# Patient Record
Sex: Female | Born: 1975 | Race: White | Hispanic: No | Marital: Married | Smoking: Never smoker
Health system: Southern US, Community
[De-identification: ages and names within clinical notes are randomized; demographics above are authoritative.]

## PROBLEM LIST (undated history)

## (undated) DIAGNOSIS — Z872 Personal history of diseases of the skin and subcutaneous tissue: Secondary | ICD-10-CM

## (undated) DIAGNOSIS — E0590 Thyrotoxicosis, unspecified without thyrotoxic crisis or storm: Secondary | ICD-10-CM

## (undated) DIAGNOSIS — R102 Pelvic and perineal pain unspecified side: Secondary | ICD-10-CM

## (undated) DIAGNOSIS — M797 Fibromyalgia: Secondary | ICD-10-CM

## (undated) DIAGNOSIS — Q796 Ehlers-Danlos syndrome, unspecified: Secondary | ICD-10-CM

## (undated) DIAGNOSIS — Z87820 Personal history of traumatic brain injury: Secondary | ICD-10-CM

## (undated) DIAGNOSIS — F329 Major depressive disorder, single episode, unspecified: Secondary | ICD-10-CM

## (undated) DIAGNOSIS — J302 Other seasonal allergic rhinitis: Secondary | ICD-10-CM

## (undated) DIAGNOSIS — L709 Acne, unspecified: Secondary | ICD-10-CM

## (undated) DIAGNOSIS — F410 Panic disorder [episodic paroxysmal anxiety] without agoraphobia: Secondary | ICD-10-CM

## (undated) DIAGNOSIS — E282 Polycystic ovarian syndrome: Secondary | ICD-10-CM

## (undated) DIAGNOSIS — F419 Anxiety disorder, unspecified: Secondary | ICD-10-CM

## (undated) DIAGNOSIS — F32A Depression, unspecified: Secondary | ICD-10-CM

## (undated) DIAGNOSIS — G43909 Migraine, unspecified, not intractable, without status migrainosus: Secondary | ICD-10-CM

## (undated) DIAGNOSIS — N809 Endometriosis, unspecified: Secondary | ICD-10-CM

## (undated) DIAGNOSIS — N3010 Interstitial cystitis (chronic) without hematuria: Secondary | ICD-10-CM

## (undated) DIAGNOSIS — M26629 Arthralgia of temporomandibular joint, unspecified side: Secondary | ICD-10-CM

## (undated) DIAGNOSIS — M1990 Unspecified osteoarthritis, unspecified site: Secondary | ICD-10-CM

## (undated) HISTORY — DX: Ehlers-Danlos syndrome, unspecified: Q79.60

## (undated) HISTORY — DX: Major depressive disorder, single episode, unspecified: F32.9

## (undated) HISTORY — DX: Unspecified osteoarthritis, unspecified site: M19.90

## (undated) HISTORY — DX: Anxiety disorder, unspecified: F41.9

## (undated) HISTORY — DX: Migraine, unspecified, not intractable, without status migrainosus: G43.909

## (undated) HISTORY — DX: Endometriosis, unspecified: N80.9

## (undated) HISTORY — DX: Thyrotoxicosis, unspecified without thyrotoxic crisis or storm: E05.90

## (undated) HISTORY — DX: Fibromyalgia: M79.7

## (undated) HISTORY — DX: Depression, unspecified: F32.A

---
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
## 2002-05-29 HISTORY — PX: WISDOM TOOTH EXTRACTION: SHX21

## 2012-02-20 ENCOUNTER — Encounter: Payer: Self-pay | Admitting: Family Medicine

## 2012-02-20 ENCOUNTER — Telehealth: Payer: Self-pay | Admitting: Family Medicine

## 2012-02-20 ENCOUNTER — Ambulatory Visit (INDEPENDENT_AMBULATORY_CARE_PROVIDER_SITE_OTHER): Payer: BC Managed Care – PPO | Admitting: Family Medicine

## 2012-02-20 VITALS — BP 120/80 | Temp 98.4°F | Ht 63.75 in | Wt 177.0 lb

## 2012-02-20 DIAGNOSIS — IMO0002 Reserved for concepts with insufficient information to code with codable children: Secondary | ICD-10-CM

## 2012-02-20 DIAGNOSIS — Z299 Encounter for prophylactic measures, unspecified: Secondary | ICD-10-CM

## 2012-02-20 DIAGNOSIS — F329 Major depressive disorder, single episode, unspecified: Secondary | ICD-10-CM

## 2012-02-20 DIAGNOSIS — F3289 Other specified depressive episodes: Secondary | ICD-10-CM

## 2012-02-20 DIAGNOSIS — S39012A Strain of muscle, fascia and tendon of lower back, initial encounter: Secondary | ICD-10-CM

## 2012-02-20 DIAGNOSIS — Z23 Encounter for immunization: Secondary | ICD-10-CM

## 2012-02-20 DIAGNOSIS — F32A Depression, unspecified: Secondary | ICD-10-CM

## 2012-02-20 LAB — LIPID PANEL
Cholesterol: 141 mg/dL (ref 0–200)
HDL: 59.5 mg/dL (ref >39.00)

## 2012-02-20 LAB — BASIC METABOLIC PANEL
BUN: 13 mg/dL (ref 6–23)
GFR: 87.32 mL/min (ref >60.00)
Potassium: 5 mEq/L (ref 3.5–5.1)

## 2012-02-20 LAB — HEMOGLOBIN A1C: Hgb A1c MFr Bld: 5.1 % (ref 4.6–6.5)

## 2012-02-20 LAB — T4, FREE: Free T4: 0.86 ng/dL (ref 0.60–1.60)

## 2012-02-20 MED ORDER — ESCITALOPRAM OXALATE 10 MG PO TABS: 10.0000 mg | ORAL_TABLET | Freq: Every day | ORAL | Status: DC

## 2012-02-20 MED ORDER — DULOXETINE HCL 30 MG PO CPEP: 30.0000 mg | ORAL_CAPSULE | Freq: Two times a day (BID) | ORAL | Status: DC

## 2012-02-20 NOTE — Progress Notes (Signed)
Chief Complaint  Patient presents with  . Establish Care    acute back pain and chronic issues- need second opinion     HPI:  Casey Cox is here to establish care and has some back pain to discuss. Moved here about 1 year ago.   PMH: Menorrhagia/PMH:  -saw a gyn doctor for this and told too much estrogen and taking prometrium since. -multiple other issues for many years with this:weight difficult to control, difficulty sleeping, thin hair, sensitive to cold and heat, occ constipation, occ depression, occ anxiety, poor memory, puffiness around eyes, joint and muscle pains, back pain, mood swings, fatigue and many other symptoms -tx for lymes - but symptoms started prior to that -has seen a number of doctors and specialists including gyn, rheum, ortho and neurology with extensive workups and thought to possibly have fibromyalgia, migraines, elevated estrogen -these symptoms are ongoing and she does want a referral to gyn and wants basic labs here  Acute back pain: -started right after moving -bilaterally lower lumbar pain -not radiating -better in flexion, worse with sitting on hard surface or carrying children -denies: fever, weight, bowel or bladder incontinence, weakness or numbness in legs  Does nanny work full time.      ROS: See pertinent positives and negatives per HPI.  Past Medical History  Diagnosis Date  . Menorrhagia   . Patellofemoral syndrome   . Fibromyalgia   . Migraine   . Arthritis   . Depression   . Migraines   . UTI (urinary tract infection)     Family History  Problem Relation Age of Onset  . Fibromyalgia Mother   . Arthritis Mother   . Diabetes Father   . Arthritis Father   . Arthritis Other   . Cancer Other     breast, prostate  . Hyperlipidemia Other   . Heart disease Other   . Hypertension Other   . Mental illness Other   . Diabetes Other     History   Social History  . Marital Status: Single    Spouse Name: N/A    Number of  Children: N/A  . Years of Education: N/A   Social History Main Topics  . Smoking status: Never Smoker   . Smokeless tobacco: Never Used  . Alcohol Use: 0.6 oz/week    1 Glasses of wine per week     occ  . Drug Use: None  . Sexually Active: Yes   Other Topics Concern  . None   Social History Narrative  . None    Current outpatient prescriptions:escitalopram (LEXAPRO) 10 MG tablet, Take 1 tablet (10 mg total) by mouth daily., Disp: 30 tablet, Rfl: 3;  progesterone (PROMETRIUM) 100 MG capsule, Take 200 mg by mouth. Take two capsules by mouth on days 14-28 of cycle., Disp: , Rfl:   EXAM:  Filed Vitals:   02/20/12 1423  BP: 120/80  Temp: 98.4 F (36.9 C)    Body mass index is 30.62 kg/(m^2).  GENERAL: vitals reviewed and listed below, alert, oriented, appears well hydrated and in no acute distress  HEENT: atraumatic, conjucntiva clear, no obvious abnormalities on inspection of external nose and ears  NECK: no masses on inspection  MS: moves all extremities without noticeable abnormality Normal gait TTP with light touch paraspinal muscle throughout > in lumbar region - facet loading tests, normal ROM in flx/ext/sidebending lumbar and thoracic spine -no PSIS, bony or piriformis TTP -neg SLRT and CLRT bilat, Neg FABER and FADIR -nor strength,  reflexes and sensation to light touch in bilar LEs  PSYCH: pleasant and cooperative, somewhat depressed mood and anxious  ASSESSMENT AND PLAN:  Discussed the following assessment and plan:  1. Preventive measure  TSH, Basic Metabolic Panel, Vitamin D, 25-hydroxy, Lipid Panel, Hemoglobin A1c, T4, Free, Ambulatory referral to Gynecology, Flu vaccine greater than or equal to 3yo preservative free IM, Tdap vaccine greater than or equal to 7yo IM, Ambulatory referral to Physical Therapy  2. Back strain  PT, OTC medications, heat ice  3. Depression  SSRI, counselling   Spent >30 minutes with this patient in face to face  counseling Depressed mood without SI or HI and likely fibromyalgia given hx and exam. Tried adding cymbalta but too expensive and will try lexapro. Back Strain: heat, ice, OTC meds, PT  Advised counseling and pt to set up this appt. Getting regular exercise and trying to eat well. Basic labs today given multiple complaints and pt to have records sent to our office for previous studies. Referral to gyn per pt request - lesbian partner and wants to get pregnant and for management of her menorrhagia and annual well women exams. Flu and tetanus vaccines today. Follow up in one month.   Orders Placed This Encounter  Procedures  . Flu vaccine greater than or equal to 3yo preservative free IM  . Tdap vaccine greater than or equal to 7yo IM  . TSH  . Basic Metabolic Panel  . Vitamin D, 25-hydroxy  . Lipid Panel  . Hemoglobin A1c  . T4, Free  . Ambulatory referral to Gynecology    Referral Priority:  Routine    Referral Type:  Consultation    Referral Reason:  Specialty Services Required    Requested Specialty:  Gynecology    Number of Visits Requested:  1  . Ambulatory referral to Physical Therapy    Referral Priority:  Routine    Referral Type:  Physical Medicine    Referral Reason:  Specialty Services Required    Requested Specialty:  Physical Therapy    Number of Visits Requested:  1    Patient Instructions  -We have ordered labs for your at this visit. It usually takes 1-2 weeks for these to result and be processed. We will contact you with instructions if your results are abnormal. Normal results will be release to your [facility] in 1-2 weeks. If you have not heard from [place] or can not find your results in [facility] in 2 weeks please contact our office.  -We placed a referral for you as discussed. It usually takes about 1-2 weeks to process and schedule this referral. If you have not heard from [place] regarding this appointment in 2 weeks please contact our office.  -As we discussed, we  have prescribed a new medication for you at this appointment. We discussed the common and serious potential adverse effects of this medication and you can review these and more with the pharmacist when you pick up your medication.  Please follow the instructions for use carefully and notify us immediately if you have any problems taking this medication.  -follow up in 1-2   Thank you for enrolling in MyChart. Please follow the instructions below to securely access your online medical record. MyChart allows you to send messages to your doctor, view your test results, renew your prescriptions, schedule appointments, and more.  How Do I Sign Up? 1. In your Internet browser, go to [email or web address] 2. Click on the New  User?  link in the Sign In box.  3. Enter your MyChart Access Code exactly as it appears below. You will not need to use this code after you have completed the sign-up process. If you do not sign up before the expiration date, you must request a new code. MyChart Access Code: [email or web address] Expires: 03/21/2012  3:14 PM  4. Enter the last four digits of your Social Security Number (xxxx) and Date of Birth (mm/dd/yyyy) as indicated and click Next. You will be taken to the next sign-up page. 5. Create a MyChart ID. This will be your MyChart login ID and cannot be changed, so think of one that is secure and easy to remember. 6. Create a MyChart password. You can change your password at any time. 7. Enter your Password Reset Question and Answer and click Next. This can be used at a later time if you forget your password.  8. Select your communication preference, and if applicable enter your e-mail address. You will receive e-mail notification when new information is available in MyChart by choosing to receive e-mail notifications and filling in your e-mail. 9. Click Sign In. You can now view your medical record.   Additional Information If you have questions, you can  email [email or web address] or call [phone] to talk to our MyChart staff. Remember, MyChart is NOT to be used for urgent needs. For medical emergencies, dial 911.            Return to clinic immediately if symptoms worsen or persist or new concerns.  No Follow-up on file.  Kriste Basque R.

## 2012-02-20 NOTE — Telephone Encounter (Signed)
Lets try another similar medication. I have sent another Rx. Thanks!  Casey Cox

## 2012-02-20 NOTE — Telephone Encounter (Signed)
Pt called and said that the DULoxetine (CYMBALTA) 30 MG capsule was $275 with pts insurance. Med is too expensive. Pt did not get med. Needs cheaper alternative. [facility] on [address]

## 2012-02-20 NOTE — Patient Instructions (Addendum)
-  We have ordered labs for your at this visit. It usually takes 1-2 weeks for these to result and be processed. We will contact you with instructions if your results are abnormal. Normal results will be release to your [facility] in 1-2 weeks. If you have not heard from [place] or can not find your results in [facility] in 2 weeks please contact our office.  -We placed a referral for you as discussed. It usually takes about 1-2 weeks to process and schedule this referral. If you have not heard from [place] regarding this appointment in 2 weeks please contact our office.  -As we discussed, we have prescribed a new medication for you at this appointment. We discussed the common and serious potential adverse effects of this medication and you can review these and more with the pharmacist when you pick up your medication.  Please follow the instructions for use carefully and notify us immediately if you have any problems taking this medication.  -follow up in 1-2   Thank you for enrolling in MyChart. Please follow the instructions below to securely access your online medical record. MyChart allows you to send messages to your doctor, view your test results, renew your prescriptions, schedule appointments, and more.  How Do I Sign Up? 1. In your Internet browser, go to [email or web address] 2. Click on the New  User? link in the Sign In box.  3. Enter your MyChart Access Code exactly as it appears below. You will not need to use this code after you have completed the sign-up process. If you do not sign up before the expiration date, you must request a new code. MyChart Access Code: [email or web address] Expires: 03/21/2012  3:14 PM  4. Enter the last four digits of your Social Security Number (xxxx) and Date of Birth (mm/dd/yyyy) as indicated and click Next. You will be taken to the next sign-up page. 5. Create a MyChart ID. This will be your MyChart login ID and cannot be changed, so think of one that is  secure and easy to remember. 6. Create a MyChart password. You can change your password at any time. 7. Enter your Password Reset Question and Answer and click Next. This can be used at a later time if you forget your password.  8. Select your communication preference, and if applicable enter your e-mail address. You will receive e-mail notification when new information is available in MyChart by choosing to receive e-mail notifications and filling in your e-mail. 9. Click Sign In. You can now view your medical record.   Additional Information If you have questions, you can email [email or web address] or call [phone] to talk to our MyChart staff. Remember, MyChart is NOT to be used for urgent needs. For medical emergencies, dial 911.

## 2012-02-20 NOTE — Telephone Encounter (Signed)
Pls advise.  

## 2012-02-21 LAB — TSH: TSH: 0.47 u[IU]/mL (ref 0.35–5.50)

## 2012-02-21 LAB — VITAMIN D 25 HYDROXY (VIT D DEFICIENCY, FRACTURES): Vit D, 25-Hydroxy: 27 ng/mL — ABNORMAL LOW (ref 30–89)

## 2012-02-21 NOTE — Telephone Encounter (Signed)
Called pt to make aware that new rx had been sent to pharmacy.

## 2012-03-21 ENCOUNTER — Ambulatory Visit (INDEPENDENT_AMBULATORY_CARE_PROVIDER_SITE_OTHER): Payer: BC Managed Care – PPO | Admitting: Family Medicine

## 2012-03-21 ENCOUNTER — Encounter: Payer: Self-pay | Admitting: Family Medicine

## 2012-03-21 VITALS — BP 118/80 | HR 87 | Temp 98.3°F | Wt 175.0 lb

## 2012-03-21 DIAGNOSIS — F32A Depression, unspecified: Secondary | ICD-10-CM

## 2012-03-21 DIAGNOSIS — R5383 Other fatigue: Secondary | ICD-10-CM

## 2012-03-21 DIAGNOSIS — M797 Fibromyalgia: Secondary | ICD-10-CM

## 2012-03-21 DIAGNOSIS — F329 Major depressive disorder, single episode, unspecified: Secondary | ICD-10-CM

## 2012-03-21 DIAGNOSIS — F3289 Other specified depressive episodes: Secondary | ICD-10-CM

## 2012-03-21 DIAGNOSIS — IMO0001 Reserved for inherently not codable concepts without codable children: Secondary | ICD-10-CM

## 2012-03-21 DIAGNOSIS — M26609 Unspecified temporomandibular joint disorder, unspecified side: Secondary | ICD-10-CM

## 2012-03-21 DIAGNOSIS — A35 Other tetanus: Secondary | ICD-10-CM

## 2012-03-21 DIAGNOSIS — R5381 Other malaise: Secondary | ICD-10-CM

## 2012-03-21 LAB — CBC WITH DIFFERENTIAL/PLATELET
Basophils Absolute: 0 10*3/uL (ref 0.0–0.1)
Eosinophils Absolute: 0.2 10*3/uL (ref 0.0–0.7)
Lymphocytes Relative: 28 % (ref 12.0–46.0)
MCHC: 33.2 g/dL (ref 30.0–36.0)
MCV: 92.7 fl (ref 78.0–100.0)
Monocytes Absolute: 0.5 10*3/uL (ref 0.1–1.0)
Neutrophils Relative %: 60.9 % (ref 43.0–77.0)
Platelets: 239 10*3/uL (ref 150.0–400.0)
RDW: 12.6 % (ref 11.5–14.6)

## 2012-03-21 NOTE — Patient Instructions (Addendum)
Insomnia: -cool dark room room reserved only for sleeping  -dont toss or turn for greater then 15-20 minutes -if tossing and turning - get up, journal thoughts, read abook (in another room) or do a light task and then attempt to go to bed again, repeat as many times as needed -wake up at the same time every day - even when you don't feel like it -try 1-5 mg of melatonin nightly 1/2 hour before bedtime or valerian root  -continue Lexapro at same dose  -counseling when can afford it  -take 2000mg  vitamin D3 daily  -We placed a referral for you for your jaw as discussed. It usually takes about 1-2 weeks to process and schedule this referral. If you have not heard from [place] regarding this appointment in 2 weeks please contact our office.  -follow up in 2 months

## 2012-03-21 NOTE — Progress Notes (Signed)
Chief Complaint  Patient presents with  . Follow-up    HPI: Follow up depression/likely fibromyalgia: -started lexapro last visit about 1 month ago -got viral upper resp infection after last visit -back feels better -has not seen counselor due to financial reasons -BMP, TSH, lipids, hemoglobin A1c all normal, Vit D low -CBC not checked  -denies: SI, fevers, weight loss, chills  Insomnia: -chronic, worsening -difficulty falling asleep and daytime drowsiness -no apnea, syncope, fevers, chills, snoring  TMD: -jaw locks - sometimes for 20 minutes -crunching and cracking sounds when opening her mouth -has a problem with clenching her jaw in the mornings - this seems to have increased a little - has seen multiple dentists and dental surgeons about this, but told this is just bad  TMJ  ROS: See pertinent positives and negatives per HPI.  Past Medical History  Diagnosis Date  . Menorrhagia   . Patellofemoral syndrome   . Fibromyalgia   . Migraine   . Arthritis   . Depression   . Migraines   . UTI (urinary tract infection)     Family History  Problem Relation Age of Onset  . Fibromyalgia Mother   . Arthritis Mother   . Diabetes Father   . Arthritis Father   . Arthritis Other   . Cancer Other     breast, prostate  . Hyperlipidemia Other   . Heart disease Other   . Hypertension Other   . Mental illness Other   . Diabetes Other     History   Social History  . Marital Status: Single    Spouse Name: N/A    Number of Children: N/A  . Years of Education: N/A   Social History Main Topics  . Smoking status: Never Smoker   . Smokeless tobacco: Never Used  . Alcohol Use: 0.6 oz/week    1 Glasses of wine per week     occ  . Drug Use: None  . Sexually Active: Yes   Other Topics Concern  . None   Social History Narrative  . None    Current outpatient prescriptions:escitalopram (LEXAPRO) 10 MG tablet, Take 1 tablet (10 mg total) by mouth daily., Disp: 30 tablet,  Rfl: 3;  Multiple Vitamin (MULTIVITAMIN) tablet, Take 1 tablet by mouth daily., Disp: , Rfl: ;  Omega-3 Fatty Acids (FISH OIL) 1200 MG CAPS, Take 2,400 mg by mouth daily., Disp: , Rfl: ;  progesterone (PROMETRIUM) 100 MG capsule, Take 200 mg by mouth. Take two capsules by mouth on days 14-28 of cycle., Disp: , Rfl:  vitamin B-12 (CYANOCOBALAMIN) 500 MCG tablet, Take 500 mcg by mouth daily., Disp: , Rfl:   EXAM:  Filed Vitals:   03/21/12 0856  BP: 118/80  Pulse: 87  Temp: 98.3 F (36.8 C)    There is no height on file to calculate BMI.  GENERAL: vitals reviewed and listed above, alert, oriented, appears well hydrated and in no acute distress  HEENT: atraumatic, conjunttiva clear, no obvious abnormalities on inspection of external nose and ears  NECK: no obvious masses on inspection  MS: moves all extremities without noticeable abnormality -crepitus TMJ joints and difficulty opening mouth  PSYCH: pleasant and cooperative, no obvious depression or anxiety - mood seems somewhat improved  ASSESSMENT AND PLAN:  Discussed the following assessment and plan:  1. Fatigue  CBC with Differential  2. Temporal mandibular joint disorder  Ambulatory referral to Oral Maxillofacial Surgery  3. Lock jaw  Ambulatory referral to Oral Maxillofacial Surgery  4. Depression    5. Fibromyalgia     -continue lexapro, see recs below -check CBC -counseling and regular exercise -referral for locking jaw and TMD -follow up in 2 months -Patient advised to return or notify a doctor immediately if symptoms worsen or persist or new concerns arise.  Patient Instructions  Insomnia: -cool dark room room reserved only for sleeping  -dont toss or turn for greater then 15-20 minutes -if tossing and turning - get up, journal thoughts, read abook (in another room) or do a light task and then attempt to go to bed again, repeat as many times as needed -wake up at the same time every day - even when you don't feel  like it -try 1-5 mg of melatonin nightly 1/2 hour before bedtime or valerian root  -continue Lexapro at same dose  -counseling when can afford it  -take 2000mg  vitamin D3 daily  -We placed a referral for you for your jaw as discussed. It usually takes about 1-2 weeks to process and schedule this referral. If you have not heard from [place] regarding this appointment in 2 weeks please contact our office.  -follow up in 2 months       [name]

## 2012-03-22 ENCOUNTER — Ambulatory Visit: Payer: BC Managed Care – PPO | Admitting: Family Medicine

## 2012-03-28 ENCOUNTER — Other Ambulatory Visit: Payer: Self-pay | Admitting: Family Medicine

## 2012-03-28 DIAGNOSIS — A35 Other tetanus: Secondary | ICD-10-CM

## 2012-03-28 DIAGNOSIS — M26609 Unspecified temporomandibular joint disorder, unspecified side: Secondary | ICD-10-CM

## 2012-04-02 ENCOUNTER — Telehealth: Payer: Self-pay | Admitting: Family Medicine

## 2012-04-02 NOTE — Telephone Encounter (Signed)
Left a message for pt to return call. Spoke with Joni Reining about referral and she states that when she called to make the appt she specifically noted that the appt was for TMJ.  Dr. Barbette Merino [phone] )treats TMJ and a referral can be placed if pt would like.

## 2012-04-02 NOTE — Telephone Encounter (Signed)
Pt called and said that she went to ENT referral appt and was charged her spec copay, but was told that they do not treat TMJ. Pt was dx with TMJ, needs a different referral to a spec that treats the condition. Pls call.

## 2012-04-03 ENCOUNTER — Other Ambulatory Visit: Payer: Self-pay | Admitting: Family Medicine

## 2012-04-03 DIAGNOSIS — A35 Other tetanus: Secondary | ICD-10-CM

## 2012-04-03 DIAGNOSIS — S0300XA Dislocation of jaw, unspecified side, initial encounter: Secondary | ICD-10-CM

## 2012-04-03 NOTE — Telephone Encounter (Signed)
New referral ordered

## 2012-04-04 ENCOUNTER — Encounter: Payer: Self-pay | Admitting: Family Medicine

## 2012-04-04 ENCOUNTER — Telehealth: Payer: Self-pay | Admitting: Family Medicine

## 2012-04-04 ENCOUNTER — Ambulatory Visit (INDEPENDENT_AMBULATORY_CARE_PROVIDER_SITE_OTHER): Payer: BC Managed Care – PPO | Admitting: Family Medicine

## 2012-04-04 VITALS — BP 100/70 | HR 137 | Temp 98.4°F | Wt 172.0 lb

## 2012-04-04 DIAGNOSIS — K5289 Other specified noninfective gastroenteritis and colitis: Secondary | ICD-10-CM

## 2012-04-04 DIAGNOSIS — E860 Dehydration: Secondary | ICD-10-CM

## 2012-04-04 DIAGNOSIS — G43909 Migraine, unspecified, not intractable, without status migrainosus: Secondary | ICD-10-CM | POA: Insufficient documentation

## 2012-04-04 DIAGNOSIS — K529 Noninfective gastroenteritis and colitis, unspecified: Secondary | ICD-10-CM

## 2012-04-04 DIAGNOSIS — M26609 Unspecified temporomandibular joint disorder, unspecified side: Secondary | ICD-10-CM | POA: Insufficient documentation

## 2012-04-04 DIAGNOSIS — M797 Fibromyalgia: Secondary | ICD-10-CM | POA: Insufficient documentation

## 2012-04-04 MED ORDER — ONDANSETRON HCL 4 MG PO TABS: 4.0000 mg | ORAL_TABLET | Freq: Three times a day (TID) | ORAL | Status: DC | PRN

## 2012-04-04 NOTE — Telephone Encounter (Signed)
Caller: [name]/Spouse; Patient Name: Casey Cox; PCP: Kriste Basque [facility]); Best Callback Phone Number: [phone].  Caller reports abdominal pain that began at 6pm on 04/03/12.  Pt also has nausea, vomiting, and diarrhea.  Caller could not estimate the number of times pt has vomited. Caller states pt hasnt kept anything down for 12hours. Pt is afebrile and  Pt has told caller that her abdominal pain is severe.  Emergent symptom of 'Unbearable abdominal/pelvic pain' identified in the Nausea or Vomiting Protocol. ED Disposition; however,  Office is open and RN able to schedule appt with provider within 40 minutes.  Appt scheduled for 9:00 with Dr Selena Batten (04/04/12).

## 2012-04-04 NOTE — Progress Notes (Signed)
Chief Complaint  Patient presents with  . severe abdominal pain    diarrhea, vomiting; not able to keep food or liquids down.     HPI:  Nausea, Vomiting, Diarrhea: -started last night -vomited multiple times, multiple episodes of watery diarrhea, severe intermittent cramping abdominal pain difuse -no fever, hematochezia or hematemesis, CP, SOB, rash, weight change -mother had exact same symptoms last week  -worse/better with: nothing  ROS: See pertinent positives and negatives per HPI.  Past Medical History  Diagnosis Date  . Menorrhagia   . Patellofemoral syndrome   . Fibromyalgia   . Migraine   . Arthritis   . Depression   . Migraines   . UTI (urinary tract infection)     Family History  Problem Relation Age of Onset  . Fibromyalgia Mother   . Arthritis Mother   . Diabetes Father   . Arthritis Father   . Arthritis Other   . Cancer Other     breast, prostate  . Hyperlipidemia Other   . Heart disease Other   . Hypertension Other   . Mental illness Other   . Diabetes Other     History   Social History  . Marital Status: Single    Spouse Name: N/A    Number of Children: N/A  . Years of Education: N/A   Social History Main Topics  . Smoking status: Never Smoker   . Smokeless tobacco: Never Used  . Alcohol Use: 0.6 oz/week    1 Glasses of wine per week     Comment: occ  . Drug Use: None  . Sexually Active: Yes   Other Topics Concern  . None   Social History Narrative  . None    Current outpatient prescriptions:escitalopram (LEXAPRO) 10 MG tablet, Take 1 tablet (10 mg total) by mouth daily., Disp: 30 tablet, Rfl: 3;  Multiple Vitamin (MULTIVITAMIN) tablet, Take 1 tablet by mouth daily., Disp: , Rfl: ;  Omega-3 Fatty Acids (FISH OIL) 1200 MG CAPS, Take 2,400 mg by mouth daily., Disp: , Rfl: ;  progesterone (PROMETRIUM) 100 MG capsule, Take 200 mg by mouth. Take two capsules by mouth on days 14-28 of cycle., Disp: , Rfl:  vitamin B-12 (CYANOCOBALAMIN)  500 MCG tablet, Take 500 mcg by mouth daily., Disp: , Rfl:   EXAM:  Filed Vitals:   04/04/12 0907  BP: 100/70  Pulse: 137  Temp: 98.4 F (36.9 C)    There is no height on file to calculate BMI.  GENERAL: vitals reviewed and listed above, alert, oriented, appears well hydrated and in no acute distress  HEENT: atraumatic, conjunttiva clear, no obvious abnormalities on inspection of external nose and ears  NECK: no obvious masses on inspection  LUNGS: clear to auscultation bilaterally, no wheezes, rales or rhonchi, good air movement  CV: HRRR, no peripheral edema  ABD: BS+, soft, no rebound or guarding, diffuse TTP  MS: moves all extremities without noticeable abnormality  PSYCH: pleasant and cooperative, no obvious depression or anxiety  ASSESSMENT AND PLAN:  Discussed the following assessment and plan: Gastroenteritis Dehydration  -discussed likely gastroenteritis and other less likely etiologies  -pt prefers not to go to ED and feel reasonable given contact with others with same symptoms and symptoms c/w gastroenteritis -1 L IVF with IM phenergan in office and patient felt better and wanted to go home -supportive tx at home with return and ED precuations   -Patient advised to return or notify a doctor immediately if symptoms worsen or persist  or new concerns arise.  There are no Patient Instructions on file for this visit.   Kriste BasqueKIM, Destyne Goodreau R.

## 2012-04-04 NOTE — Patient Instructions (Addendum)
-  drink plenty of fluids even if you feel sick - sip slowly all day  -take zofran as directed if needed  -can use loperamide for the diarrhea  -follow up if worsening or not improving

## 2012-04-09 ENCOUNTER — Encounter: Payer: Self-pay | Admitting: Family Medicine

## 2012-05-23 ENCOUNTER — Other Ambulatory Visit: Payer: Self-pay | Admitting: Oral Surgery

## 2012-05-23 DIAGNOSIS — M2669 Other specified disorders of temporomandibular joint: Secondary | ICD-10-CM

## 2012-05-24 ENCOUNTER — Other Ambulatory Visit: Payer: BC Managed Care – PPO

## 2012-05-26 ENCOUNTER — Other Ambulatory Visit: Payer: BC Managed Care – PPO

## 2012-05-29 DIAGNOSIS — M1990 Unspecified osteoarthritis, unspecified site: Secondary | ICD-10-CM

## 2012-05-29 HISTORY — DX: Unspecified osteoarthritis, unspecified site: M19.90

## 2012-06-03 ENCOUNTER — Other Ambulatory Visit: Payer: BC Managed Care – PPO

## 2012-06-06 ENCOUNTER — Inpatient Hospital Stay: Admission: RE | Admit: 2012-06-06 | Payer: BC Managed Care – PPO | Source: Ambulatory Visit

## 2012-06-11 ENCOUNTER — Ambulatory Visit
Admission: RE | Admit: 2012-06-11 | Discharge: 2012-06-11 | Disposition: A | Discharge status: Home / Self Care | Payer: BC Managed Care – PPO | Source: Ambulatory Visit | Attending: Oral Surgery | Admitting: Oral Surgery

## 2012-06-11 DIAGNOSIS — M2669 Other specified disorders of temporomandibular joint: Secondary | ICD-10-CM

## 2012-06-15 ENCOUNTER — Other Ambulatory Visit: Payer: Self-pay | Admitting: Family Medicine

## 2012-06-16 NOTE — Telephone Encounter (Signed)
Ok to refill for 1 month - advise follow up appt for further refills. Thanks.

## 2012-06-24 ENCOUNTER — Encounter: Payer: Self-pay | Admitting: Family Medicine

## 2012-06-24 ENCOUNTER — Ambulatory Visit (INDEPENDENT_AMBULATORY_CARE_PROVIDER_SITE_OTHER): Payer: BC Managed Care – PPO | Admitting: Family Medicine

## 2012-06-24 VITALS — BP 110/80 | HR 87 | Temp 98.0°F | Wt 177.0 lb

## 2012-06-24 DIAGNOSIS — F32A Depression, unspecified: Secondary | ICD-10-CM

## 2012-06-24 DIAGNOSIS — M797 Fibromyalgia: Secondary | ICD-10-CM

## 2012-06-24 DIAGNOSIS — F329 Major depressive disorder, single episode, unspecified: Secondary | ICD-10-CM

## 2012-06-24 DIAGNOSIS — M26609 Unspecified temporomandibular joint disorder, unspecified side: Secondary | ICD-10-CM

## 2012-06-24 DIAGNOSIS — F3289 Other specified depressive episodes: Secondary | ICD-10-CM

## 2012-06-24 DIAGNOSIS — IMO0001 Reserved for inherently not codable concepts without codable children: Secondary | ICD-10-CM

## 2012-06-24 MED ORDER — ESCITALOPRAM OXALATE 5 MG PO TABS: ORAL_TABLET | ORAL | Status: DC

## 2012-06-24 NOTE — Progress Notes (Signed)
Chief Complaint  Patient presents with  . Follow-up    stressed and anxious     HPI:  Follow up MMP:  Depression/?fibromyalgia/Insomnia: -per her report extensive workup in the past by rheum and neuro -started on lexapro last visit and recs for counseling -labs including BMP, TSH, lipids, CBC and Hemoglobin A1c normal, Vit D was low -recs for sleep hygeine, melatonin, counseling, exercise, 2000 IU VD3 daily 2.5 months ago -does have some anxiety about her jaw pain and locking -pt reports: depression somewhat improved, anxiety worse with her jaw, hurts all over - once per week - walking and biking daily   TMJ: -saw Dr. 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  Past Medical History  Diagnosis Date  . Menorrhagia   . Patellofemoral syndrome   . Fibromyalgia   . Migraine   . Arthritis   . Depression   . Migraines   . UTI (urinary tract infection)     Family History  Problem Relation Age of Onset  . Fibromyalgia Mother   . Arthritis Mother   . Diabetes Father   . Arthritis Father   . Arthritis Other   . Cancer Other     breast, prostate  . Hyperlipidemia Other   . Heart disease Other   . Hypertension Other   . Mental illness Other   . Diabetes Other     History   Social History  . Marital Status: Single    Spouse Name: N/A    Number of Children: N/A  . Years of Education: N/A   Social History Main Topics  . Smoking status: Never Smoker   . Smokeless tobacco: Never Used  . Alcohol Use: 0.6 oz/week    1 Glasses of wine per week     Comment: occ  . Drug Use: None  . Sexually Active: Yes   Other Topics Concern  . None   Social History Narrative   Work or School: full time [employer] Situation: lives with female partner - trying for a childSpiritual Beliefs:Lifestyle:    Current outpatient prescriptions:cholecalciferol (VITAMIN D) 1000 UNITS tablet, Take 1,000 Units by  mouth daily., Disp: , Rfl: ;  escitalopram (LEXAPRO) 10 MG tablet, TAKE ONE TABLET BY MOUTH ONE TIME DAILY, Disp: 30 tablet, Rfl: 1;  Multiple Vitamin (MULTIVITAMIN) tablet, Take 1 tablet by mouth daily., Disp: , Rfl: ;  Omega-3 Fatty Acids (FISH OIL) 1200 MG CAPS, Take 2,400 mg by mouth daily., Disp: , Rfl:  ondansetron (ZOFRAN) 4 MG tablet, Take 1 tablet (4 mg total) by mouth every 8 (eight) hours as needed for nausea., Disp: 20 tablet, Rfl: 0;  progesterone (PROMETRIUM) 100 MG capsule, Take 200 mg by mouth. Take two capsules by mouth on days 14-28 of cycle., Disp: , Rfl: ;  vitamin B-12 (CYANOCOBALAMIN) 500 MCG tablet, Take 500 mcg by mouth daily., Disp: , Rfl:   EXAM:  Filed Vitals:   06/24/12 1406  BP: 110/80  Pulse: 87  Temp: 98 F (36.7 C)    There is no height on file to calculate BMI.  GENERAL: vitals reviewed and listed above, alert, oriented, appears well hydrated and in no acute distress  HEENT: atraumatic, conjunttiva clear, no obvious abnormalities on inspection of external nose and ears  NECK: no obvious masses on inspection  LUNGS: clear to auscultation bilaterally, no wheezes, rales or rhonchi, good air movement  CV: HRRR, no peripheral edema  MS: moves all extremities without noticeable abnormality  PSYCH: pleasant and cooperative, no obvious depression or anxiety  ASSESSMENT AND PLAN:  Discussed the following assessment and plan:  1. Fibromyalgia  Ambulatory referral to Rheumatology  2. Depression    3. TMJ (temporomandibular joint syndrome)     -pt is frustrated with her symptoms of pain all over once per week and per prior workups likely fibromyalgia. She would like to see rheum as planning to get pregnant and wants to discuss tx options - referral placed.  -weaning off SSRI and melatonin as going to start trying to get pregnant - seeing ob/gyn -not many medication options at this time since going to get pregant -advised continued regular exercise, healthy  anti-inflammatory diet, counselin -Patient advised to return or notify a doctor immediately if symptoms worsen or persist or new concerns arise.  There are no Patient Instructions on file for this visit.   Kriste Basque R.

## 2012-06-24 NOTE — Patient Instructions (Addendum)
-  wean off lexapro as instructed  -stop melatonin  -see counselor  -We placed a referral for you as discussed to the Rheumatologist. It usually takes about 1-2 weeks to process and schedule this referral. If you have not heard from [place] regarding this appointment in 2 weeks please contact our office.   -follow up in 3-4 months

## 2012-07-04 HISTORY — PX: OTHER SURGICAL HISTORY: SHX169

## 2012-07-15 ENCOUNTER — Other Ambulatory Visit: Payer: Self-pay | Admitting: Family Medicine

## 2012-07-15 NOTE — Telephone Encounter (Signed)
We had tapered off this medication at her last visit as she had plans to get pregnant.

## 2012-08-09 ENCOUNTER — Encounter: Payer: Self-pay | Admitting: Family Medicine

## 2012-08-09 NOTE — Progress Notes (Signed)
Received OV note from Dr. Kellie Simmering in Rhematology. Pt seen 07/29/2012. Dx: fibromyalgia, Insomnia  Tx: Xanax at night and follow up in 5 weeks.

## 2012-09-04 ENCOUNTER — Encounter: Payer: Self-pay | Admitting: Obstetrics and Gynecology

## 2012-09-04 ENCOUNTER — Ambulatory Visit (INDEPENDENT_AMBULATORY_CARE_PROVIDER_SITE_OTHER): Payer: BC Managed Care – PPO | Admitting: Obstetrics and Gynecology

## 2012-09-04 ENCOUNTER — Inpatient Hospital Stay
Admission: RE | Admit: 2012-09-04 | Discharge: 2012-09-04 | Disposition: A | Discharge status: Home / Self Care | Payer: Self-pay | Source: Ambulatory Visit | Attending: Obstetrics and Gynecology | Admitting: Obstetrics and Gynecology

## 2012-09-04 VITALS — BP 124/70 | Ht 63.0 in | Wt 183.0 lb

## 2012-09-04 DIAGNOSIS — Z0000 Encounter for general adult medical examination without abnormal findings: Secondary | ICD-10-CM

## 2012-09-04 DIAGNOSIS — N920 Excessive and frequent menstruation with regular cycle: Secondary | ICD-10-CM

## 2012-09-04 DIAGNOSIS — Z01419 Encounter for gynecological examination (general) (routine) without abnormal findings: Secondary | ICD-10-CM

## 2012-09-04 DIAGNOSIS — R635 Abnormal weight gain: Secondary | ICD-10-CM

## 2012-09-04 LAB — POCT URINALYSIS DIPSTICK
Glucose, UA: NEGATIVE
Urobilinogen, UA: NEGATIVE

## 2012-09-04 NOTE — Addendum Note (Signed)
Addended by: Alison Murray on: 09/04/2012 10:32 AM   Modules accepted: Orders

## 2012-09-04 NOTE — Patient Instructions (Addendum)

## 2012-09-04 NOTE — Progress Notes (Signed)
37 y.o.  Married  White  female  (got married in [place] to her female partner) G0P0 here for annual exam.  Wants to get pregnant.  Has been on progesterone for two years, but ran out this month and feels bloated, has gained weight, breasts are sore.  Previous gyn dx'd progesterone deficiency and pt wants saliva test to test her level.  She was taking 100 mg P4 days 14-28.  Menses regular q 28 days.  Went on progesterone initally not to try to get pregnant, but because bleeding was so heavy, she couldn't lose weight, and emotions were labile.    Now, menses are quite heavy with clots even on her progesterone. Has been on Wellbutrin, Lexapro but they did not help either the migraines or the anxiety "there's a ticker tape constantly in my brain"  Pt states her anxiety is routed in her inability to lose weight.  Patient's last menstrual period was 08/09/2012.          Sexually active: yes female partner.  Plans to use a friend's sperm she will inject at home to try to get pregnant.     Exercising: walks her dog, works as a Social worker, goes to gym 3-4 times per week Last mammogram:   Last pap smear: 2011 History of abnormal pap: no Smoking:no never Alcohol:socially Last colonoscopy: Last Bone Density:   Last tetanus shot: Sept 2012 Tdap Last cholesterol check: not in a long time  Hgb:                Urine:    Health Maintenance  Topic Date Due  . Pap Smear  03/23/1996  . Influenza Vaccine  01/27/2013  . Tetanus/tdap  02/19/2022    Family History  Problem Relation Age of Onset  . Fibromyalgia Mother   . Arthritis Mother   . Cancer Mother     SKIN CA  . Diabetes Father   . Arthritis Father   . Mental illness Maternal Grandmother   . Cancer Maternal Grandfather     PROSTATE CA  . Diabetes Maternal Grandfather   . Diabetes Paternal Grandmother   . Hyperlipidemia Paternal Grandmother   . Hypertension Paternal Grandmother   . Hypertension Paternal Grandfather   . Arthritis Paternal  Grandfather   . Heart disease Paternal Grandfather     Patient Active Problem List  Diagnosis  . Depression  . Fibromyalgia  . TMJ (temporomandibular joint syndrome)  . Migraines    Past Medical History  Diagnosis Date  . Menorrhagia   . Patellofemoral syndrome   . Fibromyalgia   . Migraine   . Arthritis   . Depression   . Migraines   . UTI (urinary tract infection)   . Anemia     h/o anemia  . Anxiety   . Hormone disorder 2012  . Osteoarthritis 05/2012    JAW AND FACE  has temporomandibular disorder and recently had steroid injection into jaw.     Past Surgical History  Procedure Laterality Date  . Mandible fracture surgery  06/30/12    ARTHOCENTESIS OF THE JAW(tmj)  . Lasix  12/2006    LASIX OU    Allergies: Amoxicillin and Penicillins  Current Outpatient Prescriptions  Medication Sig Dispense Refill  . ALPRAZolam (XANAX) 0.5 MG tablet Take 0.5 mg by mouth once.      . Biotin (PA BIOTIN) 1000 MCG tablet Take 1,000 mcg by mouth daily.      . cholecalciferol (VITAMIN D) 1000 UNITS tablet Take  1,000 Units by mouth daily.      . Lactobacillus (ACIDOPHILUS PO) Take 25 mg by mouth daily.      . Melatonin 5 MG CAPS Take 5 mg by mouth as needed.      . Omega-3 Fatty Acids (FISH OIL) 1200 MG CAPS Take 2,400 mg by mouth daily.      Marland Kitchen PRENATAL VITAMINS PO Take by mouth.      . progesterone (PROMETRIUM) 100 MG capsule Take 200 mg by mouth. Take two capsules by mouth on days 14-28 of cycle.       No current facility-administered medications for this visit.    ROS: Pertinent items are noted in HPI.  Exam:    BP 124/70  Ht 5\' 3"  (1.6 m)  Wt 183 lb (83.008 kg)  BMI 32.43 kg/m2  LMP 08/09/2012   Wt Readings from Last 3 Encounters:  09/04/12 183 lb (83.008 kg)  06/24/12 177 lb (80.287 kg)  04/04/12 172 lb (78.019 kg)     Ht Readings from Last 3 Encounters:  09/04/12 5\' 3"  (1.6 m)  02/20/12 5' 3.75" (1.619 m)    General appearance: alert, cooperative and appears  stated age Head: Normocephalic, without obvious abnormality, atraumatic Neck: no adenopathy, supple, symmetrical, trachea midline and thyroid not enlarged, symmetric, no tenderness/mass/nodules Lungs: clear to auscultation bilaterally Breasts: Inspection negative, No nipple retraction or dimpling, No nipple discharge or bleeding, No axillary or supraclavicular adenopathy, Normal to palpation without dominant masses Diffusely tender. Heart: regular rate and rhythm Abdomen: soft, non-tender; bowel sounds normal; no masses,  no organomegaly Extremities: extremities normal, atraumatic, no cyanosis or edema Skin: Skin color, texture, turgor normal. No rashes or lesions Lymph nodes: Cervical, supraclavicular, and axillary nodes normal. No abnormal inguinal nodes palpated Neurologic: Grossly normal   Pelvic: External genitalia:  no lesions              Urethra:  normal appearing urethra with no masses, tenderness or lesions              Bartholins and Skenes: normal                 Vagina: normal appearing vagina with normal color and discharge, no lesions              Cervix: normal appearance              Pap taken: yes        Bimanual Exam:  Uterus:  uterus is normal size, shape, consistency and nontender, AF, small, mobile                                      Adnexa: normal adnexa in size, nontender and no masses                                      Rectovaginal: Confirms                                      Anus:  normal sphincter tone, no lesions  A: normal gyn exam     Menorrhagia     Weight gain, very distressing to patient     Premenstrual breast tenderness, emotional labiltiy     Fibromyalgia  Female-female relationship, desires pregnancy, plans to use a friend's sprem     History of one abnormal thyroid test with two subsequent tests normal     P: pap smear counseled on breast self exam, diet and exercise additional lab tests per EpicCare orders Check TSH, FLP,  Rubella  screen Plan PUS/SHSG d/t menorrhagia.  Discussed with patient that I am unsure the value of her progesterone therapy, and I think we need the SHSG to assess her menorrhagia.  She seems to be ovulatory since she has regular menses even off progesterone supplementation, but they are very heavy.  I rec she wait to try for pregnancy until our work up is complete.  I rec against saliva testing and "hormone" testing in cycling women in general.  Many questions answered.  This patient's biggest concern really seems to be her inability to lose weight despite a fairly intensive exercise program and what she reports as healthy eating. She has seen a dietician, and has tried many diets, including low carb.      An After Visit Summary was printed and given to the patient.

## 2012-09-05 LAB — LIPID PANEL: HDL: 69 mg/dL (ref >39)

## 2012-09-05 LAB — TSH: TSH: 0.646 u[IU]/mL (ref 0.350–4.500)

## 2012-09-05 LAB — RUBELLA SCREEN: Rubella: 6.87 Index — ABNORMAL HIGH (ref <0.90)

## 2012-09-06 ENCOUNTER — Telehealth: Payer: Self-pay | Admitting: Obstetrics and Gynecology

## 2012-09-06 LAB — IPS PAP TEST WITH HPV

## 2012-09-06 LAB — HEMOGLOBIN, FINGERSTICK: Hemoglobin, fingerstick: 13.2 g/dL (ref 12.0–16.0)

## 2012-09-06 NOTE — Telephone Encounter (Signed)
Pt. Notified of normal lab results cm

## 2012-09-06 NOTE — Telephone Encounter (Signed)
RETURNING CALL TO CAROL

## 2012-09-18 ENCOUNTER — Other Ambulatory Visit: Payer: Self-pay | Admitting: Obstetrics and Gynecology

## 2012-09-18 ENCOUNTER — Encounter: Payer: Self-pay | Admitting: Obstetrics and Gynecology

## 2012-09-18 ENCOUNTER — Ambulatory Visit: Payer: BC Managed Care – PPO

## 2012-09-18 ENCOUNTER — Other Ambulatory Visit: Payer: Self-pay | Admitting: *Deleted

## 2012-09-18 ENCOUNTER — Ambulatory Visit (INDEPENDENT_AMBULATORY_CARE_PROVIDER_SITE_OTHER): Payer: BC Managed Care – PPO | Admitting: Obstetrics and Gynecology

## 2012-09-18 DIAGNOSIS — R350 Frequency of micturition: Secondary | ICD-10-CM

## 2012-09-18 DIAGNOSIS — N920 Excessive and frequent menstruation with regular cycle: Secondary | ICD-10-CM

## 2012-09-18 DIAGNOSIS — N946 Dysmenorrhea, unspecified: Secondary | ICD-10-CM

## 2012-09-18 DIAGNOSIS — N926 Irregular menstruation, unspecified: Secondary | ICD-10-CM

## 2012-09-18 NOTE — Progress Notes (Signed)
  37 y.o.partnered female here for a pelvic ultrasound with sonohystogram and endometrial biopsy.  Indication:  Menorrhagia, intermenstrual bleeding midcycle.  Desires future fertility.  States significant dysmenorrhea.  Patient LMP 09/05/2012        SHSG:  After obtaining appropriate consent from patient, the cervix was visualized using a speculum.  Cervix demonstrated 0.4 cm area of dark blue/purple staining at 9 o'clock. Cervix was prepped with betadine.  A tenaculum  was applied to the cervix.  Dilation of the cervix was not necessary. The catheter was passed into the uterus and sterile saline introduced, with the following findings:  No intracavitary masses.  EMB:  Again after obtaining appropriate consent from patient, the speculum was reintroduced into the vagina.  The Pipelle was passed under sterile technique into the cervical os and endometrial cavity twice, and endometrial tissue was obtained and sent to pathology.    Assessment  Menorrhagia. Abnormal uterine bleeding. Dysmenorrhea. Normal intrauterine cavity. Cervix suspicious for potential endometriosis. Desire for future fertility. Urinary frequency.  Plan  Follow up EMB. After EMB results return and if normal, OK to proceed with childbearing. Patient is already on prenatal vitamins. If no conception after one year, proceed with fertility evaluation. Cervical biopsy not necessary at this time. Urine culture.

## 2012-09-18 NOTE — Patient Instructions (Addendum)
Please call if you develop fever, strong pelvic pain, or heavy bleeding.

## 2012-09-20 LAB — IPS CERVICAL/ECC/EMB/VULVAR/VAGINAL BIOPSY

## 2012-09-20 LAB — URINE CULTURE
Colony Count: NO GROWTH
Organism ID, Bacteria: NO GROWTH

## 2012-09-25 ENCOUNTER — Encounter: Payer: Self-pay | Admitting: Family Medicine

## 2012-09-25 NOTE — Progress Notes (Signed)
OV note from Rheum (Dr. Kellie Simmering) from 09/18/12 scanned in. Dx. FMS - Tx with xanax  - told pt per notes higher dose or other meds for FMS not needed.

## 2013-02-26 DIAGNOSIS — N3010 Interstitial cystitis (chronic) without hematuria: Secondary | ICD-10-CM | POA: Insufficient documentation

## 2013-04-03 ENCOUNTER — Other Ambulatory Visit: Payer: Self-pay

## 2013-04-14 ENCOUNTER — Telehealth: Payer: Self-pay | Admitting: *Deleted

## 2013-04-14 NOTE — Telephone Encounter (Signed)
Called pt and lvm advising her according to Dr Charlean Sanfilippo note. Asked the pt to return my call.

## 2013-04-14 NOTE — Telephone Encounter (Signed)
Pt returned call and I advised her that I would be calling Dr Morrison Old to make an appt for her. She understood and was ok with this.

## 2013-04-14 NOTE — Telephone Encounter (Signed)
Message copied by Alita Chyle on Mon Apr 14, 2013  9:31 AM ------      Message from: Carlus Pavlov      Created: Sun Apr 13, 2013 10:16 AM       Carollee Herter,      Per my review of the chart, this pt has an appt with me on Tue am for desired fertility (has a h/o low progesterone but ovulatory cycles). I do not manage fertility pts, so, if this is the case, she will need to see a reproductive endo:            Dr. Morrison Old      [facility]      [address] [place], [place], [address].      [phone]      Either [facility] or [facility].            If this appt is cancelled, can you add Mr Jonell Cluck in that slot?      Thank you so much!      C       ------

## 2013-04-15 ENCOUNTER — Ambulatory Visit: Payer: BC Managed Care – PPO | Admitting: Internal Medicine

## 2013-04-21 ENCOUNTER — Telehealth: Payer: Self-pay | Admitting: *Deleted

## 2013-04-21 NOTE — Telephone Encounter (Signed)
Called [facility] and scheduled an appt for pt. Appt is scheduled for Monday, Dec. 3rd at 10:00 am. Called pt and lvm advising her of the date and time of appt. Also advised pt the center would be sending her a new pt packet. Asked pt to bring any records or previous labs from her gynecologist.

## 2013-04-21 NOTE — Telephone Encounter (Signed)
Thank you, Shannon!

## 2013-04-28 ENCOUNTER — Ambulatory Visit (INDEPENDENT_AMBULATORY_CARE_PROVIDER_SITE_OTHER): Payer: BC Managed Care – PPO | Admitting: Family Medicine

## 2013-04-28 ENCOUNTER — Encounter: Payer: Self-pay | Admitting: Family Medicine

## 2013-04-28 VITALS — BP 130/82 | Temp 98.1°F | Wt 185.0 lb

## 2013-04-28 DIAGNOSIS — K529 Noninfective gastroenteritis and colitis, unspecified: Secondary | ICD-10-CM

## 2013-04-28 DIAGNOSIS — K5289 Other specified noninfective gastroenteritis and colitis: Secondary | ICD-10-CM

## 2013-04-28 DIAGNOSIS — Z23 Encounter for immunization: Secondary | ICD-10-CM

## 2013-04-28 DIAGNOSIS — M545 Low back pain, unspecified: Secondary | ICD-10-CM

## 2013-04-28 LAB — POCT URINALYSIS DIPSTICK
Blood, UA: NEGATIVE
Nitrite, UA: NEGATIVE
Protein, UA: NEGATIVE
Urobilinogen, UA: 0.2
pH, UA: 5.5

## 2013-04-28 MED ORDER — ONDANSETRON HCL 4 MG PO TABS: 4.0000 mg | ORAL_TABLET | Freq: Three times a day (TID) | ORAL | Status: DC | PRN

## 2013-04-28 NOTE — Progress Notes (Signed)
Chief Complaint  Patient presents with  . lower abdominal pain/cramps     and lower back     HPI:  Casey Cox is here for an acute visit for:  abd pain and nausea: -she has a hx of depresion, fibromyalgia, anxiety and OA -started: several days ago -symptoms: intermittent abd cramping, lower back pain, low grade temp of 99, nausea, vomiting, loose stools -denies: fevers, chills, diarrhea, hematochezia, blood in urine, urinary urgency or frequency, flank pain -her whole family had stomach bug with similar symptoms  -wants to check pregnancy test and urine -last period Nov 9ths, gets monthly, seeing doc for fertility and has had several miscarriages  ROS: See pertinent positives and negatives per HPI.  Past Medical History  Diagnosis Date  . Menorrhagia   . Patellofemoral syndrome   . Fibromyalgia   . Migraine   . Arthritis   . Depression   . Migraines   . UTI (urinary tract infection)   . Anemia     h/o anemia  . Anxiety   . Hormone disorder 2012  . Osteoarthritis 05/2012    JAW AND FACE    Past Surgical History  Procedure Laterality Date  . Mandible fracture surgery  06/30/12    ARTHOCENTESIS OF THE JAW(tmj)  . Lasix  12/2006    LASIX OU    Family History  Problem Relation Age of Onset  . Fibromyalgia Mother   . Arthritis Mother   . Cancer Mother     SKIN CA  . Diabetes Father   . Arthritis Father   . Mental illness Maternal Grandmother   . Cancer Maternal Grandfather     PROSTATE CA  . Diabetes Maternal Grandfather   . Diabetes Paternal Grandmother   . Hyperlipidemia Paternal Grandmother   . Hypertension Paternal Grandmother   . Hypertension Paternal Grandfather   . Arthritis Paternal Grandfather   . Heart disease Paternal Grandfather     History   Social History  . Marital Status: Married    Spouse Name: N/A    Number of Children: N/A  . Years of Education: N/A   Social History Main Topics  . Smoking status: Never Smoker   . Smokeless  tobacco: Never Used  . Alcohol Use: 0.6 oz/week    1 Glasses of wine per week     Comment: occ glass of wine  . Drug Use: None  . Sexual Activity: Yes    Partners: Female     Comment: partner   Other Topics Concern  . None   Social History Narrative   Work or School: full time [employer] Situation: lives with female partner - trying for a child      Spiritual Beliefs:      Lifestyle:                Current outpatient prescriptions:ALPRAZolam (XANAX) 0.5 MG tablet, Take 0.5 mg by mouth once., Disp: , Rfl: ;  Biotin (PA BIOTIN) 1000 MCG tablet, Take 1,000 mcg by mouth daily., Disp: , Rfl: ;  cholecalciferol (VITAMIN D) 1000 UNITS tablet, Take 1,000 Units by mouth daily., Disp: , Rfl: ;  Lactobacillus (ACIDOPHILUS PO), Take 25 mg by mouth daily., Disp: , Rfl:  Omega-3 Fatty Acids (FISH OIL) 1200 MG CAPS, Take 2,400 mg by mouth daily., Disp: , Rfl: ;  PRENATAL VITAMINS PO, Take by mouth., Disp: , Rfl: ;  ondansetron (ZOFRAN) 4 MG tablet, Take 1 tablet (4 mg total) by mouth  every 8 (eight) hours as needed for nausea or vomiting., Disp: 20 tablet, Rfl: 0  EXAM:  Filed Vitals:   04/28/13 0914  BP: 130/82  Temp: 98.1 F (36.7 C)    Body mass index is 32.78 kg/(m^2).  GENERAL: vitals reviewed and listed above, alert, oriented, appears well hydrated and in no acute distress  HEENT: atraumatic, conjunttiva clear, no obvious abnormalities on inspection of external nose and ears  NECK: no obvious masses on inspection  LUNGS: clear to auscultation bilaterally, no wheezes, rales or rhonchi, good air movement  CV: HRRR, no peripheral edema  ABD: BS+, soft, NTTP, no CVA TTP  MS: moves all extremities without noticeable abnormality - TTP lumbar paraspinal muscles bilat   PSYCH: pleasant and cooperative, no obvious depression or anxiety  ASSESSMENT AND PLAN:  Discussed the following assessment and plan:  Lower back pain  Need for prophylactic vaccination and  inoculation against influenza - Plan: Flu Vaccine QUAD 36+ mos PF IM (Fluarix)  Gastroenteritis - Plan: POCT urinalysis dipstick, POCT urine pregnancy, ondansetron (ZOFRAN) 4 MG tablet  -symptoms c/w likely viral gastroenteritis - especially considering multiple family members had the same illness recently - less like any urinary or pelvic disorder but will check urine and upreg per her request and advised she see her gyn doc if continued issues with pain though approaching menses as well -return precuations -zofran (risks discussed), oral hydration stressed and transmission precuations -low back pain MS per exam and adised heat and ibuprofen or tylenol -Patient advised to return or notify a doctor immediately if symptoms worsen or persist or new concerns arise.  There are no Patient Instructions on file for this visit.   Kriste Basque R.

## 2013-04-28 NOTE — Progress Notes (Signed)
Pre visit review using our clinic review tool, if applicable. No additional management support is needed unless otherwise documented below in the visit note. 

## 2013-05-02 ENCOUNTER — Other Ambulatory Visit (HOSPITAL_COMMUNITY): Payer: Self-pay | Admitting: Obstetrics & Gynecology

## 2013-05-02 DIAGNOSIS — R102 Pelvic and perineal pain: Secondary | ICD-10-CM

## 2013-05-02 DIAGNOSIS — R1110 Vomiting, unspecified: Secondary | ICD-10-CM

## 2013-05-06 ENCOUNTER — Ambulatory Visit (HOSPITAL_COMMUNITY): Payer: BC Managed Care – PPO

## 2013-05-07 ENCOUNTER — Ambulatory Visit (HOSPITAL_COMMUNITY): Payer: BC Managed Care – PPO

## 2013-05-09 ENCOUNTER — Ambulatory Visit (HOSPITAL_COMMUNITY): Payer: BC Managed Care – PPO

## 2013-05-19 ENCOUNTER — Other Ambulatory Visit: Payer: Self-pay | Admitting: Gastroenterology

## 2013-05-19 DIAGNOSIS — R1013 Epigastric pain: Secondary | ICD-10-CM

## 2013-05-19 DIAGNOSIS — R112 Nausea with vomiting, unspecified: Secondary | ICD-10-CM

## 2013-05-30 ENCOUNTER — Encounter (HOSPITAL_COMMUNITY): Payer: Self-pay

## 2013-05-30 ENCOUNTER — Ambulatory Visit (HOSPITAL_COMMUNITY)
Admission: RE | Admit: 2013-05-30 | Discharge: 2013-05-30 | Disposition: A | Discharge status: Home / Self Care | Payer: 59 | Source: Ambulatory Visit | Attending: Obstetrics & Gynecology | Admitting: Obstetrics & Gynecology

## 2013-05-30 DIAGNOSIS — N949 Unspecified condition associated with female genital organs and menstrual cycle: Secondary | ICD-10-CM | POA: Insufficient documentation

## 2013-05-30 DIAGNOSIS — Q433 Congenital malformations of intestinal fixation: Secondary | ICD-10-CM | POA: Insufficient documentation

## 2013-05-30 DIAGNOSIS — R102 Pelvic and perineal pain: Secondary | ICD-10-CM

## 2013-05-30 DIAGNOSIS — R112 Nausea with vomiting, unspecified: Secondary | ICD-10-CM | POA: Insufficient documentation

## 2013-05-30 DIAGNOSIS — R1110 Vomiting, unspecified: Secondary | ICD-10-CM

## 2013-05-30 MED ORDER — IOHEXOL 300 MG/ML  SOLN
100.0000 mL | Freq: Once | INTRAMUSCULAR | Status: AC | PRN
  Administered 2013-05-30: 100 mL via INTRAVENOUS

## 2013-06-03 ENCOUNTER — Encounter: Payer: Self-pay | Admitting: Family Medicine

## 2013-06-03 NOTE — Progress Notes (Signed)
Received office notes from Dr. Charlestine Night [facility] from 05/20/2013.  Pt seen for back pain and polyarthralgia and insomnia.  Pt Xanax and Flexeril refilled- mainly used at night for sleep.  Pt to return in 2015 for rxs follow up. Sent to scan.

## 2013-06-09 ENCOUNTER — Ambulatory Visit (HOSPITAL_COMMUNITY)
Admission: RE | Admit: 2013-06-09 | Discharge: 2013-06-09 | Disposition: A | Discharge status: Home / Self Care | Payer: 59 | Source: Ambulatory Visit | Attending: Gastroenterology | Admitting: Gastroenterology

## 2013-06-09 DIAGNOSIS — R1013 Epigastric pain: Secondary | ICD-10-CM | POA: Insufficient documentation

## 2013-06-09 DIAGNOSIS — R112 Nausea with vomiting, unspecified: Secondary | ICD-10-CM

## 2013-06-09 MED ORDER — TECHNETIUM TC 99M MEBROFENIN IV KIT: 5.5000 | PACK | Freq: Once | INTRAVENOUS | Status: AC | PRN

## 2013-06-10 ENCOUNTER — Encounter (HOSPITAL_COMMUNITY): Payer: Self-pay | Admitting: Pharmacist

## 2013-06-23 ENCOUNTER — Encounter (HOSPITAL_COMMUNITY)
Admission: RE | Admit: 2013-06-23 | Discharge: 2013-06-23 | Disposition: A | Discharge status: Home / Self Care | Payer: 59 | Source: Ambulatory Visit | Attending: Obstetrics & Gynecology | Admitting: Obstetrics & Gynecology

## 2013-06-23 ENCOUNTER — Encounter (HOSPITAL_COMMUNITY): Payer: Self-pay

## 2013-06-23 ENCOUNTER — Encounter (INDEPENDENT_AMBULATORY_CARE_PROVIDER_SITE_OTHER): Payer: Self-pay

## 2013-06-23 LAB — CBC
HEMATOCRIT: 34.2 % — AB (ref 36.0–46.0)
Hemoglobin: 12 g/dL (ref 12.0–15.0)
MCH: 30.4 pg (ref 26.0–34.0)
MCHC: 35.1 g/dL (ref 30.0–36.0)
MCV: 86.6 fL (ref 78.0–100.0)
PLATELETS: 306 10*3/uL (ref 150–400)
RBC: 3.95 MIL/uL (ref 3.87–5.11)
RDW: 12.3 % (ref 11.5–15.5)
WBC: 6.3 10*3/uL (ref 4.0–10.5)

## 2013-06-23 NOTE — Patient Instructions (Addendum)
   Your procedure is scheduled on: Tuesday, Jan 27  Enter through the Main Entrance of [facility] at: 6 AM Pick up the phone at the desk and dial [phone] and inform us of your arrival.  Please call this number if you have any problems the morning of surgery: [phone]  Remember: Do not eat or drink after midnight: Monday Take these medicines the morning of surgery with a SIP OF WATER:  Levothyroxine, xanax if needed  Do not wear jewelry, make-up, or FINGER nail polish No metal in your hair or on your body. Do not wear lotions, powders, perfumes.  You may wear deodorant.  Do not bring valuables to the hospital. Contacts, dentures or bridgework may not be worn into surgery.  Patients discharged on the day of surgery will not be allowed to drive home.  Home with wife Casey Cox cell [phone].

## 2013-06-23 NOTE — H&P (Signed)
Casey Cox is an 38 y.o. female with pelvic pain.  She has always had severe dysmenorrhea which she has controlled well with hormones.  She recently discontinued medication since she is trying to conceive.  Her pelvic pain has returned with increased intensity.  She also c/o nausea and vomiting for which she was seen by Dr. Benson Norway of GI without significant findings.  She has had a CT a/p which was normal except possible constipation and questionable malrotation of the small bowel.  She has also been trying to conceive for greater than 6 months without success.    Pertinent Gynecological History: Menses: flow is moderate Bleeding: n/a Contraception: none DES exposure: unknown Blood transfusions: none Sexually transmitted diseases: no past history Previous GYN Procedures: none  Last mammogram: n/a Date: n/a Last pap: normal Date: 08/2012 OB History: G2, P0   Menstrual History: Menarche age: n/a No LMP recorded.    Past Medical History  Diagnosis Date  . Menorrhagia   . Patellofemoral syndrome   . Fibromyalgia   . Depression   . UTI (urinary tract infection)     HX  . Anemia     h/o anemia  . Anxiety   . 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 Hypothyroidism   . Migraine     HX - last migraine 06/19/13 - does not take meds - sleep/dark room  . Migraines   . Arthritis     knees,hips  . 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 Missed abortions     x 2 - no surgery required  . Infertility     HX    Past Surgical History  Procedure Laterality Date  . Mandible fracture surgery  06/30/12    ARTHOCENTESIS OF THE JAW(tmj)  . Lasix  12/2006    LASIX OU  . Eye surgery      Lasik bilateral  . Wisdom tooth extraction      Family History  Problem Relation Age of Onset  . Fibromyalgia Mother   . Arthritis Mother   . Cancer Mother     SKIN CA  . Diabetes Father   . Arthritis Father   . Mental illness Maternal Grandmother   . Cancer Maternal Grandfather     PROSTATE CA   . Diabetes Maternal Grandfather   . Diabetes Paternal Grandmother   . Hyperlipidemia Paternal Grandmother   . Hypertension Paternal Grandmother   . Hypertension Paternal Grandfather   . Arthritis Paternal Grandfather   . Heart disease Paternal Grandfather     Social History:  reports that she has never smoked. She has never used smokeless tobacco. She reports that she drinks about 0.6 ounces of alcohol per week. She reports that she does not use illicit drugs.  Allergies:  Allergies  Allergen Reactions  . Amoxicillin   . Penicillins Rash    No prescriptions prior to admission    ROS  There were no vitals taken for this visit. Physical Exam  Constitutional: She is oriented to person, place, and time. She appears well-developed and well-nourished.  GI: Soft. There is no rebound and no guarding.  Neurological: She is alert and oriented to person, place, and time.  Skin: Skin is warm and dry.  Psychiatric: She has a normal mood and affect. Her behavior is normal.    Results for orders placed during the hospital encounter of 06/23/13 (from the past 24 hour(s))  CBC     Status: Abnormal   Collection  Time    06/23/13 12:04 PM      Result Value Range   WBC 6.3  4.0 - 10.5 K/uL   RBC 3.95  3.87 - 5.11 MIL/uL   Hemoglobin 12.0  12.0 - 15.0 g/dL   HCT 34.2 (*) 36.0 - 46.0 %   MCV 86.6  78.0 - 100.0 fL   MCH 30.4  26.0 - 34.0 pg   MCHC 35.1  30.0 - 36.0 g/dL   RDW 12.3  11.5 - 15.5 %   Platelets 306  150 - 400 K/uL    No results found.  Assessment/Plan: 38 yo with pelvic pain and infertility -Dx L/S with chromopertubation and possible ablation of endometriosis.   Casey Cox, [place] 06/23/2013, 8:03 PM

## 2013-06-24 ENCOUNTER — Encounter (HOSPITAL_COMMUNITY): Payer: 59 | Admitting: Anesthesiology

## 2013-06-24 ENCOUNTER — Encounter (HOSPITAL_COMMUNITY)
Admission: RE | Disposition: A | Discharge status: Home / Self Care | Payer: Self-pay | Source: Ambulatory Visit | Attending: Obstetrics & Gynecology

## 2013-06-24 ENCOUNTER — Encounter (HOSPITAL_COMMUNITY): Payer: Self-pay | Admitting: Anesthesiology

## 2013-06-24 ENCOUNTER — Ambulatory Visit (HOSPITAL_COMMUNITY): Payer: 59 | Admitting: Anesthesiology

## 2013-06-24 ENCOUNTER — Observation Stay (HOSPITAL_COMMUNITY)
Admission: RE | Admit: 2013-06-24 | Discharge: 2013-06-25 | Disposition: A | Discharge status: Home / Self Care | Payer: 59 | Source: Ambulatory Visit | Attending: Obstetrics & Gynecology | Admitting: Obstetrics & Gynecology

## 2013-06-24 DIAGNOSIS — N803 Endometriosis of pelvic peritoneum, unspecified: Secondary | ICD-10-CM | POA: Insufficient documentation

## 2013-06-24 DIAGNOSIS — Q433 Congenital malformations of intestinal fixation: Secondary | ICD-10-CM | POA: Insufficient documentation

## 2013-06-24 DIAGNOSIS — N979 Female infertility, unspecified: Secondary | ICD-10-CM | POA: Insufficient documentation

## 2013-06-24 DIAGNOSIS — N946 Dysmenorrhea, unspecified: Secondary | ICD-10-CM | POA: Insufficient documentation

## 2013-06-24 DIAGNOSIS — N949 Unspecified condition associated with female genital organs and menstrual cycle: Principal | ICD-10-CM | POA: Insufficient documentation

## 2013-06-24 DIAGNOSIS — R112 Nausea with vomiting, unspecified: Secondary | ICD-10-CM | POA: Insufficient documentation

## 2013-06-24 DIAGNOSIS — IMO0001 Reserved for inherently not codable concepts without codable children: Secondary | ICD-10-CM | POA: Insufficient documentation

## 2013-06-24 DIAGNOSIS — K3580 Unspecified acute appendicitis: Secondary | ICD-10-CM

## 2013-06-24 HISTORY — PX: LAPAROSCOPY: SHX197

## 2013-06-24 LAB — CBC
HEMATOCRIT: 34.2 % — AB (ref 36.0–46.0)
Hemoglobin: 12 g/dL (ref 12.0–15.0)
MCH: 30.5 pg (ref 26.0–34.0)
MCHC: 35.1 g/dL (ref 30.0–36.0)
MCV: 86.8 fL (ref 78.0–100.0)
Platelets: 261 10*3/uL (ref 150–400)
RBC: 3.94 MIL/uL (ref 3.87–5.11)
RDW: 12.3 % (ref 11.5–15.5)
WBC: 9.4 10*3/uL (ref 4.0–10.5)

## 2013-06-24 LAB — PREGNANCY, URINE: Preg Test, Ur: NEGATIVE

## 2013-06-24 SURGERY — LAPAROSCOPY OPERATIVE
Anesthesia: General | Site: Abdomen

## 2013-06-24 MED ORDER — ROCURONIUM BROMIDE 100 MG/10ML IV SOLN
INTRAVENOUS | Status: AC
  Filled 2013-06-24: qty 1

## 2013-06-24 MED ORDER — DEXAMETHASONE SODIUM PHOSPHATE 10 MG/ML IJ SOLN
INTRAMUSCULAR | Status: DC | PRN
  Administered 2013-06-24: 10 mg via INTRAVENOUS

## 2013-06-24 MED ORDER — ACETAMINOPHEN 10 MG/ML IV SOLN
1000.0000 mg | Freq: Once | INTRAVENOUS | Status: AC
  Administered 2013-06-24: 1000 mg via INTRAVENOUS
  Filled 2013-06-24: qty 100

## 2013-06-24 MED ORDER — KETOROLAC TROMETHAMINE 30 MG/ML IJ SOLN
30.0000 mg | Freq: Four times a day (QID) | INTRAMUSCULAR | Status: DC
Start: 2013-06-24 — End: 2013-06-25

## 2013-06-24 MED ORDER — MIDAZOLAM HCL 2 MG/2ML IJ SOLN
INTRAMUSCULAR | Status: AC
  Filled 2013-06-24: qty 2

## 2013-06-24 MED ORDER — KETOROLAC TROMETHAMINE 30 MG/ML IJ SOLN
30.0000 mg | Freq: Four times a day (QID) | INTRAMUSCULAR | Status: DC
  Administered 2013-06-24 – 2013-06-25 (×3): 30 mg via INTRAVENOUS
  Filled 2013-06-24 (×3): qty 1

## 2013-06-24 MED ORDER — FENTANYL CITRATE 0.05 MG/ML IJ SOLN
INTRAMUSCULAR | Status: DC | PRN
  Administered 2013-06-24 (×3): 50 ug via INTRAVENOUS
  Administered 2013-06-24: 100 ug via INTRAVENOUS
  Administered 2013-06-24 (×2): 50 ug via INTRAVENOUS

## 2013-06-24 MED ORDER — LEVOTHYROXINE SODIUM 25 MCG PO TABS
25.0000 ug | ORAL_TABLET | Freq: Every day | ORAL | Status: DC
  Administered 2013-06-25: 25 ug via ORAL
  Filled 2013-06-24: qty 1

## 2013-06-24 MED ORDER — LACTATED RINGERS IR SOLN
Status: DC | PRN
  Administered 2013-06-24: 1000 mL

## 2013-06-24 MED ORDER — FENTANYL CITRATE 0.05 MG/ML IJ SOLN
INTRAMUSCULAR | Status: AC
  Administered 2013-06-24: 50 ug via INTRAVENOUS
  Filled 2013-06-24: qty 2

## 2013-06-24 MED ORDER — OXYCODONE-ACETAMINOPHEN 5-325 MG PO TABS
1.0000 | ORAL_TABLET | ORAL | Status: DC | PRN
  Administered 2013-06-24: 1 via ORAL
  Administered 2013-06-24 – 2013-06-25 (×3): 2 via ORAL
  Filled 2013-06-24: qty 1
  Filled 2013-06-24 (×3): qty 2

## 2013-06-24 MED ORDER — SODIUM CHLORIDE 0.9 % IJ SOLN
INTRAMUSCULAR | Status: AC
  Filled 2013-06-24: qty 50

## 2013-06-24 MED ORDER — SODIUM CHLORIDE 0.9 % IJ SOLN
INTRAMUSCULAR | Status: DC | PRN
  Administered 2013-06-24: 20 mL

## 2013-06-24 MED ORDER — ONDANSETRON HCL 4 MG/2ML IJ SOLN: 4.0000 mg | Freq: Four times a day (QID) | INTRAMUSCULAR | Status: DC | PRN

## 2013-06-24 MED ORDER — LIDOCAINE HCL (CARDIAC) 20 MG/ML IV SOLN
INTRAVENOUS | Status: AC
  Filled 2013-06-24: qty 5

## 2013-06-24 MED ORDER — FENTANYL CITRATE 0.05 MG/ML IJ SOLN
INTRAMUSCULAR | Status: AC
  Filled 2013-06-24: qty 5

## 2013-06-24 MED ORDER — PROPOFOL 10 MG/ML IV BOLUS
INTRAVENOUS | Status: DC | PRN
  Administered 2013-06-24: 200 mg via INTRAVENOUS
  Administered 2013-06-24: 60 mg via INTRAVENOUS

## 2013-06-24 MED ORDER — KETOROLAC TROMETHAMINE 60 MG/2ML IM SOLN
INTRAMUSCULAR | Status: DC | PRN
  Administered 2013-06-24: 30 mg via INTRAMUSCULAR

## 2013-06-24 MED ORDER — NEOSTIGMINE METHYLSULFATE 1 MG/ML IJ SOLN
INTRAMUSCULAR | Status: AC
  Filled 2013-06-24: qty 1

## 2013-06-24 MED ORDER — ONDANSETRON HCL 4 MG PO TABS: 4.0000 mg | ORAL_TABLET | Freq: Four times a day (QID) | ORAL | Status: DC | PRN

## 2013-06-24 MED ORDER — FENTANYL CITRATE 0.05 MG/ML IJ SOLN
INTRAMUSCULAR | Status: AC
  Filled 2013-06-24: qty 2

## 2013-06-24 MED ORDER — ONDANSETRON HCL 4 MG/2ML IJ SOLN
INTRAMUSCULAR | Status: DC | PRN
  Administered 2013-06-24: 4 mg via INTRAVENOUS
  Administered 2013-06-24: 2 mg via INTRAVENOUS

## 2013-06-24 MED ORDER — BUPIVACAINE HCL (PF) 0.25 % IJ SOLN
INTRAMUSCULAR | Status: DC | PRN
  Administered 2013-06-24: 17 mL

## 2013-06-24 MED ORDER — PROPOFOL 10 MG/ML IV EMUL
INTRAVENOUS | Status: AC
  Filled 2013-06-24: qty 20

## 2013-06-24 MED ORDER — PROPOFOL INFUSION 10 MG/ML OPTIME
INTRAVENOUS | Status: DC | PRN
  Administered 2013-06-24: 160 ug/kg/min via INTRAVENOUS

## 2013-06-24 MED ORDER — DEXTROSE IN LACTATED RINGERS 5 % IV SOLN: INTRAVENOUS | Status: DC

## 2013-06-24 MED ORDER — ALUM & MAG HYDROXIDE-SIMETH 200-200-20 MG/5ML PO SUSP: 30.0000 mL | ORAL | Status: DC | PRN

## 2013-06-24 MED ORDER — MENTHOL 3 MG MT LOZG
1.0000 | LOZENGE | OROMUCOSAL | Status: DC | PRN
  Administered 2013-06-24: 3 mg via ORAL
  Filled 2013-06-24: qty 9

## 2013-06-24 MED ORDER — METHYLENE BLUE 1 % INJ SOLN
INTRAMUSCULAR | Status: DC | PRN
  Administered 2013-06-24: 5 mL

## 2013-06-24 MED ORDER — KETOROLAC TROMETHAMINE 30 MG/ML IJ SOLN
INTRAMUSCULAR | Status: DC | PRN
  Administered 2013-06-24: 30 mg via INTRAVENOUS

## 2013-06-24 MED ORDER — SUCCINYLCHOLINE CHLORIDE 20 MG/ML IJ SOLN
INTRAMUSCULAR | Status: AC
  Filled 2013-06-24: qty 10

## 2013-06-24 MED ORDER — PROPOFOL 10 MG/ML IV EMUL
INTRAVENOUS | Status: AC
  Filled 2013-06-24: qty 160

## 2013-06-24 MED ORDER — DEXAMETHASONE SODIUM PHOSPHATE 10 MG/ML IJ SOLN
INTRAMUSCULAR | Status: AC
  Filled 2013-06-24: qty 1

## 2013-06-24 MED ORDER — DOCUSATE SODIUM 100 MG PO CAPS
100.0000 mg | ORAL_CAPSULE | Freq: Two times a day (BID) | ORAL | Status: DC
  Administered 2013-06-24 – 2013-06-25 (×3): 100 mg via ORAL
  Filled 2013-06-24 (×3): qty 1

## 2013-06-24 MED ORDER — DEXTROSE IN LACTATED RINGERS 5 % IV SOLN
INTRAVENOUS | Status: DC
  Administered 2013-06-24 (×2): via INTRAVENOUS

## 2013-06-24 MED ORDER — HYDROMORPHONE HCL PF 1 MG/ML IJ SOLN
0.2000 mg | INTRAMUSCULAR | Status: DC | PRN
  Administered 2013-06-24: 0.6 mg via INTRAVENOUS
  Filled 2013-06-24: qty 1

## 2013-06-24 MED ORDER — PROPOFOL 10 MG/ML IV EMUL
INTRAVENOUS | Status: AC
  Filled 2013-06-24: qty 60

## 2013-06-24 MED ORDER — MEPERIDINE HCL 25 MG/ML IJ SOLN: 6.2500 mg | INTRAMUSCULAR | Status: DC | PRN

## 2013-06-24 MED ORDER — FENTANYL CITRATE 0.05 MG/ML IJ SOLN
25.0000 ug | INTRAMUSCULAR | Status: DC | PRN
  Administered 2013-06-24 (×4): 50 ug via INTRAVENOUS

## 2013-06-24 MED ORDER — GLYCOPYRROLATE 0.2 MG/ML IJ SOLN
INTRAMUSCULAR | Status: DC | PRN
  Administered 2013-06-24: 0.1 mg via INTRAVENOUS

## 2013-06-24 MED ORDER — ROCURONIUM BROMIDE 100 MG/10ML IV SOLN
INTRAVENOUS | Status: DC | PRN
  Administered 2013-06-24: 5 mg via INTRAVENOUS

## 2013-06-24 MED ORDER — ONDANSETRON HCL 4 MG/2ML IJ SOLN: 4.0000 mg | Freq: Once | INTRAMUSCULAR | Status: DC | PRN

## 2013-06-24 MED ORDER — ONDANSETRON HCL 4 MG/2ML IJ SOLN
INTRAMUSCULAR | Status: AC
  Filled 2013-06-24: qty 2

## 2013-06-24 MED ORDER — KETOROLAC TROMETHAMINE 30 MG/ML IJ SOLN: 15.0000 mg | Freq: Once | INTRAMUSCULAR | Status: DC | PRN

## 2013-06-24 MED ORDER — LIDOCAINE HCL (CARDIAC) 20 MG/ML IV SOLN
INTRAVENOUS | Status: DC | PRN
  Administered 2013-06-24: 80 mg via INTRAVENOUS

## 2013-06-24 MED ORDER — METHYLENE BLUE 1 % INJ SOLN
INTRAMUSCULAR | Status: AC
  Filled 2013-06-24: qty 10

## 2013-06-24 MED ORDER — LACTATED RINGERS IV SOLN
INTRAVENOUS | Status: DC
Start: 2013-06-24 — End: 2013-06-24
  Administered 2013-06-24 (×2): via INTRAVENOUS

## 2013-06-24 MED ORDER — MIDAZOLAM HCL 2 MG/2ML IJ SOLN
INTRAMUSCULAR | Status: DC | PRN
  Administered 2013-06-24 (×2): 1 mg via INTRAVENOUS
  Administered 2013-06-24: 2 mg via INTRAVENOUS

## 2013-06-24 MED ORDER — SIMETHICONE 80 MG PO CHEW
80.0000 mg | CHEWABLE_TABLET | Freq: Four times a day (QID) | ORAL | Status: DC | PRN
Start: 2013-06-24 — End: 2013-06-25
  Administered 2013-06-24 – 2013-06-25 (×2): 80 mg via ORAL
  Filled 2013-06-24 (×2): qty 1

## 2013-06-24 MED ORDER — PROMETHAZINE HCL 25 MG/ML IJ SOLN
6.2500 mg | INTRAMUSCULAR | Status: DC | PRN
  Administered 2013-06-24: 12.5 mg via INTRAVENOUS

## 2013-06-24 MED ORDER — PROMETHAZINE HCL 25 MG/ML IJ SOLN
INTRAMUSCULAR | Status: AC
  Administered 2013-06-24: 12.5 mg via INTRAVENOUS
  Filled 2013-06-24: qty 1

## 2013-06-24 MED ORDER — BUPIVACAINE HCL (PF) 0.25 % IJ SOLN
INTRAMUSCULAR | Status: AC
  Filled 2013-06-24: qty 30

## 2013-06-24 MED ORDER — SUCCINYLCHOLINE CHLORIDE 20 MG/ML IJ SOLN
INTRAMUSCULAR | Status: DC | PRN
  Administered 2013-06-24: 80 mg via INTRAVENOUS

## 2013-06-24 MED ORDER — DEXTROSE 5 % IV SOLN
2.0000 g | Freq: Once | INTRAVENOUS | Status: AC
  Administered 2013-06-24: 2 g via INTRAVENOUS
  Filled 2013-06-24: qty 2

## 2013-06-24 SURGICAL SUPPLY — 33 items
BARRIER ADHS 3X4 INTERCEED (GAUZE/BANDAGES/DRESSINGS) IMPLANT
CABLE HIGH FREQUENCY MONO STRZ (ELECTRODE) ×2 IMPLANT
CATH ROBINSON RED A/P 16FR (CATHETERS) ×2 IMPLANT
CHLORAPREP W/TINT 26ML (MISCELLANEOUS) ×2 IMPLANT
CLOTH BEACON ORANGE TIMEOUT ST (SAFETY) ×2 IMPLANT
CUTTER FLEX LINEAR 45M (STAPLE) ×2 IMPLANT
DERMABOND ADVANCED (GAUZE/BANDAGES/DRESSINGS) ×1
DERMABOND ADVANCED .7 DNX12 (GAUZE/BANDAGES/DRESSINGS) ×1 IMPLANT
ENSEAL DEVICE STD TIP 35CM (ENDOMECHANICALS) IMPLANT
FILTER SMOKE EVAC LAPAROSHD (FILTER) ×2 IMPLANT
GLOVE BIO SURGEON STRL SZ 6 (GLOVE) ×2 IMPLANT
GLOVE BIOGEL PI IND STRL 6 (GLOVE) ×2 IMPLANT
GLOVE BIOGEL PI INDICATOR 6 (GLOVE) ×2
GOWN STRL REUS W/TWL LRG LVL3 (GOWN DISPOSABLE) ×6 IMPLANT
NEEDLE INSUFFLATION 120MM (ENDOMECHANICALS) ×2 IMPLANT
NS IRRIG 1000ML POUR BTL (IV SOLUTION) ×2 IMPLANT
PACK LAPAROSCOPY BASIN (CUSTOM PROCEDURE TRAY) ×2 IMPLANT
POUCH SPECIMEN RETRIEVAL 10MM (ENDOMECHANICALS) ×2 IMPLANT
PROTECTOR NERVE ULNAR (MISCELLANEOUS) ×2 IMPLANT
RELOAD 45 VASCULAR/THIN (ENDOMECHANICALS) ×2 IMPLANT
SCALPEL HARMONIC ACE (MISCELLANEOUS) ×2 IMPLANT
SCISSORS LAP 5X35 DISP (ENDOMECHANICALS) ×2 IMPLANT
SEALER TISSUE G2 CVD JAW 45CM (ENDOMECHANICALS) IMPLANT
SET IRRIG TUBING LAPAROSCOPIC (IRRIGATION / IRRIGATOR) ×2 IMPLANT
STRIP CLOSURE SKIN 1/2X4 (GAUZE/BANDAGES/DRESSINGS) IMPLANT
SUT MNCRL AB 3-0 PS2 27 (SUTURE) ×2 IMPLANT
SUT VICRYL 0 UR6 27IN ABS (SUTURE) ×2 IMPLANT
TOWEL OR 17X24 6PK STRL BLUE (TOWEL DISPOSABLE) ×4 IMPLANT
TROCAR XCEL 12X100 BLDLESS (ENDOMECHANICALS) ×2 IMPLANT
TROCAR XCEL NON-BLD 11X100MML (ENDOMECHANICALS) ×4 IMPLANT
TROCAR XCEL NON-BLD 5MMX100MML (ENDOMECHANICALS) ×4 IMPLANT
WARMER LAPAROSCOPE (MISCELLANEOUS) ×2 IMPLANT
WATER STERILE IRR 1000ML POUR (IV SOLUTION) ×2 IMPLANT

## 2013-06-24 NOTE — Progress Notes (Signed)
No complaints.  Tolerating clears.  No n/v.  Pain well-controlled.  Foley in.    VSS.  AF.  Gen:A&O x 3 Abd: soft, nd, inc c/d/i Ext: no c/c/e  37yo POD#0 s/p Dx l/s with destruction of endometriosis, chromopertubation, enterolysis and appy -Continue pain and nausea control -D/C foley and IVF in am -Likely d/c in AM -Gen surgery following

## 2013-06-24 NOTE — OR Nursing (Signed)
Chromopertubation performed at 0835. Left fallopian tube patent(open). Right fallopian tube non patent.

## 2013-06-24 NOTE — OR Nursing (Signed)
General Surgery called again at [facility]. Talked to [place]. Said she would put out call to all on call general surgeons. 0900 Still have not had a call back. Called office back and Cyril Mourning stated that Dr. Lucia Gaskins would be here in 10-15 minutes.

## 2013-06-24 NOTE — OR Nursing (Signed)
Dr. Lucia Gaskins arrived to OR #8 at 0920 for bowel consult

## 2013-06-24 NOTE — Progress Notes (Signed)
No change to H&P. 

## 2013-06-24 NOTE — Anesthesia Preprocedure Evaluation (Signed)
Anesthesia Evaluation  Patient identified by MRN, date of birth, ID band Patient awake    Reviewed: Allergy & Precautions, H&P , NPO status , Patient's Chart, lab work & pertinent test results  Airway Mallampati: II TM Distance: >3 FB Neck ROM: full    Dental no notable dental hx. (+) Teeth Intact   Pulmonary neg pulmonary ROS,    Pulmonary exam normal       Cardiovascular negative cardio ROS      Neuro/Psych    GI/Hepatic negative GI ROS, Neg liver ROS,   Endo/Other  negative endocrine ROS  Renal/GU negative Renal ROS     Musculoskeletal   Abdominal Normal abdominal exam  (+)   Peds  Hematology   Anesthesia Other Findings   Reproductive/Obstetrics negative OB ROS                           Anesthesia Physical Anesthesia Plan  ASA: II  Anesthesia Plan: General   Post-op Pain Management:    Induction: Intravenous  Airway Management Planned: Oral ETT  Additional Equipment:   Intra-op Plan:   Post-operative Plan: Extubation in OR  Informed Consent: I have reviewed the patients History and Physical, chart, labs and discussed the procedure including the risks, benefits and alternatives for the proposed anesthesia with the patient or authorized representative who has indicated his/her understanding and acceptance.   Dental Advisory Given  Plan Discussed with: CRNA and Surgeon  Anesthesia Plan Comments:         Anesthesia Quick Evaluation

## 2013-06-24 NOTE — Anesthesia Postprocedure Evaluation (Signed)
  Anesthesia Post Note  Patient: Casey Cox  Procedure(s) Performed: Procedure(s) (LRB): LAPAROSCOPY OPERATIVE WITH CHROMOPERTUBATION, LYSIS OF ADHESIONS AND APPENDECTOMY  (N/A)  Anesthesia type: GA  Patient location: PACU  Post pain: Pain level controlled  Post assessment: Post-op Vital signs reviewed  Last Vitals:  Filed Vitals:   06/24/13 1115  BP: 103/55  Pulse:   Temp:   Resp:     Post vital signs: Reviewed  Level of consciousness: sedated  Complications: No apparent anesthesia complications

## 2013-06-24 NOTE — OR Nursing (Signed)
General Surgery called at 0820 for bowel consult. Told to call back if physician has not called within 48min

## 2013-06-24 NOTE — Anesthesia Procedure Notes (Signed)
Procedure Name: Intubation Date/Time: 06/24/2013 7:44 AM Performed by: Flossie Dibble Pre-anesthesia Checklist: Suction available, Emergency Drugs available, Timeout performed, Patient being monitored and Patient identified Patient Re-evaluated:Patient Re-evaluated prior to inductionOxygen Delivery Method: Circle system utilized Preoxygenation: Pre-oxygenation with 100% oxygen Intubation Type: IV induction Ventilation: Mask ventilation without difficulty Laryngoscope Size: Mac and 3 Grade View: Grade I Tube type: Oral Tube size: 7.0 mm Number of attempts: 1 Airway Equipment and Method: Stylet Placement Confirmation: ETT inserted through vocal cords under direct vision,  breath sounds checked- equal and bilateral and positive ETCO2 Secured at: 20 cm Tube secured with: Tape Dental Injury: Teeth and Oropharynx as per pre-operative assessment

## 2013-06-24 NOTE — Op Note (Signed)
Casey Cox 06/24/2013  PREOPERATIVE DIAGNOSIS:  Pelvic pain  POSTOPERATIVE DIAGNOSIS:  Same as above  PROCEDURE:  Diagnostic laparoscopy, fulguration of endometriosis, chromopertubation  ANESTHESIA:  General endotracheal  COMPLICATIONS:  None immediate.  ESTIMATED BLOOD LOSS:  25cc  INDICATIONS: 38 y.o. with cyclic pelvic pain which responds well to hormonal therapy. She has been trying to conceive and has had worsening of her pain off of hormones.        FINDINGS:  Normal sized and contoured uterus.  Normal ovaries bilaterally.  Mildly clubbed fallopian tubes bilaterally.  Gun powder endometriosis lesions on right uterosacral.  White and clear lesions of left uterosacral and bladder.  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  Normal appearing appendix, liver edge and gall bladder.  TECHNIQUE:  The patient was taken to the operating room where general anesthesia was obtained without difficulty.  She was then placed in the dorsal lithotomy position and prepared and draped in sterile fashion.  After an adequate timeout was performed, a bivalved speculum was then placed in the patient's vagina, and the anterior lip of cervix grasped with the single-tooth tenaculum.  The [place] manipulator was advanced into the uterus.  The speculum was removed from the vagina.  Attention was then turned to the patient's abdomen where a 10-mm skin incision was made on the umbilical fold.  The Veress needle was carefully introduced into the peritoneal cavity through the abdominal wall.  Intraperitoneal placement was confirmed by drop in intraabdominal pressure with insufflation of carbon dioxide gas.  Adequate pneumoperitoneum was obtained, and the 10/11 XL trocar was then advanced without difficulty into the abdomen where intraabdominal placement was confirmed by the operative laparoscope.  A 76mm suprapubic skin incision was made and trocar advanced under direct  visualization.  The above dictated findings were noted.  The gun powder lesion on the right uterosacral was biopsied and sent to pathology.  Using monopolar shears, the remaining endometriosis lesions were fulgurated.    Using the [facility] uterine manipulator, methylene blue was injected into the uterus.  Spill was noted only from the left fallopian tube.  No spill on the right.    General surgery was consulted intraoperatively to assess the adhesions involving the bowel and malrotation as well as the appendix.  Please see Dr. Pollie Friar operative note for details.  A third 48mm skin incision and trocar were placed in the left lower quadrant.    The infraumbilical fascial incision was closed using 2-0 Vicryl in a figure of eight stitch.  The three skin incisions were reapproximated using 3-0 monocryl.    The uterine manipulator was removed from the vagina without complications. The patient tolerated the procedure well.  Sponge, lap, and needle counts were correct times two.  The patient was then taken to the recovery room awake, extubated and in stable  in stable condition.

## 2013-06-24 NOTE — Transfer of Care (Signed)
Immediate Anesthesia Transfer of Care Note  Patient: Casey Cox  Procedure(s) Performed: Procedure(s): LAPAROSCOPY OPERATIVE WITH CHROMOPERTUBATION, LYSIS OF ADHESIONS AND APPENDECTOMY  (N/A)  Patient Location: PACU  Anesthesia Type:General  Level of Consciousness: awake, alert  and oriented  Airway & Oxygen Therapy: Patient Spontanous Breathing and Patient connected to nasal cannula oxygen  Post-op Assessment: Report given to PACU RN and Post -op Vital signs reviewed and stable  Post vital signs: Reviewed and stable  Complications: No apparent anesthesia complications

## 2013-06-24 NOTE — Progress Notes (Signed)
General Surgery Note  LOS: 0 days  POD -  Day of Surgery  Assessment/Plan: 1.  LAPAROSCOPY OPERATIVE WITH CHROMOPERTUBATION, LYSIS OF ADHESIONS AND APPENDECTOMY - M. Morris/D. 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  CT scan from 05/30/2013 suggested malrotation of the small bowel.  She is doing well tonight.  I went over in detail with the patient the operative findings of malrotation.  There is nothing else to do at this time.  For her post op course, her diet can be advanced as tolerated.  There is no limit on activity   She can see me in the office on an as needed basis.  I provided her pictures from the surgery and there are pictures in the chart (which will hopefully get scanned into Epic)  2.  Endometriosis   Active Problems:   S/P laparoscopic appendectomy   Subjective:  Doing well.  Tolerated liquids. Objective:   Filed Vitals:   06/24/13 1745  BP: 112/71  Pulse: 94  Temp: 98.2 F (36.8 C)  Resp: 18     Intake/Output from previous day:     Intake/Output this shift:  Total I/O In: 859.6 [I.V.:859.6] Out: 800 [Urine:800]   Physical Exam:   General: WN WF who is alert and oriented.    HEENT: Normal. Pupils equal. .   Lungs: Clear.   Abdomen: Soft.  The incisions look good.   Lab Results:    Recent Labs  06/23/13 1204 06/24/13 1351  WBC 6.3 9.4  HGB 12.0 12.0  HCT 34.2* 34.2*  PLT 306 261    BMET  No results found for this basename: NA, K, CL, CO2, GLUCOSE, BUN, CREATININE, CALCIUM,  in the last 72 hours  PT/INR  No results found for this basename: LABPROT, INR,  in the last 72 hours  ABG  No results found for this basename: PHART, PCO2, PO2, HCO3,  in the last 72 hours   Studies/Results:  No results found.   Anti-infectives:   Anti-infectives   Start     Dose/Rate Route Frequency Ordered Stop   06/24/13 1000  cefOXitin (MEFOXIN) 2 g in dextrose 5 % 50 mL IVPB     2 g 100 mL/hr over 30 Minutes Intravenous  Once 06/24/13  0945 06/24/13 1000      Alphonsa Overall, MD, FACS Pager: [facility] Office: [phone] 06/24/2013

## 2013-06-24 NOTE — Progress Notes (Signed)
Pt was assessed by DAwn Harvell,,Rn... Password was not working this am..

## 2013-06-25 ENCOUNTER — Encounter (HOSPITAL_COMMUNITY): Payer: Self-pay | Admitting: Obstetrics & Gynecology

## 2013-06-25 MED ORDER — IBUPROFEN 600 MG PO TABS: 600.0000 mg | ORAL_TABLET | Freq: Four times a day (QID) | ORAL | Status: DC | PRN

## 2013-06-25 MED ORDER — OXYCODONE-ACETAMINOPHEN 5-325 MG PO TABS: 1.0000 | ORAL_TABLET | ORAL | Status: DC | PRN

## 2013-06-25 NOTE — Progress Notes (Signed)
Pt teaching complete   No questions

## 2013-06-25 NOTE — Discharge Summary (Signed)
Physician Discharge Summary  Patient ID: KEBRINA FRIEND MRN: [id number] DOB/AGE: July 31, 1975 38 y.o.  Admit date: 06/24/2013 Discharge date: 06/25/2013  Admission Diagnoses:  37yo with pelvic pain  Discharge Diagnoses: SAA with endometriosis and malrotation of small bowel Active Problems:   S/P laparoscopic appendectomy   Discharged Condition: good  Hospital Course: Admitted for planned diagnostic laparoscopy and chromopertubation which was performed as planned with note of endometriosis and performance of fulguration of endometriosis.  Intraoperative consult to general surgery was placed for evaluation of appendix and bowel adhesions.  She additionally underwent appendectomy and enterolysis.  She did extremely well postoperatively and was meeting all goals on postop day #1.  She was sent home in good condition.    Consults: general surgery  Significant Diagnostic Studies: none  Treatments: analgesia: Dilaudid and surgery: Diagnostic laparoscopy with fulguration of endometriosis, chromopertubation, appendectomy and enterolysis  Discharge Exam: Blood pressure 122/70, pulse 84, temperature 98.1 F (36.7 C), temperature source Oral, resp. rate 16, height 5\' 3"  (1.6 m), weight 192 lb (87.091 kg), SpO2 100.00%. General appearance: alert, cooperative and appears stated age GI: normal findings: soft, non-tender Extremities: extremities normal, atraumatic, no cyanosis or edema Incision/Wound:c/d/i x 3  Disposition: Final discharge disposition not confirmed     Medication List    STOP taking these medications       HYDROcodone-acetaminophen 5-325 MG per tablet  Commonly known as:  NORCO/VICODIN      TAKE these medications       ACIDOPHILUS PO  Take 25 mg by mouth daily.     ALPRAZolam 0.5 MG tablet  Commonly known as:  XANAX  Take 0.5 mg by mouth once.     cholecalciferol 1000 UNITS tablet  Commonly known as:  VITAMIN D  Take 1,000 Units by mouth daily.     cyclobenzaprine 10 MG tablet  Commonly known as:  FLEXERIL  Take 20 mg by mouth at bedtime as needed for muscle spasms (and sleep prn).     Fish Oil 1200 MG Caps  Take 2,400 mg by mouth daily.     ibuprofen 600 MG tablet  Commonly known as:  ADVIL  Take 1 tablet (600 mg total) by mouth every 6 (six) hours as needed.     levothyroxine 25 MCG tablet  Commonly known as:  SYNTHROID, LEVOTHROID  Take 25 mcg by mouth daily before breakfast.     ondansetron 4 MG tablet  Commonly known as:  ZOFRAN  Take 1 tablet (4 mg total) by mouth every 8 (eight) hours as needed for nausea or vomiting.     oxyCODONE-acetaminophen 5-325 MG per tablet  Commonly known as:  PERCOCET/ROXICET  Take 1-2 tablets by mouth every 4 (four) hours as needed for severe pain (moderate to severe pain (when tolerating fluids)).     PA BIOTIN 1000 MCG tablet  Generic drug:  Biotin  Take 1,000 mcg by mouth daily.     PRENATAL VITAMINS PO  Take by mouth.         SignedLinda Hedges 06/25/2013, 9:15 AM

## 2013-06-25 NOTE — Discharge Instructions (Signed)
Call MD for T>100.4, severe abdominal pain, intractable nausea and/or vomiting, or respiratory distress.  Call office to schedule postop appointment in 2 weeks.  No driving while taking narcotics. 

## 2013-06-25 NOTE — Progress Notes (Signed)
No complaints this AM.  Tolerating po.  Ambulating well.  Voiding without difficulty.  No flatus.  Pain well controlled with po meds.  No n/v for the first time in weeks.  VSS. AF.  Gen: A&O x 3 Abd: soft, ND, inc c/d/i x 3 Ext: no c/c/e  37yo POD#1 s/p l/s destruction of endometriosis, chromopertubation, appy -D/C home today as meeting all postop goals -F/u in office in 2 weeks

## 2013-06-25 NOTE — Progress Notes (Signed)
Ur chart review completed.  

## 2013-06-26 NOTE — Op Note (Signed)
Casey Cox, Casey Cox             ACCOUNT NO.:  [id number]  MEDICAL RECORD NO.:  [id number]  LOCATION:                                 FACILITY:  PHYSICIAN:  Casey Cox. Casey Cox, M.D.  DATE OF BIRTH:  [birth year]  DATE OF PROCEDURE:  06/24/2013                               OPERATIVE REPORT   PREOPERATIVE DIAGNOSIS:  Endometriosis.  POSTOPERATIVE DIAGNOSIS:  Endometriosis, malrotation of small bowel.  SURGEON:  Casey Cox. Casey Gaskins, MD  FIRST ASSISTANT:  Dr. Linda Cox.  ANESTHESIA:  General endotracheal.  ESTIMATED BLOOD LOSS:  Minimal.  INDICATION FOR SURGERY:  Abdominal pain, pelvic pain.  HISTORY OF PRESENT ILLNESS:  Ms. Casey Cox is a 38 year old white female, who is being evaluated by Dr. Linda Cox for endometriosis.  She was undergoing a laparoscopic evaluation and endometrial ablation by Dr. Lynnette Cox at [facility], when she found evidence of a malrotation of her small bowel and asked for an intraoperative consult to participate in her care.  OPERATIVE NOTE: The patient was already under general anesthesia in the lithotomy position at [facility].  She had 2 trocars in place, an 11 mm trocar in the infraumbilical location, a 5 mm trocar in suprapubic location.  I placed an additional 5 mm trocar in the left lower quadrant for exposure and evaluation.  She had already done the fulguration of the endometriosis in her pelvis.  I was able to follow the sigmoid colon out of the pelvis.  The sigmoid colon was adhered to a very mobile right colon.  The left colon went up towards the splenic flexure, then came down and then the left transverse colon was also adhered to the very mobile right colon.  The right colon sat midline as did the appendix and to the right of the right colon, sat majority of the small bowel under a veil consistent with Ladd's bands.  I was able to identify the right and left lobes of liver, which were unremarkable.  The gallbladder was Robin's  egg blue.  A photo was taken of the gallbladder.  The appendix came off the cecum, the right colon lay in the midline with a good portion of the small bowel to the right of the right colon.  I thought the patient would be best served with a couple of things.  Rirst, because of her unusual anatomy, thought should be best served with proceeding with an appendectomy, which did increase the risk of a periopertive infection.  Wwe gave her a single dose of 2 g of cefoxitin.  I then used a Harmonic scalpel to take down the mesentery of the appendix.  I used a vascular load of the Endo GIA 45 stapler across the base of the appendix and amputated the appendix, placed in EndoCatch bag, and delivered to the umbilicus without difficulty.  I then turned my attention to the adhesions she had, 2 particular sets of adhesions 1 between the right colon/cecum and the sigmoid colon, which I took down with a Harmonic Scalpel without difficulty.  The second set of adhesions were between the right colon and the left transverse colon, which I also took down with a Harmonic scalpel without  difficulty.  Photos were taken of both of these adhesion areas before I divided and placed in the chart.  Also gave copies of the photos to the patient.  At this point, the right colon was much more mobile and not tethered to the sigmoid colon.  The left colon is also, I think, freed up to the sigmoid colon and left colon.  I do not know whether this was actually cause of any symptoms.  She had no evidence of any dilated bowel or suspicion for obstruction.  She also had no obvious rent in the adhesions/mesentery that would lead to an internal hernia that I could see.  I did run the small bowel back. The small bowel itself was otherwise unremarkable without any evidence of internal hernias or adhesions.  At this point, I thought I had completed the laparoscopic evaluation with enterolysis in the laparoscopic appendectomy.  Dr. Lynnette Cox was going to  close the incisions, take care of closure of the fascia and skin and will dictate her portion of the note.  The evening of the surgery, I did sit down with the patient went over thoroughly the general surgery findings with the patient.   Casey Cox. Casey Cox, M.D., FACS   DHN/MEDQ  D:  06/24/2013  T:  06/25/2013  Job:  [id number]  cc:   Casey Cox. Casey Norway, MD Dr. Linda Cox

## 2013-06-28 ENCOUNTER — Encounter (HOSPITAL_COMMUNITY): Payer: Self-pay | Admitting: *Deleted

## 2013-06-28 ENCOUNTER — Inpatient Hospital Stay (HOSPITAL_COMMUNITY)
Admission: AD | Admit: 2013-06-28 | Discharge: 2013-06-28 | Disposition: A | Discharge status: Home / Self Care | Payer: 59 | Source: Ambulatory Visit | Attending: Obstetrics and Gynecology | Admitting: Obstetrics and Gynecology

## 2013-06-28 ENCOUNTER — Inpatient Hospital Stay (HOSPITAL_COMMUNITY): Payer: 59

## 2013-06-28 DIAGNOSIS — D1809 Hemangioma of other sites: Secondary | ICD-10-CM | POA: Insufficient documentation

## 2013-06-28 DIAGNOSIS — J90 Pleural effusion, not elsewhere classified: Secondary | ICD-10-CM | POA: Insufficient documentation

## 2013-06-28 DIAGNOSIS — K5900 Constipation, unspecified: Secondary | ICD-10-CM

## 2013-06-28 DIAGNOSIS — R1110 Vomiting, unspecified: Secondary | ICD-10-CM | POA: Insufficient documentation

## 2013-06-28 DIAGNOSIS — K7689 Other specified diseases of liver: Secondary | ICD-10-CM | POA: Insufficient documentation

## 2013-06-28 DIAGNOSIS — Z9889 Other specified postprocedural states: Secondary | ICD-10-CM | POA: Insufficient documentation

## 2013-06-28 DIAGNOSIS — N8300 Follicular cyst of ovary, unspecified side: Secondary | ICD-10-CM | POA: Insufficient documentation

## 2013-06-28 LAB — CBC WITH DIFFERENTIAL/PLATELET
Basophils Absolute: 0 10*3/uL (ref 0.0–0.1)
Basophils Relative: 0 % (ref 0–1)
EOS ABS: 0.1 10*3/uL (ref 0.0–0.7)
EOS PCT: 1 % (ref 0–5)
HCT: 37.6 % (ref 36.0–46.0)
Hemoglobin: 13.6 g/dL (ref 12.0–15.0)
LYMPHS ABS: 1 10*3/uL (ref 0.7–4.0)
Lymphocytes Relative: 9 % — ABNORMAL LOW (ref 12–46)
MCH: 30.6 pg (ref 26.0–34.0)
MCHC: 36.2 g/dL — ABNORMAL HIGH (ref 30.0–36.0)
MCV: 84.7 fL (ref 78.0–100.0)
MONO ABS: 0.7 10*3/uL (ref 0.1–1.0)
Monocytes Relative: 6 % (ref 3–12)
Neutro Abs: 10 10*3/uL — ABNORMAL HIGH (ref 1.7–7.7)
Neutrophils Relative %: 84 % — ABNORMAL HIGH (ref 43–77)
PLATELETS: 251 10*3/uL (ref 150–400)
RBC: 4.44 MIL/uL (ref 3.87–5.11)
RDW: 12.1 % (ref 11.5–15.5)
WBC: 11.8 10*3/uL — ABNORMAL HIGH (ref 4.0–10.5)

## 2013-06-28 LAB — URINALYSIS, ROUTINE W REFLEX MICROSCOPIC
GLUCOSE, UA: NEGATIVE mg/dL
HGB URINE DIPSTICK: NEGATIVE
Ketones, ur: NEGATIVE mg/dL
Leukocytes, UA: NEGATIVE
Nitrite: NEGATIVE
PH: 8.5 — AB (ref 5.0–8.0)
PROTEIN: NEGATIVE mg/dL
Specific Gravity, Urine: 1.02 (ref 1.005–1.030)
Urobilinogen, UA: 1 mg/dL (ref 0.0–1.0)

## 2013-06-28 LAB — COMPREHENSIVE METABOLIC PANEL
ALT: 129 U/L — ABNORMAL HIGH (ref 0–35)
AST: 87 U/L — ABNORMAL HIGH (ref 0–37)
Albumin: 3.8 g/dL (ref 3.5–5.2)
Alkaline Phosphatase: 106 U/L (ref 39–117)
BUN: 9 mg/dL (ref 6–23)
CALCIUM: 9.7 mg/dL (ref 8.4–10.5)
CO2: 26 mEq/L (ref 19–32)
CREATININE: 0.66 mg/dL (ref 0.50–1.10)
Chloride: 102 mEq/L (ref 96–112)
GFR calc non Af Amer: >90 mL/min (ref >90)
GLUCOSE: 121 mg/dL — AB (ref 70–99)
Potassium: 4 mEq/L (ref 3.7–5.3)
SODIUM: 142 meq/L (ref 137–147)
TOTAL PROTEIN: 7.1 g/dL (ref 6.0–8.3)
Total Bilirubin: 0.5 mg/dL (ref 0.3–1.2)

## 2013-06-28 MED ORDER — SODIUM CHLORIDE 0.9 % IV SOLN
INTRAVENOUS | Status: DC
  Administered 2013-06-28: 11:00:00 via INTRAVENOUS

## 2013-06-28 MED ORDER — IOHEXOL 300 MG/ML  SOLN
100.0000 mL | Freq: Once | INTRAMUSCULAR | Status: AC | PRN
  Administered 2013-06-28: 100 mL via INTRAVENOUS

## 2013-06-28 MED ORDER — FLEET ENEMA 7-19 GM/118ML RE ENEM
1.0000 | ENEMA | Freq: Once | RECTAL | Status: AC
  Administered 2013-06-28: 1 via RECTAL

## 2013-06-28 MED ORDER — HYDROMORPHONE HCL PF 1 MG/ML IJ SOLN
0.5000 mg | Freq: Once | INTRAMUSCULAR | Status: AC
  Administered 2013-06-28: 0.5 mg via INTRAVENOUS
  Filled 2013-06-28: qty 1

## 2013-06-28 MED ORDER — FAMOTIDINE IN NACL 20-0.9 MG/50ML-% IV SOLN
20.0000 mg | Freq: Once | INTRAVENOUS | Status: AC
  Administered 2013-06-28: 20 mg via INTRAVENOUS
  Filled 2013-06-28: qty 50

## 2013-06-28 NOTE — MAU Note (Signed)
Pt states had surgery Tuesday, had surgery for endometriosis, noted multiple adhesions and malposition of bowel, general surgeon called in, also had appendix removed. Unable to have bowel movement since Monday. Now unable to pass flatus today. Feeling very nauseated. Urine extremely concentrated. Unable to keep foods down.

## 2013-06-28 NOTE — Discharge Instructions (Signed)

## 2013-06-28 NOTE — MAU Provider Note (Signed)
History     CSN: [id number]  Arrival date and time: 06/28/13 2620   First Provider Initiated Contact with Patient 06/28/13 1015      Chief Complaint  Patient presents with  . Post-op Problem   HPI This is a 38 y.o. female who is postoperative from 06/24/13. She states she has not been able to pass gas or have a BM since. Vomits whatever she eats or drinks. Has bloating and pain. No fever.   RN Note; Pt states had surgery Tuesday, had surgery for endometriosis, noted multiple adhesions and malposition of bowel, general surgeon called in, also had appendix removed. Unable to have bowel movement since Monday. Now unable to pass flatus today. Feeling very nauseated. Urine extremely concentrated. Unable to keep foods down.        OB History   Grav Para Term Preterm Abortions TAB SAB Ect Mult Living   2    2  2    0      Past Medical History  Diagnosis Date  . Menorrhagia   . Patellofemoral syndrome   . Fibromyalgia   . Depression   . UTI (urinary tract infection)     HX  . Anemia     h/o anemia  . Anxiety   . 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 Hypothyroidism   . Migraine     HX - last migraine 06/19/13 - does not take meds - sleep/dark room  . Migraines   . Arthritis     knees,hips  . 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 Missed abortions     x 2 - no surgery required  . Infertility     HX    Past Surgical History  Procedure Laterality Date  . Mandible fracture surgery  06/30/12    ARTHOCENTESIS OF THE JAW(tmj)  . Lasix  12/2006    LASIX OU  . Eye surgery      Lasik bilateral  . Wisdom tooth extraction    . Laparoscopy N/A 06/24/2013    Procedure: LAPAROSCOPY OPERATIVE WITH CHROMOPERTUBATION, LYSIS OF ADHESIONS AND APPENDECTOMY ;  Surgeon: Mitchel Honour, DO;  Location: [facility];  Service: Gynecology;  Laterality: N/A;    Family History  Problem Relation Age of Onset  . Fibromyalgia Mother   . Arthritis Mother   . Cancer Mother     SKIN CA  . Diabetes  Father   . Arthritis Father   . Mental illness Maternal Grandmother   . Cancer Maternal Grandfather     PROSTATE CA  . Diabetes Maternal Grandfather   . Diabetes Paternal Grandmother   . Hyperlipidemia Paternal Grandmother   . Hypertension Paternal Grandmother   . Hypertension Paternal Grandfather   . Arthritis Paternal Grandfather   . Heart disease Paternal Grandfather     History  Substance Use Topics  . Smoking status: Never Smoker   . Smokeless tobacco: Never Used  . Alcohol Use: 0.6 oz/week    1 Glasses of wine per week     Comment: occ glass of wine    Allergies:  Allergies  Allergen Reactions  . Amoxicillin Rash  . Penicillins Rash    Prescriptions prior to admission  Medication Sig Dispense Refill  . ALPRAZolam (XANAX) 0.5 MG tablet Take 0.5 mg by mouth at bedtime as needed for sleep.       Marland Kitchen aspirin-acetaminophen-caffeine (EXCEDRIN MIGRAINE) 250-250-65 MG per tablet Take 2 tablets by mouth once. Pt took and then vomited.      Marland Kitchen  Biotin (PA BIOTIN) 1000 MCG tablet Take 1,000 mcg by mouth daily.      . cholecalciferol (VITAMIN D) 1000 UNITS tablet Take 1,000 Units by mouth daily.      . cyclobenzaprine (FLEXERIL) 10 MG tablet Take 20 mg by mouth at bedtime as needed for muscle spasms (and sleep prn).      Marland Kitchen ibuprofen (ADVIL,MOTRIN) 200 MG tablet Take 600 mg by mouth every 8 (eight) hours as needed for cramping.      . Lactobacillus (ACIDOPHILUS PO) Take 25 mg by mouth daily.      Marland Kitchen levothyroxine (SYNTHROID, LEVOTHROID) 25 MCG tablet Take 25 mcg by mouth daily before breakfast.      . Linaclotide (LINZESS) 145 MCG CAPS capsule Take 145 mcg by mouth once.      . Omega-3 Fatty Acids (FISH OIL) 1200 MG CAPS Take 2,400 mg by mouth daily.      . ondansetron (ZOFRAN) 4 MG tablet Take 1 tablet (4 mg total) by mouth every 8 (eight) hours as needed for nausea or vomiting.  20 tablet  0  . oxyCODONE-acetaminophen (PERCOCET/ROXICET) 5-325 MG per tablet Take 1-2 tablets by mouth  every 4 (four) hours as needed for severe pain (moderate to severe pain (when tolerating fluids)).  30 tablet  0  . Prenatal Vit-Fe Fumarate-FA (PRENATAL MULTIVITAMIN) TABS tablet Take 1 tablet by mouth daily at 12 noon.      . Sennosides (EX-LAX PO) Take 2 tablets by mouth once.        Review of Systems  Constitutional: Positive for malaise/fatigue. Negative for fever and chills.  Gastrointestinal: Positive for heartburn, nausea, vomiting, abdominal pain and constipation. Negative for diarrhea.  Genitourinary: Negative for dysuria.  Musculoskeletal: Negative for myalgias.  Neurological: Negative for dizziness and headaches.   Physical Exam   Blood pressure 126/94, pulse 115, temperature 97.6 F (36.4 C), temperature source Oral, resp. rate 20, last menstrual period 06/21/2013, SpO2 99.00%.  Physical Exam  Constitutional: She is oriented to person, place, and time. She appears well-developed and well-nourished. No distress.  HENT:  Head: Normocephalic.  Cardiovascular: Normal rate.   Respiratory: Effort normal.  GI: Soft. She exhibits distension. She exhibits no mass. There is tenderness. There is guarding. There is no rebound.  Genitourinary:  States has brown discharge, exam deferred  Musculoskeletal: Normal range of motion.  Neurological: She is alert and oriented to person, place, and time.  Skin: Skin is warm and dry.  Psychiatric: She has a normal mood and affect.    MAU Course  Procedures  MDM Discussed with Dr Helane Rima.  Will check labs and abd CT without PO contrast. >>> Results for orders placed during the hospital encounter of 06/28/13 (from the past 24 hour(s))  URINALYSIS, ROUTINE W REFLEX MICROSCOPIC     Status: Abnormal   Collection Time    06/28/13  9:01 AM      Result Value Range   Color, Urine YELLOW  YELLOW   APPearance CLEAR  CLEAR   Specific Gravity, Urine 1.020  1.005 - 1.030   pH 8.5 (*) 5.0 - 8.0   Glucose, UA NEGATIVE  NEGATIVE mg/dL   Hgb urine  dipstick NEGATIVE  NEGATIVE   Bilirubin Urine SMALL (*) NEGATIVE   Ketones, ur NEGATIVE  NEGATIVE mg/dL   Protein, ur NEGATIVE  NEGATIVE mg/dL   Urobilinogen, UA 1.0  0.0 - 1.0 mg/dL   Nitrite NEGATIVE  NEGATIVE   Leukocytes, UA NEGATIVE  NEGATIVE  CBC WITH DIFFERENTIAL  Status: Abnormal   Collection Time    06/28/13 10:40 AM      Result Value Range   WBC 11.8 (*) 4.0 - 10.5 K/uL   RBC 4.44  3.87 - 5.11 MIL/uL   Hemoglobin 13.6  12.0 - 15.0 g/dL   HCT 37.6  36.0 - 46.0 %   MCV 84.7  78.0 - 100.0 fL   MCH 30.6  26.0 - 34.0 pg   MCHC 36.2 (*) 30.0 - 36.0 g/dL   RDW 12.1  11.5 - 15.5 %   Platelets 251  150 - 400 K/uL   Neutrophils Relative % 84 (*) 43 - 77 %   Neutro Abs 10.0 (*) 1.7 - 7.7 K/uL   Lymphocytes Relative 9 (*) 12 - 46 %   Lymphs Abs 1.0  0.7 - 4.0 K/uL   Monocytes Relative 6  3 - 12 %   Monocytes Absolute 0.7  0.1 - 1.0 K/uL   Eosinophils Relative 1  0 - 5 %   Eosinophils Absolute 0.1  0.0 - 0.7 K/uL   Basophils Relative 0  0 - 1 %   Basophils Absolute 0.0  0.0 - 0.1 K/uL  COMPREHENSIVE METABOLIC PANEL     Status: Abnormal   Collection Time    06/28/13 10:40 AM      Result Value Range   Sodium 142  137 - 147 mEq/L   Potassium 4.0  3.7 - 5.3 mEq/L   Chloride 102  96 - 112 mEq/L   CO2 26  19 - 32 mEq/L   Glucose, Bld 121 (*) 70 - 99 mg/dL   BUN 9  6 - 23 mg/dL   Creatinine, Ser 0.66  0.50 - 1.10 mg/dL   Calcium 9.7  8.4 - 10.5 mg/dL   Total Protein 7.1  6.0 - 8.3 g/dL   Albumin 3.8  3.5 - 5.2 g/dL   AST 87 (*) 0 - 37 U/L   ALT 129 (*) 0 - 35 U/L   Alkaline Phosphatase 106  39 - 117 U/L   Total Bilirubin 0.5  0.3 - 1.2 mg/dL   GFR calc non Af Amer >90  >90 mL/min   GFR calc Af Amer >90  >90 mL/min   Ct Abdomen Pelvis W Contrast  06/28/2013   CLINICAL DATA:  Four days post laparoscopic surgery for endometriosis and appendectomy. Patient complains of severe nausea and vomiting and has not had a bowel movement in 5 days.  EXAM: CT ABDOMEN AND PELVIS WITH  CONTRAST  TECHNIQUE: Multidetector CT imaging of the abdomen and pelvis was performed using the standard protocol following bolus administration of intravenous contrast.  CONTRAST:  131mL OMNIPAQUE IOHEXOL 300 MG/ML IV. Oral contrast was not administered.  COMPARISON:  05/30/2013.    FINDINGS: Small amount of residual free intraperitoneal air and gas in the subcutaneous fat at the sites of the laparoscopic ports, not unexpected. Surgical suture material at the appendectomy site. No abnormal fluid collection to suggest abscess. Minimal free fluid which localizes to the left adnexum. No significant ascites.  Stable 7 mm low-attenuation lesion in the left lobe of the liver, Hounsfield measurements approximating 30. Liver otherwise normal in appearance. Normal-appearing spleen, pancreas, adrenal glands, kidneys, and gallbladder. No biliary ductal dilation. No visible atherosclerosis. No significant lymphadenopathy.  Stomach normal in appearance. Normal-appearing small bowel loops lateral to the ascending colon. Entire small bowel normal in appearance. Large stool burden throughout the colon from cecum to rectum, particularly the rectum and sigmoid colon.  Normal-appearing uterus. Normal ovaries containing multiple small follicular cysts. Urinary bladder decompressed and unremarkable.  Bone window images unremarkable. Visualized lung bases clear apart from the expected dependent atelectasis posteriorly. Very small right pleural effusion. Normal heart size.    IMPRESSION: 1. No unexpected findings in the abdomen or pelvis after laparoscopic surgery for endometriosis and appendectomy. Very small amount of residual free intraperitoneal air and minimal fluid in the left adnexum. No evidence of abscess. 2. 7 mm lesion in the left lobe of the liver, statistically a small cyst or hemangioma. 3. Very small right pleural effusion.     Electronically Signed   By: Evangeline Dakin M.D.   On: 06/28/2013 13:00    Assessment  and Plan  A:  Postoperative s/p laparoscopy with bowel surgery      Constipation, with vomiting        P:  Discussed findings with Dr Helane Rima and Dr Donne Hazel      Will give Fleets enema to try to start process of moving bowel contents.>>> excellent results, pt feels much better      Discharge home afterward.       Follow up in office  [facility] 06/28/2013, 10:24 AM

## 2013-09-22 ENCOUNTER — Encounter: Payer: Self-pay | Admitting: Family Medicine

## 2013-09-22 ENCOUNTER — Ambulatory Visit (INDEPENDENT_AMBULATORY_CARE_PROVIDER_SITE_OTHER): Payer: 59 | Admitting: Family Medicine

## 2013-09-22 VITALS — BP 120/70 | HR 96 | Temp 98.3°F | Ht 63.0 in | Wt 185.0 lb

## 2013-09-22 DIAGNOSIS — F411 Generalized anxiety disorder: Secondary | ICD-10-CM

## 2013-09-22 DIAGNOSIS — F3289 Other specified depressive episodes: Secondary | ICD-10-CM

## 2013-09-22 DIAGNOSIS — M797 Fibromyalgia: Secondary | ICD-10-CM

## 2013-09-22 DIAGNOSIS — R109 Unspecified abdominal pain: Secondary | ICD-10-CM

## 2013-09-22 DIAGNOSIS — F32A Depression, unspecified: Secondary | ICD-10-CM

## 2013-09-22 DIAGNOSIS — F329 Major depressive disorder, single episode, unspecified: Secondary | ICD-10-CM

## 2013-09-22 DIAGNOSIS — R112 Nausea with vomiting, unspecified: Secondary | ICD-10-CM

## 2013-09-22 DIAGNOSIS — IMO0001 Reserved for inherently not codable concepts without codable children: Secondary | ICD-10-CM

## 2013-09-22 LAB — IGA: IgA: 129 mg/dL (ref 68–378)

## 2013-09-22 NOTE — Patient Instructions (Signed)
-  We have ordered labs or studies at this visit. It can take up to 1-2 weeks for results and processing. We will contact you with instructions IF your results are abnormal. Normal results will be released to your [facility]. If you have not heard from [place] or can not find your results in [facility] in 2 weeks please contact our office.  -We placed a referral for you as discussed to the gastroenterologist and to the psychiatrist. It usually takes about 1-2 weeks to process and schedule this referral. If you have not heard from [place] regarding this appointment in 2 weeks please contact our office.   Follow up in: as needed

## 2013-09-22 NOTE — Progress Notes (Signed)
No chief complaint on file.   HPI:  Multiple chronic issues -long hx of ab pain, nausea, vomiting, loose stools - saw Dr. Benson Norway in GI and had workup (reports not tested for celiacs) -had lap exploratory surgery with gyn for this with endometriosis and adhesions and appendix removed 05/2013 -she is here today primarily for celiac screening -mother recently diagnosed with celiacs -reports needs another referral to the gastroenterologist for her insurance -also anxiety and depression and on xanax for panic attacks related to longstanding health issues   ROS: See pertinent positives and negatives per HPI.  Past Medical History  Diagnosis Date  . Menorrhagia   . Patellofemoral syndrome   . Fibromyalgia   . Depression   . UTI (urinary tract infection)     HX  . Anemia     h/o anemia  . Anxiety   . 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 Hypothyroidism   . Migraine     HX - last migraine 06/19/13 - does not take meds - sleep/dark room  . Migraines   . Arthritis     knees,hips  . 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 Missed abortions     x 2 - no surgery required  . Infertility     HX    Past Surgical History  Procedure Laterality Date  . Mandible fracture surgery  06/30/12    ARTHOCENTESIS OF THE JAW(tmj)  . Lasix  12/2006    LASIX OU  . Eye surgery      Lasik bilateral  . Wisdom tooth extraction    . Laparoscopy N/A 06/24/2013    Procedure: LAPAROSCOPY OPERATIVE WITH CHROMOPERTUBATION, LYSIS OF ADHESIONS AND APPENDECTOMY ;  Surgeon: Linda Hedges, DO;  Location: [facility];  Service: Gynecology;  Laterality: N/A;    Family History  Problem Relation Age of Onset  . Fibromyalgia Mother   . Arthritis Mother   . Cancer Mother     SKIN CA  . Diabetes Father   . Arthritis Father   . Mental illness Maternal Grandmother   . Cancer Maternal Grandfather     PROSTATE CA  . Diabetes Maternal Grandfather   . Diabetes Paternal Grandmother   . Hyperlipidemia Paternal Grandmother    . Hypertension Paternal Grandmother   . Hypertension Paternal Grandfather   . Arthritis Paternal Grandfather   . Heart disease Paternal Grandfather     History   Social History  . Marital Status: Married    Spouse Name: N/A    Number of Children: N/A  . Years of Education: N/A   Social History Main Topics  . Smoking status: Never Smoker   . Smokeless tobacco: Never Used  . Alcohol Use: 0.6 oz/week    1 Glasses of wine per week     Comment: occ glass of wine  . Drug Use: No  . Sexual Activity: Yes    Partners: Female    Patent examiner Protection: None     Comment: partner   Other Topics Concern  . None   Social History Narrative   Work or School: full time [employer] Situation: lives with female partner - trying for a child      Spiritual Beliefs:      Lifestyle:                Current outpatient prescriptions:ALPRAZolam (XANAX) 0.5 MG tablet, Take 0.5 mg by mouth at bedtime as needed for sleep. ,  Disp: , Rfl: ;  aspirin-acetaminophen-caffeine (EXCEDRIN MIGRAINE) 250-250-65 MG per tablet, Take 2 tablets by mouth once. Pt took and then vomited., Disp: , Rfl: ;  cyclobenzaprine (FLEXERIL) 10 MG tablet, Take 20 mg by mouth at bedtime as needed for muscle spasms (and sleep prn)., Disp: , Rfl:  ibuprofen (ADVIL,MOTRIN) 200 MG tablet, Take 600 mg by mouth every 8 (eight) hours as needed for cramping., Disp: , Rfl: ;  Lactobacillus (ACIDOPHILUS PO), Take 25 mg by mouth daily., Disp: , Rfl: ;  levothyroxine (SYNTHROID, LEVOTHROID) 25 MCG tablet, Take 25 mcg by mouth daily before breakfast., Disp: , Rfl: ;  Linaclotide (LINZESS) 145 MCG CAPS capsule, Take 145 mcg by mouth once., Disp: , Rfl:  Melatonin 5 MG CAPS, Take by mouth., Disp: , Rfl: ;  ondansetron (ZOFRAN) 4 MG tablet, Take 1 tablet (4 mg total) by mouth every 8 (eight) hours as needed for nausea or vomiting., Disp: 20 tablet, Rfl: 0;  oxyCODONE-acetaminophen (PERCOCET/ROXICET) 5-325 MG per tablet, Take 1-2 tablets  by mouth every 4 (four) hours as needed for severe pain (moderate to severe pain (when tolerating fluids))., Disp: 30 tablet, Rfl: 0 Prenatal Vit-Fe Fumarate-FA (PRENATAL MULTIVITAMIN) TABS tablet, Take 1 tablet by mouth daily at 12 noon., Disp: , Rfl: ;  progesterone (PROMETRIUM) 200 MG capsule, Take 200 mg by mouth daily., Disp: , Rfl: ;  Sennosides (EX-LAX PO), Take 2 tablets by mouth once., Disp: , Rfl: ;  zolpidem (AMBIEN) 5 MG tablet, Take 5 mg by mouth at bedtime as needed for sleep., Disp: , Rfl:   EXAM:  Filed Vitals:   09/22/13 0808  BP: 120/70  Pulse: 96  Temp: 98.3 F (36.8 C)    Body mass index is 32.78 kg/(m^2).  GENERAL: vitals reviewed and listed above, alert, oriented, appears well hydrated and in no acute distress  HEENT: atraumatic, conjunttiva clear, no obvious abnormalities on inspection of external nose and ears  NECK: no obvious masses on inspection  MS: moves all extremities without noticeable abnormality  PSYCH: pleasant and cooperative, no obvious depression or anxiety  ASSESSMENT AND PLAN:  Discussed the following assessment and plan:  Depression - Plan: Ambulatory referral to Psychiatry  Fibromyalgia  Abdominal pain - Plan: Tissue Transglutaminase, IGA, IgA, Ambulatory referral to Gastroenterology  Nausea with vomiting - Plan: Tissue Transglutaminase, IGA, IgA, Ambulatory referral to Gastroenterology  Generalized anxiety disorder - Plan: Ambulatory referral to Psychiatry  -ttga igA, igA - discussed that these are screening test and advise follow up with GI and will have assistant fax these to Dr. Benson Norway -referrals placed to GI for follow up of her chronic GI complaints and to Psych for her fairly significant anxiety -follow up with me as needed -Patient advised to return or notify a doctor immediately if symptoms worsen or persist or new concerns arise.  Patient Instructions  -We have ordered labs or studies at this visit. It can take up to 1-2  weeks for results and processing. We will contact you with instructions IF your results are abnormal. Normal results will be released to your [facility]. If you have not heard from [place] or can not find your results in [facility] in 2 weeks please contact our office.  -We placed a referral for you as discussed to the gastroenterologist and to the psychiatrist. It usually takes about 1-2 weeks to process and schedule this referral. If you have not heard from [place] regarding this appointment in 2 weeks please contact our office.   Follow up in:  as needed      Casey Cox

## 2013-09-22 NOTE — Progress Notes (Signed)
Pre visit review using our clinic review tool, if applicable. No additional management support is needed unless otherwise documented below in the visit note. 

## 2013-09-23 ENCOUNTER — Other Ambulatory Visit: Payer: Self-pay | Admitting: Family Medicine

## 2013-09-23 ENCOUNTER — Telehealth: Payer: Self-pay | Admitting: Family Medicine

## 2013-09-23 DIAGNOSIS — R6884 Jaw pain: Secondary | ICD-10-CM

## 2013-09-23 DIAGNOSIS — M26609 Unspecified temporomandibular joint disorder, unspecified side: Secondary | ICD-10-CM

## 2013-09-23 LAB — TISSUE TRANSGLUTAMINASE, IGA: TISSUE TRANSGLUTAMINASE AB, IGA: 2.8 U/mL (ref <20)

## 2013-09-23 NOTE — Telephone Encounter (Signed)
Referral sent 

## 2013-09-23 NOTE — Telephone Encounter (Signed)
Pt needs a referral to dr Diona Browner for jaw surgery. Pt saw dr Nicki Reaper jensen yesterday. Pt has [facility]

## 2013-10-24 ENCOUNTER — Encounter (HOSPITAL_COMMUNITY): Admission: RE | Payer: Self-pay | Source: Ambulatory Visit

## 2013-10-24 ENCOUNTER — Ambulatory Visit (HOSPITAL_COMMUNITY): Admission: RE | Admit: 2013-10-24 | Payer: 59 | Source: Ambulatory Visit | Admitting: Oral Surgery

## 2013-10-24 SURGERY — ARTHROTOMY
Anesthesia: General | Laterality: Bilateral

## 2013-10-28 ENCOUNTER — Telehealth: Payer: Self-pay | Admitting: Family Medicine

## 2013-10-28 DIAGNOSIS — M25569 Pain in unspecified knee: Secondary | ICD-10-CM

## 2013-10-28 DIAGNOSIS — N809 Endometriosis, unspecified: Secondary | ICD-10-CM

## 2013-10-28 DIAGNOSIS — E349 Endocrine disorder, unspecified: Secondary | ICD-10-CM

## 2013-10-28 DIAGNOSIS — L811 Chloasma: Secondary | ICD-10-CM

## 2013-10-28 DIAGNOSIS — M25559 Pain in unspecified hip: Secondary | ICD-10-CM

## 2013-10-28 NOTE — Telephone Encounter (Signed)
Would advise she get referral or recs regarding her endometriosis from her gynecologist. Madaline Brilliant to refer to derm and ortho. We don't refer to chiropractic can just set that up on her own.

## 2013-10-28 NOTE — Telephone Encounter (Signed)
done completed authorization -[id number] [employer] referral authorization

## 2013-10-28 NOTE — Telephone Encounter (Signed)
I called the pt and left a detailed message with the information below.  The orders were placed for dermatologist and orthopedic referrals.

## 2013-10-28 NOTE — Telephone Encounter (Signed)
1. Pt would like referral to dermatologist for melasma.  Pt has intense freckling due to hormone and causing pt's skin to break out. Pt states md aware.  2. Pt would like referral to chiropractor for back pain and fibromyalgia   3. Pt would like referral to endocrinologist for hormone imbalance due to endometriosis. 4. Pt would ike referral to an orthopedic md for knee and hip pain.  W/ her fibromyalgia, pt has a lot of joint pain.  Pt used to get injection in knees when she lived in [place], [place].  W/ pt's insurance, she needs referral for all appt.  Pt will be having jaw surgery and wants to take care of all issues while she is out of work.

## 2013-10-28 NOTE — Telephone Encounter (Signed)
Pt states insurance company states they are not going to pay for her referral appt. But there are notes in the system that states it was done.  Insurance untied health. Pt is going to have jaw surgery (rthrotomy) so this needs to be approved in order to do the surgery. Pls call pt asap.

## 2013-10-29 NOTE — Telephone Encounter (Signed)
Patient called back and left a message stating her insurance requires the referral to come from her PCP-not her gynecologist.  She requested the referral be sent back to Dr Letta Median as last one was for a pituitary related problem and she really needs this for endometriosis and hormone imbalance. Message forwarded to Dr Maudie Mercury.

## 2013-10-29 NOTE — Telephone Encounter (Signed)
I just reviewed her chart and it looks like Dr. Cruzita Lederer advised she see a reproductive endocrinologist and advised - Dr. Marzetta Board  [facility]  [address] [place], [place], [address].  [phone]  Either [facility] or [facility].  Is she ok with [place] placing this referral? If so can place.

## 2013-10-30 ENCOUNTER — Encounter: Payer: Self-pay | Admitting: Endocrinology

## 2013-10-30 NOTE — Addendum Note (Signed)
Addended by: Agnes Lawrence on: 10/30/2013 10:54 AM   Modules accepted: Orders

## 2013-10-30 NOTE — Telephone Encounter (Signed)
Patient states she does not want to see Dr Rolin Barry as she is not trying to have a child-requests referral for endometriosis and hormone imbalance.

## 2013-10-30 NOTE — Telephone Encounter (Signed)
I called the pt and left a detailed message at her cell number the order was placed for the referral and someone from our office will call with an appt.

## 2013-10-30 NOTE — Telephone Encounter (Signed)
Please let patient know we will send the referral per her request and then the endocrinologist hopefully will let us know if this is something they see/treat. Please send referral.

## 2013-11-12 ENCOUNTER — Telehealth: Payer: Self-pay | Admitting: Family Medicine

## 2013-11-12 NOTE — Telephone Encounter (Signed)
Pt would like referral to see dr  Meade Maw at dr [facility] chiropractor phone #   [phone]  for fibromyalgia. Pt has [employer] ins. Pt has appt tomorrow

## 2013-11-12 NOTE — Telephone Encounter (Signed)
Dr Kim-per Casey Cox the pt does need a referral from you to see the chiropractor due to her insurance.

## 2013-11-13 NOTE — Telephone Encounter (Signed)
I called the pt and left a detailed message with the information below and for her to call me back to let me know what she would like to do.

## 2013-11-13 NOTE — Telephone Encounter (Signed)
I do not refer to chiropractic for fibromyalgia for spinal manipulation. I can provide written prescription for massage, soft tissue modalities for pain related to fibromyalgia.

## 2013-11-14 ENCOUNTER — Encounter: Payer: Self-pay | Admitting: Internal Medicine

## 2013-11-14 ENCOUNTER — Ambulatory Visit (INDEPENDENT_AMBULATORY_CARE_PROVIDER_SITE_OTHER): Payer: 59 | Admitting: Internal Medicine

## 2013-11-14 VITALS — BP 108/64 | HR 105 | Temp 97.7°F | Resp 12 | Ht 63.0 in | Wt 187.0 lb

## 2013-11-14 DIAGNOSIS — L709 Acne, unspecified: Secondary | ICD-10-CM | POA: Insufficient documentation

## 2013-11-14 DIAGNOSIS — E349 Endocrine disorder, unspecified: Secondary | ICD-10-CM

## 2013-11-14 DIAGNOSIS — R635 Abnormal weight gain: Secondary | ICD-10-CM

## 2013-11-14 DIAGNOSIS — L708 Other acne: Secondary | ICD-10-CM

## 2013-11-14 DIAGNOSIS — R7989 Other specified abnormal findings of blood chemistry: Secondary | ICD-10-CM | POA: Insufficient documentation

## 2013-11-14 LAB — LUTEINIZING HORMONE: LH: 9.85 m[IU]/mL

## 2013-11-14 LAB — T3, FREE: T3, Free: 3 pg/mL (ref 2.3–4.2)

## 2013-11-14 LAB — T4, FREE: FREE T4: 0.95 ng/dL (ref 0.60–1.60)

## 2013-11-14 LAB — TSH: TSH: 0.17 u[IU]/mL — AB (ref 0.35–4.50)

## 2013-11-14 LAB — FOLLICLE STIMULATING HORMONE: FSH: 5.1 m[IU]/mL

## 2013-11-14 LAB — HEMOGLOBIN A1C: Hgb A1c MFr Bld: 5.4 % (ref 4.6–6.5)

## 2013-11-14 NOTE — Progress Notes (Addendum)
Patient ID: Casey Cox, female   DOB: 07-Apr-1976, 38 y.o.   MRN: [id number]  HPI: Casey Cox is a 38 y.o. female, referred by Pt's PCP, Dr Maudie Mercury, in consultation for hormonal imbalance.  Pt would also want me to evaluate her hypothyroidism.ObGyn: Dr Lynnette Caffey.  I reviewed pt's chart and she has a h/o low Progesterone, but with ovulatory cycles. She is on Prometrium 200 mg daily which she took in the past and recently restarted. She has endometriosis and requested the endocrine referral for this. Dr Maudie Mercury advised her to see ObGyn for this problem, but pt insisted to see endocrinology for hormonal imbalance associated with endometriosis. She had laparoscopic sx for endometriosis and appendectomy recently.  She had painful ovulation >> started on OCP >> gained 10 lbs in 10 days, then restarted Progesterone back >> no more pain with ovulation, light periods, no dysmenorrhea.   Fertility/Menstrual cycles: - regular menses, lighter now on Progesterone - + h/o ovarian cysts on previous U/S's >> painful >> lasting <1 cycle - children: 0 - miscarriages: 2 - contraception: partner - LMP 11/06/2013  Acne: - started at 38 y/o - cystic - only on face - related to the menses - has appt with dermatologist - tried Doxycycline, ProActive, Spironolactone  Hirsutism: - no  Weight: - gain: from 145 to 187 - exercises a lot; gets at least 10,000 steps a day - she tried Phentermin and Qsymia >> did not work - saw dietitian - Meals:  - Breakfast: whole wheat toast and fruit - Lunch: salad or leftovers  - Dinner: protein source + veggies - Snacks: 1-3: fruit or veggies or crackers + humus   Other meds: - SSRIs: no - Flexeril - Xanax - Excedrin migraine - Percocet - Ambien  Other medical pbs: - fibromyalgia - TMJ - depression - migraines  - Pt has mild hypothyroidism and she was on Levothyroxine 25 mcg daily >> helped her daily fatigue greatly. Latest thyroid tests: 08/2013: TSH  was low reportedly >> taken off the Levothyroxine then. Lab Results  Component Value Date   TSH 0.646 09/04/2012   FREET4 0.86 02/20/2012   - Last HbA1c: Lab Results  Component Value Date   HGBA1C 5.1 02/20/2012   She has FH of endometriosis.  ROS: Constitutional: + weight gain, + fatigue, no subjective hyperthermia/hypothermia, + poor sleep Eyes: + blurry vision, no xerophthalmia ENT: no sore throat, no nodules palpated in throat, no dysphagia/odynophagia, no hoarseness, + tinnitus, + hypoacusis Cardiovascular: no CP/SOB/palpitations/leg swelling Respiratory: no cough/SOB Gastrointestinal: + N/no V/D/C Musculoskeletal: + all: muscle/joint aches Skin: + acne, no excess hair on face Neurological: no tremors/numbness/tingling/dizziness, + HA Psychiatric: no depression/+ anxiety  Past Medical History  Diagnosis Date  . Menorrhagia   . Patellofemoral syndrome   . Fibromyalgia   . Depression   . UTI (urinary tract infection)     HX  . Anemia     h/o anemia  . Anxiety   . 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 Hypothyroidism   . Migraine     HX - last migraine 06/19/13 - does not take meds - sleep/dark room  . Migraines   . Arthritis     knees,hips  . 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 Missed abortions     x 2 - no surgery required  . Infertility     HX   Past Surgical History  Procedure Laterality Date  . Mandible fracture surgery  06/30/12  ARTHOCENTESIS OF THE JAW(tmj)  . Lasix  12/2006    LASIX OU  . Eye surgery      Lasik bilateral  . Wisdom tooth extraction    . Laparoscopy N/A 06/24/2013    Procedure: LAPAROSCOPY OPERATIVE WITH CHROMOPERTUBATION, LYSIS OF ADHESIONS AND APPENDECTOMY ;  Surgeon: Linda Hedges, DO;  Location: [facility];  Service: Gynecology;  Laterality: N/A;   History   Social History  . Marital Status: Married    Spouse Name: N/A    Number of Children: 0   Occupational History  . 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 Smoking status: Never Smoker   . Smokeless tobacco: Never Used  . Alcohol Use: 0.6 oz/week    1 Glasses of wine per week     Comment: occ glass of wine  . Drug Use: No  . Sexual Activity: Yes    Partners: Female    Museum/gallery curator: None     Comment: partner    Social History Narrative   Work or School: full time [employer] Situation: lives with female partner   Current Outpatient Prescriptions on File Prior to Visit  Medication Sig Dispense Refill  . ALPRAZolam (XANAX) 0.5 MG tablet Take 0.5 mg by mouth at bedtime as needed for sleep.       Marland Kitchen aspirin-acetaminophen-caffeine (EXCEDRIN MIGRAINE) 250-250-65 MG per tablet Take 2 tablets by mouth once. Pt took and then vomited.      . cyclobenzaprine (FLEXERIL) 10 MG tablet Take 20 mg by mouth at bedtime as needed for muscle spasms (and sleep prn).      Marland Kitchen ibuprofen (ADVIL,MOTRIN) 200 MG tablet Take 600 mg by mouth every 8 (eight) hours as needed for cramping.      . Lactobacillus (ACIDOPHILUS PO) Take 25 mg by mouth daily.      Marland Kitchen levothyroxine (SYNTHROID, LEVOTHROID) 25 MCG tablet Take 25 mcg by mouth daily before breakfast.      . Linaclotide (LINZESS) 145 MCG CAPS capsule Take 145 mcg by mouth once.      . Melatonin 5 MG CAPS Take by mouth.      . ondansetron (ZOFRAN) 4 MG tablet Take 1 tablet (4 mg total) by mouth every 8 (eight) hours as needed for nausea or vomiting.  20 tablet  0  . oxyCODONE-acetaminophen (PERCOCET/ROXICET) 5-325 MG per tablet Take 1-2 tablets by mouth every 4 (four) hours as needed for severe pain (moderate to severe pain (when tolerating fluids)).  30 tablet  0  . Prenatal Vit-Fe Fumarate-FA (PRENATAL MULTIVITAMIN) TABS tablet Take 1 tablet by mouth daily at 12 noon.      . progesterone (PROMETRIUM) 200 MG capsule Take 200 mg by mouth daily.      . Sennosides (EX-LAX PO) Take 2 tablets by mouth once.      Marland Kitchen zolpidem (AMBIEN) 5 MG tablet Take 5 mg by mouth at bedtime  as needed for sleep.       No current facility-administered medications on file prior to visit.   Allergies  Allergen Reactions  . Amoxicillin Rash  . Penicillins Rash   Family History  Problem Relation Age of Onset  . Fibromyalgia Mother   . Arthritis Mother   . Cancer Mother     SKIN CA  . Diabetes Father   . Arthritis Father   . Mental illness Maternal Grandmother   . Cancer Maternal  Grandfather     PROSTATE CA  . Diabetes Maternal Grandfather   . Diabetes Paternal Grandmother   . Hyperlipidemia Paternal Grandmother   . Hypertension Paternal Grandmother   . Hypertension Paternal Grandfather   . Arthritis Paternal Grandfather   . Heart disease Paternal Grandfather    PE: BP 108/64  Pulse 105  Temp(Src) 97.7 F (36.5 C) (Oral)  Resp 12  Ht 5\' 3"  (1.6 m)  Wt 187 lb (84.823 kg)  BMI 33.13 kg/m2  SpO2 99% Wt Readings from Last 3 Encounters:  11/14/13 187 lb (84.823 kg)  09/22/13 185 lb (83.915 kg)  06/24/13 192 lb (87.091 kg)   Constitutional: overweight, in NAD, no full supraclavicular fat pads Eyes: PERRLA, EOMI, no exophthalmos ENT: moist mucous membranes, no thyromegaly, no cervical lymphadenopathy Cardiovascular: RRR, No MRG Respiratory: CTA B Gastrointestinal: abdomen soft, NT, ND, BS+ Musculoskeletal: no deformities, strength intact in all 4 Skin: moist, warm; + acne on face, no skin tags, mild acanthosis nigricans, no purple, wide, stretch marks Neurological: no tremor with outstretched hands, DTR normal in all 4  ASSESSMENT: 1. Hormonal imbalance - Low progesterone  2. Weight gain  3. Acne  PLAN: 1., 2. And 3. We discussed that in the setting of her weight gain, acne and abnormal progesterone levels, we need to check her for PCOS. This is going to be challenging since she is on Progesterone. If she has this, Metformin can help with insulin resistance and for weight loss. - I had a long discussion with the patient about the fact that the PCOS is a  misnomer, a patient does not necessarily have to have polycystic ovaries to be diagnosed with the disorder. This is of sum of several conditions, including:  weight gain  insulin resistance (and therefore a higher risk of developing diabetes later in life)  acne  hirsutism  irregular menstrual cycles (or unovulatory)  decreased fertility. - We also discussed about the fact that the treatment is usually targeted to addressing the problem that concerns the patient the most: acne/hirsutism, weight gain, or fertility, but there is no single treatment for PCOS.  - The first-line therapy are oral contraceptives - she is doing well only on Progesterone, did not feel well on OCPs. Since she is concerned with her weight, we can use metformin. Also, for acne, we can add back spironolactone, but she tried this in the past and did not work.  - will check the following tests: Orders Placed This Encounter  Procedures  . TSH  . T4, free  . T3, free  . HgB A1c  . Prolactin  . 17-Hydroxyprogesterone  . LH  . [place]  . Testosterone, free, total  . Estradiol  . Androstenedione  Will decide for or against Metformin ~ on the results.  Component     Latest Ref Rng 11/14/2013  Testosterone     10 - 70 ng/dL 29  Sex Hormone Binding     18 - 114 nmol/L 34  Testosterone Free     0.6 - 6.8 pg/mL 5.1  Testosterone-% Free     0.4 - 2.4 % 1.8  Estradiol, Free      0.81  Estradiol      37  Results received      11/21/13  TSH     0.35 - 4.50 uIU/mL 0.17 (L)  Hemoglobin A1C     4.6 - 6.5 % 5.4  Free T4     0.60 - 1.60 ng/dL 0.95  T3, Free  2.3 - 4.2 pg/mL 3.0  Prolactin      8.4  17-OH-Progesterone, LC/Casey/Casey      19  LH      9.85  FSH      5.1  Androstenedione      108   Unclear if PCOS as testosterone not high, but this can be from Progesterone tx... I will suggest to come off Prometrium for 2 mo and recheck testosterone level. TSH still low now off LT4 >> stay off for now and need  to repeat thyroid labs in 2 mo   Addendum:  Component     Latest Ref Rng 01/26/2014  Testosterone     10 - 70 ng/dL 41  Sex Hormone Binding     18 - 114 nmol/L 60  Testosterone Free     0.6 - 6.8 pg/mL 5.0  Testosterone-% Free     0.4 - 2.4 % 1.2  TSH     0.35 - 4.50 uIU/mL 0.11 (L)  Free T4     0.60 - 1.60 ng/dL 0.84  T3, Free     2.3 - 4.2 pg/mL 3.2   Msg sent: Dear Casey Cox,  The testosterone levels are the same as before (normal). In this case, I do not think we can diagnose PCOS reliably.  Regarding the TSH, this is still low. The rest of the thyroid tests are normal (this situation is called subclinical hyperthyroidism). The TSH can be low during treatment with narcotics and I saw that you just started this tx ~2 weeks ago. I am not sure if you are taking this consistently, but this may decrease the TSH if you are.  For subclinical thyroidism , the indication is to follow the tests to make sure they do not become more abnormal. Please come back for a lab appointment in 2 more months. Please call our main office number [phone]) to schedule the lab appointment.  Sincerely,  Philemon Kingdom MD

## 2013-11-14 NOTE — Patient Instructions (Signed)
Please stop at the lab.  We may start Metformin after the results are back. If we do, please start Metformin 500 mg with dinner x 4 days. If you tolerate this well, add another Metformin tablet (500 mg) with breakfast x 4 days. If you tolerate this well, add another metformin tablet with dinner (total 1000 mg) x 4 days. If you tolerate this well, add another metformin tablets with breakfast (total 1000 mg). Continue with 1000 mg of metformin twice a day with breakfast and dinner.  Please come back for a follow-up appointment in 6 months

## 2013-11-15 LAB — PROLACTIN: PROLACTIN: 8.4 ng/mL

## 2013-11-17 LAB — TESTOSTERONE, FREE, TOTAL, SHBG
SEX HORMONE BINDING: 34 nmol/L (ref 18–114)
TESTOSTERONE FREE: 5.1 pg/mL (ref 0.6–6.8)
TESTOSTERONE-% FREE: 1.8 % (ref 0.4–2.4)
TESTOSTERONE: 29 ng/dL (ref 10–70)

## 2013-11-18 LAB — 17-HYDROXYPROGESTERONE: 17-OH-Progesterone, LC/MS/MS: 19 ng/dL

## 2013-11-20 LAB — ANDROSTENEDIONE: Androstenedione: 108 ng/dL

## 2013-11-21 LAB — ESTRADIOL, FREE
ESTRADIOL FREE: 0.81 pg/mL
Estradiol: 37 pg/mL

## 2013-11-26 ENCOUNTER — Encounter: Payer: Self-pay | Admitting: Family Medicine

## 2013-11-26 ENCOUNTER — Ambulatory Visit (INDEPENDENT_AMBULATORY_CARE_PROVIDER_SITE_OTHER): Payer: 59 | Admitting: Family Medicine

## 2013-11-26 VITALS — BP 110/78 | HR 107 | Temp 97.9°F | Ht 63.0 in | Wt 189.5 lb

## 2013-11-26 DIAGNOSIS — IMO0001 Reserved for inherently not codable concepts without codable children: Secondary | ICD-10-CM

## 2013-11-26 DIAGNOSIS — L255 Unspecified contact dermatitis due to plants, except food: Secondary | ICD-10-CM

## 2013-11-26 DIAGNOSIS — G8929 Other chronic pain: Secondary | ICD-10-CM

## 2013-11-26 DIAGNOSIS — F411 Generalized anxiety disorder: Secondary | ICD-10-CM

## 2013-11-26 DIAGNOSIS — M797 Fibromyalgia: Secondary | ICD-10-CM

## 2013-11-26 MED ORDER — TRIAMCINOLONE ACETONIDE 0.1 % EX CREA: 1.0000 "application " | TOPICAL_CREAM | Freq: Two times a day (BID) | CUTANEOUS | Status: DC

## 2013-11-26 NOTE — Progress Notes (Signed)
No chief complaint on file.   HPI:  Acute visit for:  Fibromyalgia: -she has had prior workup with rheum and neuro, she also suffers from depression and anxiety -requested referral to ortho for this as reported lots of knee pain -reports: seeing Dr. Charlestine Night - but reports he doesn't believe in fibromyalgia and she is tired of him trying to xanax her to sleep - wants to see a different rheumatologist -denies: SI, thoughts of self harm -meditating, doing massage therapy, not doing any counseling (she hates seeing psych or psychologist) -saw Dr. French Ana and and is getting MRI of knee, seeing   Rash on face: -referred to dermatologist per her request -started about 1-2 weeks ago on neck and hand - thought may be poison ivy - now improving  Fatigue/Weight gain: -seeing Dr. Renne Crigler and off progesterone now and then will be rechecking labs and may consider metformin   ROS: See pertinent positives and negatives per HPI.  Past Medical History  Diagnosis Date  . Menorrhagia   . Patellofemoral syndrome   . Fibromyalgia   . Depression   . UTI (urinary tract infection)     HX  . Anemia     h/o anemia  . Anxiety   . 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 Hypothyroidism   . Migraine     HX - last migraine 06/19/13 - does not take meds - sleep/dark room  . Migraines   . Arthritis     knees,hips  . 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 Missed abortions     x 2 - no surgery required  . Infertility     HX    Past Surgical History  Procedure Laterality Date  . Mandible fracture surgery  06/30/12    ARTHOCENTESIS OF THE JAW(tmj)  . Lasix  12/2006    LASIX OU  . Eye surgery      Lasik bilateral  . Wisdom tooth extraction    . Laparoscopy N/A 06/24/2013    Procedure: LAPAROSCOPY OPERATIVE WITH CHROMOPERTUBATION, LYSIS OF ADHESIONS AND APPENDECTOMY ;  Surgeon: Linda Hedges, DO;  Location: [facility];  Service: Gynecology;  Laterality: N/A;    Family History  Problem Relation Age of  Onset  . Fibromyalgia Mother   . Arthritis Mother   . Cancer Mother     SKIN CA  . Diabetes Father   . Arthritis Father   . Mental illness Maternal Grandmother   . Cancer Maternal Grandfather     PROSTATE CA  . Diabetes Maternal Grandfather   . Diabetes Paternal Grandmother   . Hyperlipidemia Paternal Grandmother   . Hypertension Paternal Grandmother   . Hypertension Paternal Grandfather   . Arthritis Paternal Grandfather   . Heart disease Paternal Grandfather     History   Social History  . Marital Status: Married    Spouse Name: N/A    Number of Children: N/A  . Years of Education: N/A   Social History Main Topics  . Smoking status: Never Smoker   . Smokeless tobacco: Never Used  . Alcohol Use: 0.6 oz/week    1 Glasses of wine per week     Comment: occ glass of wine  . Drug Use: No  . Sexual Activity: Yes    Partners: Female    Patent examiner Protection: None     Comment: partner   Other Topics Concern  . None   Social History Narrative   Work or School: full time  nanny      Home Situation: lives with female partner - trying for a child      Spiritual Beliefs:      Lifestyle:                Current outpatient prescriptions:ALPRAZolam (XANAX) 0.5 MG tablet, Take 0.5 mg by mouth at bedtime as needed for sleep. , Disp: , Rfl: ;  Cholecalciferol (VITAMIN D3) 2000 UNITS TABS, Take 2 tablets by mouth daily., Disp: , Rfl: ;  cyanocobalamin 500 MCG tablet, Take 500 mcg by mouth daily., Disp: , Rfl: ;  cyclobenzaprine (FLEXERIL) 10 MG tablet, Take 20 mg by mouth at bedtime as needed for muscle spasms (and sleep prn)., Disp: , Rfl:  ibuprofen (ADVIL,MOTRIN) 200 MG tablet, Take 600 mg by mouth every 8 (eight) hours as needed for cramping., Disp: , Rfl: ;  Melatonin 5 MG CAPS, Take by mouth., Disp: , Rfl: ;  Multiple Vitamin (MULTIVITAMIN) capsule, Take 1 capsule by mouth daily., Disp: , Rfl: ;  Omega-3 Fatty Acids (FISH OIL) 1000 MG CAPS, Take 2 capsules by mouth  daily., Disp: , Rfl:  ondansetron (ZOFRAN) 4 MG tablet, Take 1 tablet (4 mg total) by mouth every 8 (eight) hours as needed for nausea or vomiting., Disp: 20 tablet, Rfl: 0;  zolpidem (AMBIEN) 5 MG tablet, Take 5 mg by mouth at bedtime as needed for sleep., Disp: , Rfl: ;  triamcinolone cream (KENALOG) 0.1 %, Apply 1 application topically 2 (two) times daily., Disp: 30 g, Rfl: 0  EXAM:  Filed Vitals:   11/26/13 0925  BP: 110/78  Pulse: 107  Temp: 97.9 F (36.6 C)    Body mass index is 33.58 kg/(m^2).  GENERAL: vitals reviewed and listed above, alert, oriented, appears well hydrated and in no acute distress  HEENT: atraumatic, conjunttiva clear, no obvious abnormalities on inspection of external nose and ears  NECK: no obvious masses on inspection  LUNGS: clear to auscultation bilaterally, no wheezes, rales or rhonchi, good air movement  CV: HRRR, no peripheral edema  MS: moves all extremities without noticeable abnormality  SKIN: no apparent rash  PSYCH: pleasant and cooperative, no obvious depression or anxiety  ASSESSMENT AND PLAN:  Discussed the following assessment and plan:  Toxicodendron dermatitis - Plan: triamcinolone cream (KENALOG) 0.1 %  Fibromyalgia - Plan: Ambulatory referral to Rheumatology  Generalized anxiety disorder - Plan: Ambulatory referral to Psychiatry  Chronic pain - Plan: Ambulatory referral to Rheumatology, Ambulatory referral to Psychiatry  -complicated patient with chronic pain and anxiety, frequent requests for workup, repeat evaluation by specialist and frustrated that current rheumatologist does not believe in chronic pain and is treating her with xanax and ambien which is not helping. Discuss complex issues of chronic pain and management. -agreed to see psych, requesting referral to another rheumatologist and this may be reasonable in a comprehensive approach to her likely fibromyalgia -steroid cream for likely mild poison ivy -  resolving -Patient advised to return or notify a doctor immediately if symptoms worsen or persist or new concerns arise.  Patient Instructions  -We placed a referral for you as discussed to the psychiatrist to help with anxiety and chronic pain and to a rheumatologist per your request. It usually takes about 1-2 weeks to process and schedule this referral. If you have not heard from [place] regarding this appointment in 2 weeks please contact our office. It often takes 1-2 months for an appointment.   -we advise a healthy diet with lots of fresh  and/or frozen fruits and vegetables and at least 150-300 minutes of exercise per week.  -follow up as needed     Donaldson Richter, Jarrett Soho R.

## 2013-11-26 NOTE — Progress Notes (Signed)
Pre visit review using our clinic review tool, if applicable. No additional management support is needed unless otherwise documented below in the visit note. 

## 2013-11-26 NOTE — Patient Instructions (Signed)
-  We placed a referral for you as discussed to the psychiatrist to help with anxiety and chronic pain and to a rheumatologist per your request. It usually takes about 1-2 weeks to process and schedule this referral. If you have not heard from [place] regarding this appointment in 2 weeks please contact our office. It often takes 1-2 months for an appointment.   -we advise a healthy diet with lots of fresh and/or frozen fruits and vegetables and at least 150-300 minutes of exercise per week.  -follow up as needed

## 2013-12-03 ENCOUNTER — Encounter: Payer: Self-pay | Admitting: *Deleted

## 2013-12-12 ENCOUNTER — Encounter: Payer: Self-pay | Admitting: *Deleted

## 2013-12-18 ENCOUNTER — Telehealth: Payer: Self-pay | Admitting: Family Medicine

## 2013-12-18 NOTE — Telephone Encounter (Signed)
Patient informed. 

## 2013-12-18 NOTE — H&P (Signed)
HISTORY AND PHYSICAL  Casey Cox is a 38 y.o. female patient with NI:DPOE TMJ pain  HPI: Patient with 15 year h/o TMJ clicking popping. Began having locking of jaw in July 2013. History of rugby injury to jaw. Underwent Bilateral TMJ arthrocentesis in office 07/04/2012 under IV sedation. MRI confirms severe right tmj OA and degenerative TMJ changes left.  No diagnosis found.  Past Medical History  Diagnosis Date  . Menorrhagia   . Patellofemoral syndrome   . Fibromyalgia   . Depression   . UTI (urinary tract infection)     HX  . Anemia     h/o anemia  . Anxiety   . 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 Hypothyroidism   . Migraine     HX - last migraine 06/19/13 - does not take meds - sleep/dark room  . Migraines   . Arthritis     knees,hips  . 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 Missed abortions     x 2 - no surgery required  . Infertility     HX    No current facility-administered medications for this encounter.   Current Outpatient Prescriptions  Medication Sig Dispense Refill  . ALPRAZolam (XANAX) 0.5 MG tablet Take 0.5 mg by mouth at bedtime as needed for sleep.       . Cholecalciferol (VITAMIN D3) 2000 UNITS TABS Take 2 tablets by mouth daily.      . cyanocobalamin 500 MCG tablet Take 500 mcg by mouth daily.      . cyclobenzaprine (FLEXERIL) 10 MG tablet Take 20 mg by mouth at bedtime as needed for muscle spasms (and sleep prn).      Marland Kitchen ibuprofen (ADVIL,MOTRIN) 200 MG tablet Take 600 mg by mouth every 8 (eight) hours as needed for cramping.      . Melatonin 5 MG CAPS Take by mouth.      . Multiple Vitamin (MULTIVITAMIN) capsule Take 1 capsule by mouth daily.      . Omega-3 Fatty Acids (FISH OIL) 1000 MG CAPS Take 2 capsules by mouth daily.      . ondansetron (ZOFRAN) 4 MG tablet Take 1 tablet (4 mg total) by mouth every 8 (eight) hours as needed for nausea or vomiting.  20 tablet  0  . triamcinolone cream (KENALOG) 0.1 % Apply 1 application topically 2  (two) times daily.  30 g  0  . zolpidem (AMBIEN) 5 MG tablet Take 5 mg by mouth at bedtime as needed for sleep.       Allergies  Allergen Reactions  . Amoxicillin Rash  . Penicillins Rash   Active Problems:   * No active hospital problems. *  Vitals: There were no vitals taken for this visit. Lab results:No results found for this or any previous visit (from the past 53 hour(s)). Radiology Results: No results found. General appearance: alert and cooperative Head: Normocephalic, without obvious abnormality, atraumatic Eyes: negative Ears: normal TM's and external ear canals both ears Throat: lips, mucosa, and tongue normal; teeth and gums normal and bilateral TMJ clicking, popping. Maximum opening 108mm. Occlusion stable. Pharynx clear. Neck: no adenopathy, supple, symmetrical, trachea midline and thyroid not enlarged, symmetric, no tenderness/mass/nodules Resp: clear to auscultation bilaterally Cardio: regular rate and rhythm, S1, S2 normal, no murmur, click, rub or gallop  Assessment: Bilateral TMJ DJD  Plan: bilateral TMJ arthrotomy, meniscectomy, possible temporalis fascia graft. GA. Day surgery or possible 23 h obs.   Gae Bon  12/18/2013  

## 2013-12-18 NOTE — Telephone Encounter (Signed)
Caller: Renelle/Patient; Phone: [phone]; Reason for Call: Pt states she saw Dr.  Cruzita Lederer (Endo) and was supposed to have a 2 month f/u with her for more lab work (and to be off her progesterone for 2 months prior to the labwork).  She stopped her progesterone in June in anticipation of the f/u but was unable to get an appt with Dr. Cruzita Lederer until Sept.  Also states due to her insurance, she needs a referral for every specialist visit, even a f/u.  So she would like to ask if she could possibly see Dr. Maudie Mercury to have the f/u labwork done instead (in August before 8/16---having ankle surgery that day).  Assured her will send message and someone will call her back to discuss her request.

## 2013-12-18 NOTE — Telephone Encounter (Signed)
No. I advise she see Dr. Jasmine December for any advised follow up and if needs referral for that visit can place. Thanks.

## 2013-12-22 ENCOUNTER — Ambulatory Visit (HOSPITAL_COMMUNITY)
Admission: RE | Admit: 2013-12-22 | Discharge: 2013-12-22 | Disposition: A | Discharge status: Home / Self Care | Payer: 59 | Source: Ambulatory Visit | Attending: Oral Surgery | Admitting: Oral Surgery

## 2013-12-22 ENCOUNTER — Encounter (HOSPITAL_COMMUNITY): Payer: Self-pay

## 2013-12-22 DIAGNOSIS — M26609 Unspecified temporomandibular joint disorder, unspecified side: Secondary | ICD-10-CM

## 2013-12-22 DIAGNOSIS — M1990 Unspecified osteoarthritis, unspecified site: Secondary | ICD-10-CM | POA: Insufficient documentation

## 2013-12-22 DIAGNOSIS — IMO0001 Reserved for inherently not codable concepts without codable children: Secondary | ICD-10-CM | POA: Diagnosis not present

## 2013-12-22 DIAGNOSIS — E039 Hypothyroidism, unspecified: Secondary | ICD-10-CM | POA: Diagnosis not present

## 2013-12-22 DIAGNOSIS — M2669 Other specified disorders of temporomandibular joint: Secondary | ICD-10-CM | POA: Diagnosis present

## 2013-12-22 DIAGNOSIS — F3289 Other specified depressive episodes: Secondary | ICD-10-CM | POA: Diagnosis not present

## 2013-12-22 DIAGNOSIS — F329 Major depressive disorder, single episode, unspecified: Secondary | ICD-10-CM | POA: Diagnosis not present

## 2013-12-22 DIAGNOSIS — F411 Generalized anxiety disorder: Secondary | ICD-10-CM | POA: Diagnosis not present

## 2013-12-22 LAB — CBC
HEMATOCRIT: 35.5 % — AB (ref 36.0–46.0)
Hemoglobin: 12.1 g/dL (ref 12.0–15.0)
MCH: 29.7 pg (ref 26.0–34.0)
MCHC: 34.1 g/dL (ref 30.0–36.0)
MCV: 87 fL (ref 78.0–100.0)
PLATELETS: 297 10*3/uL (ref 150–400)
RBC: 4.08 MIL/uL (ref 3.87–5.11)
RDW: 13.1 % (ref 11.5–15.5)
WBC: 7.8 10*3/uL (ref 4.0–10.5)

## 2013-12-22 LAB — BASIC METABOLIC PANEL
ANION GAP: 13 (ref 5–15)
BUN: 8 mg/dL (ref 6–23)
CHLORIDE: 102 meq/L (ref 96–112)
CO2: 23 meq/L (ref 19–32)
Calcium: 9 mg/dL (ref 8.4–10.5)
Creatinine, Ser: 0.72 mg/dL (ref 0.50–1.10)
GFR calc Af Amer: >90 mL/min (ref >90)
GFR calc non Af Amer: >90 mL/min (ref >90)
Glucose, Bld: 99 mg/dL (ref 70–99)
Potassium: 4.7 mEq/L (ref 3.7–5.3)
Sodium: 138 mEq/L (ref 137–147)

## 2013-12-22 LAB — HCG, SERUM, QUALITATIVE: PREG SERUM: NEGATIVE

## 2013-12-22 MED ORDER — VANCOMYCIN HCL IN DEXTROSE 1-5 GM/200ML-% IV SOLN
1000.0000 mg | INTRAVENOUS | Status: AC
  Administered 2013-12-23: 1000 mg via INTRAVENOUS
  Filled 2013-12-22: qty 200

## 2013-12-22 NOTE — Pre-Procedure Instructions (Signed)
MOMOKO SLEZAK  12/22/2013   Your procedure is scheduled on:  12/23/13  Report to [facility] Admitting at 530 AM.  Call this number if you have problems the morning of surgery: [phone]   Remember:   Do not eat food or drink liquids after midnight.   Take these medicines the morning of surgery with A SIP OF WATER: xanax,flexeril   Do not wear jewelry, make-up or nail polish.  Do not wear lotions, powders, or perfumes. You may wear deodorant.  Do not shave 48 hours prior to surgery. Men may shave face and neck.  Do not bring valuables to the hospital.  [facility] is not responsible                  for any belongings or valuables.               Contacts, dentures or bridgework may not be worn into surgery.  Leave suitcase in the car. After surgery it may be brought to your room.  For patients admitted to the hospital, discharge time is determined by your                treatment team.               Patients discharged the day of surgery will not be allowed to drive  home.  Name and phone number of your driver: family  Special Instructions: Shower using CHG 2 nights before surgery and the night before surgery.  If you shower the day of surgery use CHG.  Use special wash - you have one bottle of CHG for all showers.  You should use approximately 1/3 of the bottle for each shower.   Please read over the following fact sheets that you were given: Pain Booklet, Coughing and Deep Breathing and Surgical Site Infection Prevention

## 2013-12-23 ENCOUNTER — Ambulatory Visit (HOSPITAL_COMMUNITY): Payer: 59 | Admitting: Anesthesiology

## 2013-12-23 ENCOUNTER — Encounter (HOSPITAL_COMMUNITY): Payer: Self-pay | Admitting: Anesthesiology

## 2013-12-23 ENCOUNTER — Encounter (HOSPITAL_COMMUNITY): Payer: 59 | Admitting: Anesthesiology

## 2013-12-23 ENCOUNTER — Encounter (HOSPITAL_COMMUNITY)
Admission: RE | Disposition: A | Discharge status: Home / Self Care | Payer: Self-pay | Source: Ambulatory Visit | Attending: Oral Surgery

## 2013-12-23 ENCOUNTER — Observation Stay (HOSPITAL_COMMUNITY)
Admission: RE | Admit: 2013-12-23 | Discharge: 2013-12-24 | Disposition: A | Discharge status: Home / Self Care | Payer: 59 | Source: Ambulatory Visit | Attending: Oral Surgery | Admitting: Oral Surgery

## 2013-12-23 DIAGNOSIS — E039 Hypothyroidism, unspecified: Secondary | ICD-10-CM | POA: Insufficient documentation

## 2013-12-23 DIAGNOSIS — R635 Abnormal weight gain: Secondary | ICD-10-CM

## 2013-12-23 DIAGNOSIS — F3289 Other specified depressive episodes: Secondary | ICD-10-CM | POA: Insufficient documentation

## 2013-12-23 DIAGNOSIS — M26639 Articular disc disorder of temporomandibular joint, unspecified side: Secondary | ICD-10-CM

## 2013-12-23 DIAGNOSIS — F329 Major depressive disorder, single episode, unspecified: Secondary | ICD-10-CM | POA: Insufficient documentation

## 2013-12-23 DIAGNOSIS — M2669 Other specified disorders of temporomandibular joint: Secondary | ICD-10-CM | POA: Diagnosis not present

## 2013-12-23 DIAGNOSIS — IMO0001 Reserved for inherently not codable concepts without codable children: Secondary | ICD-10-CM | POA: Insufficient documentation

## 2013-12-23 DIAGNOSIS — F411 Generalized anxiety disorder: Secondary | ICD-10-CM | POA: Insufficient documentation

## 2013-12-23 DIAGNOSIS — Z9049 Acquired absence of other specified parts of digestive tract: Secondary | ICD-10-CM

## 2013-12-23 DIAGNOSIS — R7989 Other specified abnormal findings of blood chemistry: Secondary | ICD-10-CM

## 2013-12-23 DIAGNOSIS — M26609 Unspecified temporomandibular joint disorder, unspecified side: Secondary | ICD-10-CM

## 2013-12-23 DIAGNOSIS — G8918 Other acute postprocedural pain: Secondary | ICD-10-CM

## 2013-12-23 DIAGNOSIS — M26629 Arthralgia of temporomandibular joint, unspecified side: Secondary | ICD-10-CM

## 2013-12-23 DIAGNOSIS — F32A Depression, unspecified: Secondary | ICD-10-CM

## 2013-12-23 DIAGNOSIS — M797 Fibromyalgia: Secondary | ICD-10-CM

## 2013-12-23 HISTORY — PX: ARTHROTOMY: SHX5728

## 2013-12-23 SURGERY — ARTHROTOMY
Anesthesia: General | Laterality: Bilateral

## 2013-12-23 MED ORDER — ACETAMINOPHEN 650 MG RE SUPP: 650.0000 mg | Freq: Four times a day (QID) | RECTAL | Status: DC | PRN

## 2013-12-23 MED ORDER — GLYCOPYRROLATE 0.2 MG/ML IJ SOLN
INTRAMUSCULAR | Status: AC
  Filled 2013-12-23: qty 2

## 2013-12-23 MED ORDER — MIDAZOLAM HCL 2 MG/2ML IJ SOLN
INTRAMUSCULAR | Status: AC
  Filled 2013-12-23: qty 2

## 2013-12-23 MED ORDER — ARTIFICIAL TEARS OP OINT
TOPICAL_OINTMENT | OPHTHALMIC | Status: DC | PRN
  Administered 2013-12-23: 1 via OPHTHALMIC

## 2013-12-23 MED ORDER — HYDROMORPHONE HCL PF 1 MG/ML IJ SOLN
INTRAMUSCULAR | Status: AC
  Filled 2013-12-23: qty 1

## 2013-12-23 MED ORDER — GLYCOPYRROLATE 0.2 MG/ML IJ SOLN
INTRAMUSCULAR | Status: DC | PRN
  Administered 2013-12-23: 0.4 mg via INTRAVENOUS

## 2013-12-23 MED ORDER — DIPHENHYDRAMINE HCL 12.5 MG/5ML PO ELIX: 12.5000 mg | ORAL_SOLUTION | Freq: Four times a day (QID) | ORAL | Status: DC | PRN

## 2013-12-23 MED ORDER — ONDANSETRON HCL 4 MG/2ML IJ SOLN
INTRAMUSCULAR | Status: AC
  Filled 2013-12-23: qty 2

## 2013-12-23 MED ORDER — ROCURONIUM BROMIDE 100 MG/10ML IV SOLN
INTRAVENOUS | Status: DC | PRN
  Administered 2013-12-23: 40 mg via INTRAVENOUS

## 2013-12-23 MED ORDER — NEOSTIGMINE METHYLSULFATE 10 MG/10ML IV SOLN
INTRAVENOUS | Status: AC
  Filled 2013-12-23: qty 1

## 2013-12-23 MED ORDER — FENTANYL CITRATE 0.05 MG/ML IJ SOLN
INTRAMUSCULAR | Status: DC | PRN
  Administered 2013-12-23 (×2): 50 ug via INTRAVENOUS
  Administered 2013-12-23: 100 ug via INTRAVENOUS
  Administered 2013-12-23 (×4): 50 ug via INTRAVENOUS

## 2013-12-23 MED ORDER — ONDANSETRON HCL 4 MG/2ML IJ SOLN
4.0000 mg | Freq: Once | INTRAMUSCULAR | Status: AC | PRN
  Administered 2013-12-23: 4 mg via INTRAVENOUS

## 2013-12-23 MED ORDER — OXYMETAZOLINE HCL 0.05 % NA SOLN
NASAL | Status: DC | PRN
  Administered 2013-12-23: 4 via NASAL

## 2013-12-23 MED ORDER — BACITRACIN ZINC 500 UNIT/GM EX OINT
TOPICAL_OINTMENT | CUTANEOUS | Status: AC
  Filled 2013-12-23: qty 15

## 2013-12-23 MED ORDER — ONDANSETRON HCL 4 MG/2ML IJ SOLN
INTRAMUSCULAR | Status: DC | PRN
  Administered 2013-12-23: 4 mg via INTRAVENOUS

## 2013-12-23 MED ORDER — HYDROMORPHONE HCL PF 1 MG/ML IJ SOLN
1.0000 mg | INTRAMUSCULAR | Status: DC | PRN
  Administered 2013-12-23 – 2013-12-24 (×5): 1 mg via INTRAVENOUS
  Filled 2013-12-23 (×5): qty 1

## 2013-12-23 MED ORDER — ONDANSETRON HCL 4 MG PO TABS: 4.0000 mg | ORAL_TABLET | Freq: Three times a day (TID) | ORAL | Status: DC | PRN

## 2013-12-23 MED ORDER — FENTANYL CITRATE 0.05 MG/ML IJ SOLN
INTRAMUSCULAR | Status: AC
  Filled 2013-12-23: qty 5

## 2013-12-23 MED ORDER — LIDOCAINE HCL (CARDIAC) 20 MG/ML IV SOLN
INTRAVENOUS | Status: DC | PRN
  Administered 2013-12-23: 60 mg via INTRAVENOUS

## 2013-12-23 MED ORDER — 0.9 % SODIUM CHLORIDE (POUR BTL) OPTIME
TOPICAL | Status: DC | PRN
  Administered 2013-12-23: 1000 mL

## 2013-12-23 MED ORDER — OXYCODONE HCL 5 MG PO TABS
5.0000 mg | ORAL_TABLET | ORAL | Status: DC | PRN
  Administered 2013-12-23 – 2013-12-24 (×3): 5 mg via ORAL
  Filled 2013-12-23 (×4): qty 1

## 2013-12-23 MED ORDER — DIPHENHYDRAMINE HCL 50 MG/ML IJ SOLN: 12.5000 mg | Freq: Four times a day (QID) | INTRAMUSCULAR | Status: DC | PRN

## 2013-12-23 MED ORDER — ROCURONIUM BROMIDE 50 MG/5ML IV SOLN
INTRAVENOUS | Status: AC
  Filled 2013-12-23: qty 1

## 2013-12-23 MED ORDER — PROPOFOL 10 MG/ML IV BOLUS
INTRAVENOUS | Status: AC
  Filled 2013-12-23: qty 20

## 2013-12-23 MED ORDER — ONDANSETRON HCL 4 MG/2ML IJ SOLN
4.0000 mg | Freq: Four times a day (QID) | INTRAMUSCULAR | Status: DC | PRN
  Administered 2013-12-23: 4 mg via INTRAVENOUS

## 2013-12-23 MED ORDER — HYDROMORPHONE HCL PF 1 MG/ML IJ SOLN
0.2500 mg | INTRAMUSCULAR | Status: DC | PRN
  Administered 2013-12-23 (×4): 0.25 mg via INTRAVENOUS
  Administered 2013-12-23 (×2): 0.5 mg via INTRAVENOUS

## 2013-12-23 MED ORDER — BACITRACIN 500 UNIT/GM EX OINT
TOPICAL_OINTMENT | CUTANEOUS | Status: DC | PRN
  Administered 2013-12-23: 1 via TOPICAL

## 2013-12-23 MED ORDER — BUPIVACAINE-EPINEPHRINE (PF) 0.25% -1:200000 IJ SOLN
INTRAMUSCULAR | Status: AC
  Filled 2013-12-23: qty 30

## 2013-12-23 MED ORDER — CYCLOBENZAPRINE HCL 10 MG PO TABS: 20.0000 mg | ORAL_TABLET | Freq: Every evening | ORAL | Status: DC | PRN

## 2013-12-23 MED ORDER — OXYCODONE HCL 5 MG/5ML PO SOLN: 5.0000 mg | Freq: Once | ORAL | Status: DC | PRN

## 2013-12-23 MED ORDER — DEXTROSE-NACL 5-0.45 % IV SOLN
INTRAVENOUS | Status: DC
  Administered 2013-12-23 – 2013-12-24 (×2): via INTRAVENOUS

## 2013-12-23 MED ORDER — ALPRAZOLAM 0.5 MG PO TABS: 0.5000 mg | ORAL_TABLET | Freq: Every evening | ORAL | Status: DC | PRN

## 2013-12-23 MED ORDER — PROPOFOL 10 MG/ML IV BOLUS
INTRAVENOUS | Status: DC | PRN
  Administered 2013-12-23: 180 mg via INTRAVENOUS

## 2013-12-23 MED ORDER — OXYCODONE-ACETAMINOPHEN 10-325 MG PO TABS: 1.0000 | ORAL_TABLET | ORAL | Status: DC | PRN

## 2013-12-23 MED ORDER — DEXAMETHASONE SODIUM PHOSPHATE 4 MG/ML IJ SOLN
INTRAMUSCULAR | Status: DC | PRN
  Administered 2013-12-23: 8 mg via INTRAVENOUS

## 2013-12-23 MED ORDER — MIDAZOLAM HCL 5 MG/5ML IJ SOLN
INTRAMUSCULAR | Status: DC | PRN
  Administered 2013-12-23: 2 mg via INTRAVENOUS

## 2013-12-23 MED ORDER — ACETAMINOPHEN 325 MG PO TABS
650.0000 mg | ORAL_TABLET | Freq: Four times a day (QID) | ORAL | Status: DC | PRN
  Administered 2013-12-23 – 2013-12-24 (×2): 650 mg via ORAL
  Filled 2013-12-23 (×2): qty 2

## 2013-12-23 MED ORDER — OXYCODONE HCL 5 MG PO TABS: 5.0000 mg | ORAL_TABLET | Freq: Once | ORAL | Status: DC | PRN

## 2013-12-23 MED ORDER — LACTATED RINGERS IV SOLN
INTRAVENOUS | Status: DC | PRN
  Administered 2013-12-23 (×2): via INTRAVENOUS

## 2013-12-23 MED ORDER — ONDANSETRON HCL 4 MG/2ML IJ SOLN: 8.0000 mg | Freq: Four times a day (QID) | INTRAMUSCULAR | Status: DC | PRN

## 2013-12-23 MED ORDER — BUPIVACAINE-EPINEPHRINE 0.25% -1:200000 IJ SOLN
INTRAMUSCULAR | Status: DC | PRN
  Administered 2013-12-23: 10 mL

## 2013-12-23 MED ORDER — LIDOCAINE HCL (CARDIAC) 20 MG/ML IV SOLN
INTRAVENOUS | Status: AC
  Filled 2013-12-23: qty 5

## 2013-12-23 MED ORDER — NEOSTIGMINE METHYLSULFATE 10 MG/10ML IV SOLN
INTRAVENOUS | Status: DC | PRN
  Administered 2013-12-23: 3 mg via INTRAVENOUS

## 2013-12-23 MED ORDER — LIDOCAINE-EPINEPHRINE 2 %-1:100000 IJ SOLN
INTRAMUSCULAR | Status: DC | PRN
  Administered 2013-12-23: 20 mL via INTRADERMAL

## 2013-12-23 MED ORDER — LIDOCAINE-EPINEPHRINE 2 %-1:100000 IJ SOLN
INTRAMUSCULAR | Status: AC
  Filled 2013-12-23: qty 1

## 2013-12-23 MED ORDER — OXYMETAZOLINE HCL 0.05 % NA SOLN
NASAL | Status: DC | PRN
  Administered 2013-12-23 (×2): 2 via NASAL

## 2013-12-23 MED ORDER — OXYMETAZOLINE HCL 0.05 % NA SOLN
NASAL | Status: AC
  Filled 2013-12-23: qty 15

## 2013-12-23 MED ORDER — PROMETHAZINE HCL 25 MG/ML IJ SOLN
25.0000 mg | Freq: Four times a day (QID) | INTRAMUSCULAR | Status: DC | PRN
  Administered 2013-12-23 – 2013-12-24 (×2): 25 mg via INTRAVENOUS
  Filled 2013-12-23 (×2): qty 1

## 2013-12-23 SURGICAL SUPPLY — 64 items
ATTRACTOMAT 16X20 MAGNETIC DRP (DRAPES) ×2 IMPLANT
BANDAGE ELASTIC 4 VELCRO ST LF (GAUZE/BANDAGES/DRESSINGS) ×2 IMPLANT
BLADE 10 SAFETY STRL DISP (BLADE) ×2 IMPLANT
BLADE MINI 60D BLUE (BLADE) ×2 IMPLANT
BLADE RECIP (BLADE) IMPLANT
BLADE RECIPRO TAPERED (BLADE) IMPLANT
BLADE SCLERAL 3.0 60 BEV DOWN (BLADE) ×2 IMPLANT
BLADE SURG 15 STRL LF DISP TIS (BLADE) ×2 IMPLANT
BLADE SURG 15 STRL SS (BLADE) ×2
BLADE SURG CLIPPER 3M 9600 (MISCELLANEOUS) ×2 IMPLANT
BNDG GAUZE ELAST 4 BULKY (GAUZE/BANDAGES/DRESSINGS) ×4 IMPLANT
BUR CROSS CUT (BURR)
BUR CROSS CUT FISSURE 1.6 (BURR) ×2 IMPLANT
BUR EGG ELITE 4.0 (BURR) IMPLANT
BUR SABER DIAMOND 2.5 (BURR) IMPLANT
BUR SRG MED 1.6XXCUT FSSR (BURR) IMPLANT
BUR STRYKER TAPERED RND 2.0M (BURR) ×2 IMPLANT
BUR SURG 4X8 MED (BURR) IMPLANT
BURR SRG MED 1.6XXCUT FSSR (BURR)
BURR SURG 4X8 MED (BURR)
CANISTER SUCTION 2500CC (MISCELLANEOUS) ×2 IMPLANT
CLEANER TIP ELECTROSURG 2X2 (MISCELLANEOUS) ×2 IMPLANT
COVER SURGICAL LIGHT HANDLE (MISCELLANEOUS) ×2 IMPLANT
CRADLE DONUT ADULT HEAD (MISCELLANEOUS) ×2 IMPLANT
DRAPE SURG 17X23 STRL (DRAPES) ×12 IMPLANT
DRSG TELFA 3X8 NADH (GAUZE/BANDAGES/DRESSINGS) ×2 IMPLANT
ELECT COATED BLADE 2.86 ST (ELECTRODE) ×2 IMPLANT
ELECT PAD GROUND ADT 9 (MISCELLANEOUS) ×2 IMPLANT
GAUZE SPONGE 4X4 16PLY XRAY LF (GAUZE/BANDAGES/DRESSINGS) ×6 IMPLANT
GLOVE BIO SURGEON STRL SZ 6.5 (GLOVE) ×2 IMPLANT
GLOVE BIO SURGEON STRL SZ7.5 (GLOVE) ×2 IMPLANT
GLOVE BIOGEL PI IND STRL 7.0 (GLOVE) ×1 IMPLANT
GLOVE BIOGEL PI INDICATOR 7.0 (GLOVE) ×1
GOWN STRL REUS W/ TWL LRG LVL3 (GOWN DISPOSABLE) ×1 IMPLANT
GOWN STRL REUS W/ TWL XL LVL3 (GOWN DISPOSABLE) ×1 IMPLANT
GOWN STRL REUS W/TWL LRG LVL3 (GOWN DISPOSABLE) ×1
GOWN STRL REUS W/TWL XL LVL3 (GOWN DISPOSABLE) ×1
KIT BASIN OR (CUSTOM PROCEDURE TRAY) ×2 IMPLANT
KIT ROOM TURNOVER OR (KITS) ×2 IMPLANT
LOCATOR NERVE 3 VOLT (DISPOSABLE) IMPLANT
NEEDLE 22X1 1/2 (OR ONLY) (NEEDLE) ×6 IMPLANT
NS IRRIG 1000ML POUR BTL (IV SOLUTION) ×2 IMPLANT
PAD ARMBOARD 7.5X6 YLW CONV (MISCELLANEOUS) ×4 IMPLANT
PENCIL BUTTON HOLSTER BLD 10FT (ELECTRODE) ×2 IMPLANT
RASP HELIOCORDIAL MED (MISCELLANEOUS) IMPLANT
SPONGE GAUZE 4X4 12PLY STER LF (GAUZE/BANDAGES/DRESSINGS) ×2 IMPLANT
SUT CHROMIC 3 0 PS 2 (SUTURE) ×2 IMPLANT
SUT CHROMIC 4 0 P 3 18 (SUTURE) IMPLANT
SUT ETHILON 5 0 P 3 18 (SUTURE) ×2
SUT MERSILENE 4 0 P 3 (SUTURE) ×4 IMPLANT
SUT NYLON ETHILON 5-0 P-3 1X18 (SUTURE) ×2 IMPLANT
SUT PROLENE 5 0 P 3 (SUTURE) ×2 IMPLANT
SUT PROLENE 5 0 PS 2 (SUTURE) ×4 IMPLANT
SUT SILK 2 0 (SUTURE) ×1
SUT SILK 2-0 18XBRD TIE 12 (SUTURE) ×1 IMPLANT
SUT VIC AB 4-0 PC1 18 (SUTURE) ×2 IMPLANT
SUT VIC AB 4-0 PS2 27 (SUTURE) IMPLANT
SYR CONTROL 10ML LL (SYRINGE) ×4 IMPLANT
TOWEL OR 17X24 6PK STRL BLUE (TOWEL DISPOSABLE) ×2 IMPLANT
TOWEL OR 17X26 10 PK STRL BLUE (TOWEL DISPOSABLE) ×2 IMPLANT
TRAY ENT MC OR (CUSTOM PROCEDURE TRAY) ×2 IMPLANT
TUBE CONNECTING 12X1/4 (SUCTIONS) ×2 IMPLANT
WATER STERILE IRR 1000ML POUR (IV SOLUTION) ×2 IMPLANT
YANKAUER SUCT BULB TIP NO VENT (SUCTIONS) ×2 IMPLANT

## 2013-12-23 NOTE — Progress Notes (Signed)
Pt states that she is scared to go home that her pain and nausea she might not be able to control. Notified Dr.Jensin. He states that he will put in admission orders as soon as he gets to his office. Will cont to monitor,treat, and await new orders

## 2013-12-23 NOTE — Anesthesia Preprocedure Evaluation (Signed)
Anesthesia Evaluation  Patient identified by MRN, date of birth, ID band Patient awake    Reviewed: Allergy & Precautions, H&P , NPO status , Patient's Chart, lab work & pertinent test results  Airway Mallampati: I TM Distance: >3 FB Neck ROM: Full    Dental  (+) Teeth Intact, Dental Advisory Given   Pulmonary  breath sounds clear to auscultation        Cardiovascular Rhythm:Regular Rate:Normal     Neuro/Psych    GI/Hepatic   Endo/Other    Renal/GU      Musculoskeletal  (+) Fibromyalgia -  Abdominal   Peds  Hematology   Anesthesia Other Findings   Reproductive/Obstetrics                           Anesthesia Physical Anesthesia Plan  ASA: II  Anesthesia Plan: General   Post-op Pain Management:    Induction: Intravenous  Airway Management Planned: Nasal ETT and Oral ETT  Additional Equipment:   Intra-op Plan:   Post-operative Plan: Extubation in OR  Informed Consent: I have reviewed the patients History and Physical, chart, labs and discussed the procedure including the risks, benefits and alternatives for the proposed anesthesia with the patient or authorized representative who has indicated his/her understanding and acceptance.   Dental advisory given  Plan Discussed with: CRNA, Anesthesiologist and Surgeon  Anesthesia Plan Comments:         Anesthesia Quick Evaluation

## 2013-12-23 NOTE — Transfer of Care (Signed)
Immediate Anesthesia Transfer of Care Note  Patient: Casey Cox  Procedure(s) Performed: Procedure(s): BILATERAL ARTHROTOMY, MENISECTOMY, TEMPORALIS FASCIA GRAFT (Bilateral)  Patient Location: PACU  Anesthesia Type:General  Level of Consciousness: awake, alert , oriented and patient cooperative  Airway & Oxygen Therapy: Patient Spontanous Breathing and Patient connected to nasal cannula oxygen  Post-op Assessment: Report given to PACU RN and Post -op Vital signs reviewed and stable  Post vital signs: Reviewed and stable  Complications: No apparent anesthesia complications

## 2013-12-23 NOTE — H&P (Signed)
H&P documentation  -History and Physical Reviewed  -Patient has been re-examined  -No change in the plan of care  Sheri Gatchel M  

## 2013-12-23 NOTE — Anesthesia Postprocedure Evaluation (Signed)
  Anesthesia Post-op Note  Patient: Casey Cox  Procedure(s) Performed: Procedure(s): BILATERAL ARTHROTOMY, MENISECTOMY, TEMPORALIS FASCIA GRAFT (Bilateral)  Patient Location: PACU  Anesthesia Type:General  Level of Consciousness: awake, alert  and oriented  Airway and Oxygen Therapy: Patient Spontanous Breathing  Post-op Pain: moderate  Post-op Assessment: Post-op Vital signs reviewed  Post-op Vital Signs: Reviewed  Last Vitals:  Filed Vitals:   12/23/13 1100  BP: 120/74  Pulse: 100  Temp:   Resp: 10    Complications: No apparent anesthesia complications

## 2013-12-23 NOTE — Discharge Instructions (Signed)
General Anesthesia, Adult, Care After  Refer to this sheet in the next few weeks. These instructions provide you with information on caring for yourself after your procedure. Your health care provider may also give you more specific instructions. Your treatment has been planned according to current medical practices, but problems sometimes occur. Call your health care provider if you have any problems or questions after your procedure.  WHAT TO EXPECT AFTER THE PROCEDURE  After the procedure, it is typical to experience:  Sleepiness.  Nausea and vomiting. HOME CARE INSTRUCTIONS  For the first 24 hours after general anesthesia:  Have a responsible person with you.  Do not drive a car. If you are alone, do not take public transportation.  Do not drink alcohol.  Do not take medicine that has not been prescribed by your health care provider.  Do not sign important papers or make important decisions.  You may resume a normal diet and activities as directed by your health care provider.  Change bandages (dressings) as directed.  If you have questions or problems that seem related to general anesthesia, call the hospital and ask for the anesthetist or anesthesiologist on call. SEEK MEDICAL CARE IF:  You have nausea and vomiting that continue the day after anesthesia.  You develop a rash. SEEK IMMEDIATE MEDICAL CARE IF:  You have difficulty breathing.  You have chest pain.  You have any allergic problems. Document Released: 08/21/2000 Document Revised: 01/15/2013 Document Reviewed: 11/28/2012  [facility] Patient Information 2014 [place], [place].   What to Eat after Tooth extraction:     For your first meals, you should eat lightly; only small meals at first.   Avoid Sharp, Crunchy, and Hot foods.   If you do not have nausea, you may eat larger meals.  Avoid spicy, greasy and heavy food, as these may make you sick after the anesthesia.

## 2013-12-23 NOTE — Op Note (Signed)
12/23/2013  10:11 AM  PATIENT:  Casey Cox  38 y.o. female  PRE-OPERATIVE DIAGNOSIS: BILATERAL TEMPOROMANDIBULAR JOINT INTERNAL DERANGEMENT, DJD  POST-OPERATIVE DIAGNOSIS:  SAME  PROCEDURE:  Procedure(s): BILATERAL tmj ARTHROTOMY, MENISECTOMY, TEMPORALIS FASCIA GRAFT  SURGEON:  Surgeon(s): Gae Bon, DDS  ANESTHESIA:   local and general  EBL:  minimal  DRAINS: none   SPECIMEN:  No Specimen  COUNTS:  YES  PLAN OF CARE: Discharge to home after PACU  PATIENT DISPOSITION:  PACU - hemodynamically stable.   PROCEDURE DETAILS: Dictation # [id number]  Gae Bon, DMD 12/23/2013 10:11 AM

## 2013-12-23 NOTE — Op Note (Signed)
NAMECINCERE, DEPREY NO.:  [id number]  MEDICAL RECORD NO.:  [id number]  LOCATION:  [facility]                         FACILITY:  [facility]  PHYSICIAN:  Gae Bon, M.D.  DATE OF BIRTH:  1975-12-06  DATE OF PROCEDURE:  12/23/2013 DATE OF DISCHARGE:                              OPERATIVE REPORT   PREOPERATIVE DIAGNOSES:  Bilateral temporomandibular joint internal derangement, degenerative joint disease.  POSTOPERATIVE DIAGNOSES:  Bilateral temporomandibular joint internal derangement, degenerative joint disease.  PROCEDURES:  Bilateral temporomandibular joint arthrotomy, meniscectomy, temporalis fascia graft.  SURGEON:  Gae Bon, M.D.  ANESTHESIA:  General.  DESCRIPTION OF PROCEDURE:  The patient was taken to the operating room and placed on the table in supine position.  General anesthesia was administered intravenously and nasal endotracheal tube was placed.  The eyes were protected.  The table was turned.  The patient's temporal region and preauricular region were shaved and then, the patient was prepped and draped for surgery.  Preoperative mouth opening was measured at 40 mm.  The right side was operated first.  Sterile marking pen was used to make preauricular incision with superior extension with anterior curvature into the temporal scalp.  Then, local anesthesia, 2% lidocaine with 1:100,000 epinephrine was infiltrated along the line of incision and into the right temporomandibular joint capsule, total of 10 mL was utilized.  Then, a 15-blade was used to make the incision, carried down through the skin and subcutaneous tissue.  Bleeding vessels were cauterized with Bovie electrocautery.  The superior dissection was done first using blunt hemostat until the superficial layer of deep temporal fascia was encountered and this was used as a plane of dissection through the length of the incision using the hemostat and Bovie electrocautery.  Then, the  hemostat was used to make dissection down the tragal cartilage until the temporomandibular joint was encountered.  The mandibular clamp was placed percutaneously at the angle of the mandible to allow for movement of the jaw and condyle during surgery.  The dissection was carried over the zygomatic arch and the joint capsule was identified inferior to this.  Then, 15-blade was used to make an incision overlying the capsule, the joint was entered.  The Soil scientist was used to open the superior joint space.  The disk was grossly macerated in fragments.  Incision was made to the condylar neck and dissection was carried over the head of the condyle to dissect down in the remaining disk and fibrous tissue.  Then, Beaver blades and scissors were used to perform the diskectomy.  After the disk was removed, small fragments were removed using the pituitary rongeurs. Then, the joint was irrigated and the mouth was opened and closed, and found to have adequate tracking.  Then, a temporalis pedicle fascia graft was harvested using a 15-blade to make inferiorly based paddle carrying up into the temporal region.  The fascia was dissected down and then rotated into the joint space and sutured with 4-0 Mersilene.  Then, the jaw was opened and closed, found to have good tracking without interference.  Then, the capsule was closed with 4-0 Mersilene, deep layers were closed with 4-0 Vicryl, and the skin was closed with  5-0 Prolene.  Then, the head was turned and attention was turned to the left TMJ, similar procedure of marking local anesthetic and initial dissection was performed as was on the right side.  Once the joint was encountered, the disk was found to be anteriorly out of position with some thinning in the posterior area.  It was decided that the disk could be removed at this point.  Then, using Beaver blade and scissors, the disk was removed.  Then, the temporalis fascia graft was  rotated inferiorly and sutured into the joint using 4-0 Mersilene.  Then, the mouth was opened and closed.  There were no interferences, good tracking on both sides with adequate opening.  Then, the left side was closed with 4-0 Mersilene, 4-0 Vicryl and 5-0 Prolene.  Then, bacitracin, Telfa, Kerlix and fluffs were placed and Ace wrap was placed.  The patient was awakened, taken to the recovery room, breathing spontaneously in good condition.  ESTIMATED BLOOD LOSS:  100 mL.  COMPLICATIONS:  None.  SPECIMENS:  Right and left TMJ, disks.     Gae Bon, M.D.     SMJ/MEDQ  D:  12/23/2013  T:  12/23/2013  Job:  [id number]

## 2013-12-24 ENCOUNTER — Encounter (HOSPITAL_COMMUNITY): Payer: Self-pay | Admitting: Oral Surgery

## 2013-12-24 DIAGNOSIS — M2669 Other specified disorders of temporomandibular joint: Secondary | ICD-10-CM | POA: Diagnosis not present

## 2013-12-24 MED ORDER — ONDANSETRON HCL 4 MG PO TABS: 4.0000 mg | ORAL_TABLET | Freq: Three times a day (TID) | ORAL | Status: DC | PRN

## 2013-12-24 NOTE — Progress Notes (Signed)
Utilization Review Completed.Ezriel Boffa T7/29/2015  

## 2013-12-24 NOTE — Progress Notes (Signed)
Pt. Discharged to home. Discharge instructions given to patient.  No questions verbalized.

## 2013-12-25 ENCOUNTER — Encounter (HOSPITAL_COMMUNITY): Payer: Self-pay | Admitting: Emergency Medicine

## 2013-12-25 ENCOUNTER — Emergency Department (HOSPITAL_COMMUNITY)
Admission: EM | Admit: 2013-12-25 | Discharge: 2013-12-25 | Disposition: A | Discharge status: Home / Self Care | Payer: 59 | Attending: Emergency Medicine | Admitting: Emergency Medicine

## 2013-12-25 DIAGNOSIS — F410 Panic disorder [episodic paroxysmal anxiety] without agoraphobia: Secondary | ICD-10-CM | POA: Diagnosis not present

## 2013-12-25 DIAGNOSIS — IMO0002 Reserved for concepts with insufficient information to code with codable children: Secondary | ICD-10-CM | POA: Diagnosis not present

## 2013-12-25 DIAGNOSIS — Z8739 Personal history of other diseases of the musculoskeletal system and connective tissue: Secondary | ICD-10-CM | POA: Diagnosis not present

## 2013-12-25 DIAGNOSIS — F3289 Other specified depressive episodes: Secondary | ICD-10-CM | POA: Insufficient documentation

## 2013-12-25 DIAGNOSIS — R002 Palpitations: Secondary | ICD-10-CM | POA: Insufficient documentation

## 2013-12-25 DIAGNOSIS — R0602 Shortness of breath: Secondary | ICD-10-CM | POA: Insufficient documentation

## 2013-12-25 DIAGNOSIS — Z8639 Personal history of other endocrine, nutritional and metabolic disease: Secondary | ICD-10-CM | POA: Insufficient documentation

## 2013-12-25 DIAGNOSIS — Z880 Allergy status to penicillin: Secondary | ICD-10-CM | POA: Diagnosis not present

## 2013-12-25 DIAGNOSIS — J029 Acute pharyngitis, unspecified: Secondary | ICD-10-CM | POA: Diagnosis not present

## 2013-12-25 DIAGNOSIS — R079 Chest pain, unspecified: Secondary | ICD-10-CM | POA: Diagnosis not present

## 2013-12-25 DIAGNOSIS — Z8679 Personal history of other diseases of the circulatory system: Secondary | ICD-10-CM | POA: Diagnosis not present

## 2013-12-25 DIAGNOSIS — Z79899 Other long term (current) drug therapy: Secondary | ICD-10-CM | POA: Insufficient documentation

## 2013-12-25 DIAGNOSIS — F329 Major depressive disorder, single episode, unspecified: Secondary | ICD-10-CM | POA: Insufficient documentation

## 2013-12-25 DIAGNOSIS — G8929 Other chronic pain: Secondary | ICD-10-CM | POA: Insufficient documentation

## 2013-12-25 DIAGNOSIS — Z87448 Personal history of other diseases of urinary system: Secondary | ICD-10-CM | POA: Insufficient documentation

## 2013-12-25 DIAGNOSIS — Z862 Personal history of diseases of the blood and blood-forming organs and certain disorders involving the immune mechanism: Secondary | ICD-10-CM | POA: Diagnosis not present

## 2013-12-25 MED ORDER — ALPRAZOLAM 0.5 MG PO TABS
0.5000 mg | ORAL_TABLET | Freq: Once | ORAL | Status: AC
  Administered 2013-12-25: 0.5 mg via ORAL
  Filled 2013-12-25: qty 1

## 2013-12-25 NOTE — Discharge Summary (Signed)
Patient ID: Casey Cox MRN: [id number] DOB/AGE: Oct 17, 1975 38 y.o.  Admit date: 12/23/2013 Discharge date: 12/25/2013  Admission Diagnoses: Post op pain  Discharge Diagnoses:  Active Problems:   Post-operative pain   Discharged Condition: good  Hospital Course: Patient underwent Bilateral TMJ surgery 12/23/2013. Admitted for 23 hour observation for pain management. Uneventful hospital course.  Consults: None  Significant Diagnostic Studies: None   Treatments: IV hydration, antibiotics: Ancef, analgesia: Dilaudid, steroids: solu-medrol and surgery: TMJ Arthrotomy  Discharge Exam: Blood pressure 115/65, pulse 88, temperature 98 F (36.7 C), temperature source Oral, resp. rate 18, height 5\' 3"  (1.6 m), weight 83.915 kg (185 lb), last menstrual period 12/04/2013, SpO2 96.00%. General appearance: alert, cooperative and no distress Head: Normocephalic, without obvious abnormality, atraumatic Eyes: negative Nose: Nares normal. Septum midline. Mucosa normal. No drainage or sinus tenderness. Throat: lips, mucosa, and tongue normal; teeth and gums normal and expected mal-occlusion secondary to joint surgery. No trismus. Pharynx clear Neck: no adenopathy and supple, symmetrical, trachea midline Cardio: regular rate and rhythm, S1, S2 normal, no murmur, click, rub or gallop Decreased left temporal branch facial nerve  Disposition: 01-Home or Self Care  Discharge Instructions   Call MD for:  difficulty breathing, headache or visual disturbances    Complete by:  As directed      Call MD for:  difficulty breathing, headache or visual disturbances    Complete by:  As directed      Call MD for:  persistant nausea and vomiting    Complete by:  As directed      Call MD for:  persistant nausea and vomiting    Complete by:  As directed      Call MD for:  severe uncontrolled pain    Complete by:  As directed      Call MD for:  severe uncontrolled pain    Complete by:  As directed       Call MD for:  temperature >100.4    Complete by:  As directed      Call MD for:  temperature >100.4    Complete by:  As directed      Diet - low sodium heart healthy    Complete by:  As directed   Soft Diet. Advance as tolerated.     Diet - low sodium heart healthy    Complete by:  As directed   Soft Diet. Advance as tolerated.     Discharge instructions    Complete by:  As directed   Remove head bandage after 2 days.  Ice to TMJ for 3-4 days. Keep sutures dry Soft diet Mouth opening and closing and stretching exercises beginning tomorrow.     Discharge instructions    Complete by:  As directed   Ice to jaws for 3-4 days. Soft diet. Keep sutures dry Call [phone] for suture removal appointment for next Tuesday.     Increase activity slowly    Complete by:  As directed      Increase activity slowly    Complete by:  As directed             Medication List         ALPRAZolam 0.5 MG tablet  Commonly known as:  XANAX  Take 0.5 mg by mouth at bedtime as needed for sleep.     cyclobenzaprine 10 MG tablet  Commonly known as:  FLEXERIL  Take 20 mg by mouth at bedtime as needed for muscle spasms (and sleep  prn).     hydroquinone 2 % cream  Apply 1 application topically every evening.     ondansetron 4 MG tablet  Commonly known as:  ZOFRAN  Take 1 tablet (4 mg total) by mouth every 8 (eight) hours as needed for nausea or vomiting.     ondansetron 4 MG tablet  Commonly known as:  ZOFRAN  Take 1 tablet (4 mg total) by mouth every 8 (eight) hours as needed for nausea or vomiting.     oxyCODONE-acetaminophen 10-325 MG per tablet  Commonly known as:  PERCOCET  Take 1-2 tablets by mouth every 4 (four) hours as needed for pain.     tretinoin 0.025 % cream  Commonly known as:  RETIN-A  Apply 1 application topically at bedtime.     triamcinolone cream 0.1 %  Commonly known as:  KENALOG  Apply 1 application topically 2 (two) times daily as needed (for rash).          SignedGae Bon 12/25/2013, 9:13 AM

## 2013-12-25 NOTE — ED Provider Notes (Signed)
CSN: [id number]     Arrival date & time 12/25/13  1012 History   First MD Initiated Contact with Patient 12/25/13 1031     Chief Complaint  Patient presents with  . Panic Attack   HPI  Patient is a 38 y.o. Female who presents to the ED with shortness of breath and feelings of inability to swallow.  Patient is s/p bilateral TMJ arthopathy on 12/23/13 by Dr. Stefanie Libel for bilateral TMJ osteoarthritis.  Patient states that this morning she felt like she had a sore throat and couldn't breath and was struggling to swallow.  This was accompanied by numbness and tingling in her hands and face.  Patient states that she has a history of anxiety and panic attacks and this feels similar to a panic attack but she was unsure whether she could take her xanax with her medications that she was prescribed for her surgery.  Patient also admits to a sore throat.  She was intubated during her surgery.  Patient is currently taking percocet and zofran from her surgery.  She states that she has not had fever, chills, nausea, vomiting, discharge or redness from her surgical site.  She does admit to some facial swelling and pain.    Past Medical History  Diagnosis Date  . Menorrhagia   . Patellofemoral syndrome   . Fibromyalgia   . Anemia     h/o anemia  . Anxiety   . 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 Missed abortions     x 2 - no surgery required  . Infertility     HX  . Migraine     "I have a few/month" (12/23/2013)  . Arthritis     "knees,hips, ankles, hands, jaw" (12/23/2013)  . 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 Chronic lower back pain     "recently" (12/23/2013)  . Depression     "hx" (12/23/2013)   Past Surgical History  Procedure Laterality Date  . Mandible fracture surgery  06/30/12    ARTHOCENTESIS OF THE JAW(tmj)  . Refractive surgery  12/2006  . Wisdom tooth extraction  ~ 2005  . Laparoscopy N/A 06/24/2013    Procedure: LAPAROSCOPY OPERATIVE WITH CHROMOPERTUBATION, LYSIS OF ADHESIONS AND  APPENDECTOMY ;  Surgeon: Linda Hedges, DO;  Location: [facility];  Service: Gynecology;  Laterality: N/A;  . Bilateral temporomandibular joint arthroplasty  12/23/2013  . Appendectomy  06/24/2013  . Arthrotomy Bilateral 12/23/2013    Procedure: BILATERAL ARTHROTOMY, MENISECTOMY, TEMPORALIS FASCIA GRAFT;  Surgeon: Gae Bon, DDS;  Location: [facility];  Service: Oral Surgery;  Laterality: Bilateral;   Family History  Problem Relation Age of Onset  . Fibromyalgia Mother   . Arthritis Mother   . Cancer Mother     SKIN CA  . Diabetes Father   . Arthritis Father   . Mental illness Maternal Grandmother   . Cancer Maternal Grandfather     PROSTATE CA  . Diabetes Maternal Grandfather   . Diabetes Paternal Grandmother   . Hyperlipidemia Paternal Grandmother   . Hypertension Paternal Grandmother   . Hypertension Paternal Grandfather   . Arthritis Paternal Grandfather   . Heart disease Paternal Grandfather    History  Substance Use Topics  . Smoking status: Never Smoker   . Smokeless tobacco: Never Used  . Alcohol Use: 1.2 oz/week    2 Glasses of wine per week   OB History   Grav Para Term Preterm Abortions TAB SAB Ect Mult Living  2    2  2    0     Review of Systems  Constitutional: Negative for fever, chills and fatigue.  HENT: Positive for facial swelling, sore throat and trouble swallowing. Negative for congestion, drooling, ear discharge, ear pain and voice change.   Respiratory: Positive for chest tightness and shortness of breath. Negative for cough, wheezing and stridor.   Cardiovascular: Positive for palpitations. Negative for chest pain and leg swelling.  Gastrointestinal: Negative for nausea and vomiting.  Neurological: Positive for numbness.  All other systems reviewed and are negative.     Allergies  Amoxicillin; Penicillins; and Nickel  Home Medications   Prior to Admission medications   Medication Sig Start Date End Date Taking? Authorizing Provider  ALPRAZolam  Duanne Moron) 0.5 MG tablet Take 0.5 mg by mouth at bedtime as needed for sleep.     Historical Provider, MD  cyclobenzaprine (FLEXERIL) 10 MG tablet Take 20 mg by mouth at bedtime as needed for muscle spasms (and sleep prn).    Historical Provider, MD  hydroquinone 2 % cream Apply 1 application topically every evening.    Historical Provider, MD  ondansetron (ZOFRAN) 4 MG tablet Take 1 tablet (4 mg total) by mouth every 8 (eight) hours as needed for nausea or vomiting. 04/28/13   Lucretia Kern, DO  ondansetron (ZOFRAN) 4 MG tablet Take 1 tablet (4 mg total) by mouth every 8 (eight) hours as needed for nausea or vomiting. 12/24/13   Gae Bon, DDS  oxyCODONE-acetaminophen (PERCOCET) 10-325 MG per tablet Take 1-2 tablets by mouth every 4 (four) hours as needed for pain. 12/23/13   Gae Bon, DDS  tretinoin (RETIN-A) 0.025 % cream Apply 1 application topically at bedtime.    Historical Provider, MD  triamcinolone cream (KENALOG) 0.1 % Apply 1 application topically 2 (two) times daily as needed (for rash).    Historical Provider, MD   BP 134/86  Pulse 100  Temp(Src) 98.2 F (36.8 C) (Oral)  Resp 18  SpO2 100%  LMP 12/04/2013 Physical Exam  Nursing note and vitals reviewed. Constitutional: She is oriented to person, place, and time. She appears well-developed and well-nourished. No distress.  HENT:  Head: Normocephalic. Head is with contusion.    Right Ear: Tympanic membrane and external ear normal.  Left Ear: Tympanic membrane and external ear normal.  Nose: Nose normal.  Mouth/Throat: Uvula is midline, oropharynx is clear and moist and mucous membranes are normal. No trismus in the jaw. No oropharyngeal exudate, posterior oropharyngeal edema, posterior oropharyngeal erythema or tonsillar abscesses.  Well healing surgical incisions preauricular bilaterally. Dressing is clean and dry and there is no surrounding redness or erythema.  No discharge observed.  Eyes: Conjunctivae and EOM are  normal. Pupils are equal, round, and reactive to light. No scleral icterus.  Neck: Normal range of motion. Neck supple.  Cardiovascular: Normal rate, regular rhythm, normal heart sounds and intact distal pulses.  Exam reveals no gallop and no friction rub.   No murmur heard. Pulmonary/Chest: Effort normal and breath sounds normal. No respiratory distress. She has no wheezes. She has no rales. She exhibits no tenderness.  Neurological: She is alert and oriented to person, place, and time. No cranial nerve deficit. Coordination normal.  Skin: Skin is warm and dry. She is not diaphoretic.  Psychiatric: She has a normal mood and affect. Her behavior is normal. Judgment and thought content normal.    ED Course  Procedures (including critical care time) Labs Review Labs  Reviewed - No data to display  Imaging Review No results found.   EKG Interpretation None      MDM   Final diagnoses:  Panic attack   Patient presents to the ED with shortness of breath and trouble swallowing.  Patient's symptoms consistent with Panic attack.  She is status post TMJ arthrotomy.  Incisions appear to be clean and dry.  She is afebrile at this time.  There are no meningeal signs or trismus.  Patient was given xanax here with good relief of symptoms.  She was told that it was okay to take with her post operative meds and to watch for any signs of respiratory depression.  She was reassured that her surgical incisions appeared to be in good condition.  Patient is stable for discharge at this time.  She is tolerating PO fluids here.  HR is within normal limits and SpO2 is 96% on room air.  Patient was told to watch for signs of infection, trismus, and drooling.  She states understanding and agreement at this time.  She was told to follow-up with her surgeon.        Cherylann Parr, PA-C 12/25/13 2030

## 2013-12-25 NOTE — ED Notes (Addendum)
Pt just had arthroplasty on TMJ.  Had surgery Tuesday morning.  States hx of panic attacks.  States she started having a panic attack after her wife left for work this morning.  Pt states that she feels that her throat is tight but it has felt this way after surgery d/t ET tube from surgery.  States that she has xanax to use when she has panic attacks but did not want to take one because she was not sure how it would interact with the other medications she is taking post surgery.  Pt is able to talk and mentate.  Pt A&O x 4.

## 2013-12-25 NOTE — ED Notes (Addendum)
Reviewed proper use of ice and proper use a benadryl with pt and family Wound care reviewed, bandage replaced

## 2013-12-25 NOTE — Discharge Instructions (Signed)

## 2013-12-26 NOTE — ED Provider Notes (Signed)
Medical screening examination/treatment/procedure(s) were performed by non-physician practitioner and as supervising physician I was immediately available for consultation/collaboration.   EKG Interpretation None        Malvin Johns, MD 12/26/13 [id number]

## 2014-01-06 ENCOUNTER — Encounter (HOSPITAL_BASED_OUTPATIENT_CLINIC_OR_DEPARTMENT_OTHER): Payer: Self-pay | Admitting: *Deleted

## 2014-01-09 ENCOUNTER — Encounter (HOSPITAL_BASED_OUTPATIENT_CLINIC_OR_DEPARTMENT_OTHER): Payer: Self-pay | Admitting: *Deleted

## 2014-01-12 ENCOUNTER — Ambulatory Visit: Payer: Self-pay | Admitting: Physician Assistant

## 2014-01-12 NOTE — Progress Notes (Signed)
Discussed with Dr. Conrad Emporium pt had Arthotomy on 12/23/2013 - ok for surgery.

## 2014-01-12 NOTE — H&P (Signed)
Casey Cox is an 38 y.o. female.   Chief Complaint: left ankle ligament tear HPI: 38yo female seen originally with multiple joint complaints.  Described instability bilateral ankle left greater than right mri confirmed complete tear anterior talofibular ligament.    Past Medical History  Diagnosis Date  . Menorrhagia   . Patellofemoral syndrome   . Fibromyalgia   . Anemia     h/o anemia  . Anxiety   . 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 Missed abortions     x 2 - no surgery required  . Infertility     HX  . Migraine     "I have a few/month" (12/23/2013)  . Arthritis     "knees,hips, ankles, hands, jaw" (12/23/2013)  . 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 Chronic lower back pain     "recently" (12/23/2013)    Past Surgical History  Procedure Laterality Date  . Mandible fracture surgery  06/30/12    ARTHOCENTESIS OF THE JAW(tmj)  . Refractive surgery  12/2006  . Wisdom tooth extraction  ~ 2005  . Laparoscopy N/A 06/24/2013    Procedure: LAPAROSCOPY OPERATIVE WITH CHROMOPERTUBATION, LYSIS OF ADHESIONS AND APPENDECTOMY ;  Surgeon: Linda Hedges, DO;  Location: [facility];  Service: Gynecology;  Laterality: N/A;  . Appendectomy  06/24/2013  . Arthrotomy Bilateral 12/23/2013    Procedure: BILATERAL ARTHROTOMY, MENISECTOMY, TEMPORALIS FASCIA GRAFT;  Surgeon: Gae Bon, DDS;  Location: [facility];  Service: Oral Surgery;  Laterality: Bilateral;  . Eye surgery      Lasix both eyes    Family History  Problem Relation Age of Onset  . Fibromyalgia Mother   . Arthritis Mother   . Cancer Mother     SKIN CA  . Diabetes Father   . Arthritis Father   . Mental illness Maternal Grandmother   . Cancer Maternal Grandfather     PROSTATE CA  . Diabetes Maternal Grandfather   . Diabetes Paternal Grandmother   . Hyperlipidemia Paternal Grandmother   . Hypertension Paternal Grandmother   . Hypertension Paternal Grandfather   . Arthritis Paternal Grandfather   . Heart disease Paternal  Grandfather    Social History:  reports that she has never smoked. She has never used smokeless tobacco. She reports that she drinks about 1.2 ounces of alcohol per week. She reports that she does not use illicit drugs.  Allergies:  Allergies  Allergen Reactions  . Amoxicillin Rash  . Penicillins Rash  . Nickel Rash     (Not in a hospital admission)  No results found for this or any previous visit (from the past 48 hour(s)). No results found.  Review of Systems  Constitutional: Negative.   HENT: Positive for tinnitus. Negative for congestion, ear discharge, ear pain, hearing loss, nosebleeds and sore throat.   Eyes: Negative.   Respiratory: Negative.  Negative for stridor.   Cardiovascular: Negative.   Gastrointestinal: Negative.   Genitourinary: Positive for dysuria. Negative for urgency, frequency, hematuria and flank pain.  Musculoskeletal: Positive for falls and joint pain.  Skin: Negative.   Neurological: Positive for headaches. Negative for dizziness, tingling, tremors, sensory change, speech change, focal weakness, seizures and loss of consciousness.  Endo/Heme/Allergies: Negative.   Psychiatric/Behavioral: Negative.     Last menstrual period 12/04/2013. Physical Exam  Constitutional: She is oriented to person, place, and time. She appears well-developed and well-nourished. No distress.  HENT:  Head: Normocephalic and atraumatic.  Nose: Nose normal.  Eyes: Conjunctivae and EOM are normal. Pupils are equal, round, and reactive to light.  Neck: Normal range of motion. Neck supple.  Cardiovascular: Normal rate and intact distal pulses.   Respiratory: Effort normal. No respiratory distress. She has no wheezes.  GI: Soft. She exhibits no distension. There is no tenderness.  Musculoskeletal:       Left ankle: She exhibits swelling. Tenderness. AITFL tenderness found.  Neurological: She is alert and oriented to person, place, and time.  Skin: Skin is warm and dry. No  rash noted. No erythema.  Psychiatric: She has a normal mood and affect. Her behavior is normal.     Assessment/Plan Left ankle anterior talofibular ligament tear with instability  Discussed ligament repair with antograft peroneal tendon patient wishes to proceed with will be done outpatient.  General ansthesia ankle block.    Chriss Czar 01/12/2014, 5:21 PM

## 2014-01-13 ENCOUNTER — Ambulatory Visit (HOSPITAL_BASED_OUTPATIENT_CLINIC_OR_DEPARTMENT_OTHER): Payer: 59 | Admitting: Anesthesiology

## 2014-01-13 ENCOUNTER — Ambulatory Visit (HOSPITAL_BASED_OUTPATIENT_CLINIC_OR_DEPARTMENT_OTHER)
Admission: RE | Admit: 2014-01-13 | Discharge: 2014-01-13 | Disposition: A | Discharge status: Home / Self Care | Payer: 59 | Source: Ambulatory Visit | Attending: Orthopedic Surgery | Admitting: Orthopedic Surgery

## 2014-01-13 ENCOUNTER — Encounter (HOSPITAL_BASED_OUTPATIENT_CLINIC_OR_DEPARTMENT_OTHER): Payer: 59 | Admitting: Anesthesiology

## 2014-01-13 ENCOUNTER — Encounter (HOSPITAL_BASED_OUTPATIENT_CLINIC_OR_DEPARTMENT_OTHER): Payer: Self-pay

## 2014-01-13 ENCOUNTER — Encounter (HOSPITAL_BASED_OUTPATIENT_CLINIC_OR_DEPARTMENT_OTHER)
Admission: RE | Disposition: A | Discharge status: Home / Self Care | Payer: Self-pay | Source: Ambulatory Visit | Attending: Orthopedic Surgery

## 2014-01-13 DIAGNOSIS — S93499A Sprain of other ligament of unspecified ankle, initial encounter: Secondary | ICD-10-CM | POA: Diagnosis not present

## 2014-01-13 DIAGNOSIS — G8929 Other chronic pain: Secondary | ICD-10-CM | POA: Insufficient documentation

## 2014-01-13 DIAGNOSIS — N920 Excessive and frequent menstruation with regular cycle: Secondary | ICD-10-CM | POA: Insufficient documentation

## 2014-01-13 DIAGNOSIS — G43909 Migraine, unspecified, not intractable, without status migrainosus: Secondary | ICD-10-CM | POA: Insufficient documentation

## 2014-01-13 DIAGNOSIS — M24873 Other specific joint derangements of unspecified ankle, not elsewhere classified: Secondary | ICD-10-CM | POA: Diagnosis present

## 2014-01-13 DIAGNOSIS — Z880 Allergy status to penicillin: Secondary | ICD-10-CM | POA: Insufficient documentation

## 2014-01-13 DIAGNOSIS — IMO0001 Reserved for inherently not codable concepts without codable children: Secondary | ICD-10-CM | POA: Insufficient documentation

## 2014-01-13 DIAGNOSIS — M159 Polyosteoarthritis, unspecified: Secondary | ICD-10-CM | POA: Insufficient documentation

## 2014-01-13 DIAGNOSIS — M24876 Other specific joint derangements of unspecified foot, not elsewhere classified: Secondary | ICD-10-CM | POA: Diagnosis not present

## 2014-01-13 DIAGNOSIS — Z881 Allergy status to other antibiotic agents status: Secondary | ICD-10-CM | POA: Diagnosis not present

## 2014-01-13 DIAGNOSIS — M545 Low back pain, unspecified: Secondary | ICD-10-CM | POA: Insufficient documentation

## 2014-01-13 DIAGNOSIS — X58XXXA Exposure to other specified factors, initial encounter: Secondary | ICD-10-CM | POA: Diagnosis not present

## 2014-01-13 DIAGNOSIS — S96819A Strain of other specified muscles and tendons at ankle and foot level, unspecified foot, initial encounter: Secondary | ICD-10-CM

## 2014-01-13 DIAGNOSIS — Z9109 Other allergy status, other than to drugs and biological substances: Secondary | ICD-10-CM | POA: Insufficient documentation

## 2014-01-13 HISTORY — PX: LIGAMENT REPAIR: SHX5444

## 2014-01-13 SURGERY — REPAIR, LIGAMENT
Anesthesia: Regional | Site: Ankle | Laterality: Right

## 2014-01-13 MED ORDER — MIDAZOLAM HCL 2 MG/2ML IJ SOLN
1.0000 mg | INTRAMUSCULAR | Status: DC | PRN
  Administered 2014-01-13: 2 mg via INTRAVENOUS

## 2014-01-13 MED ORDER — MIDAZOLAM HCL 5 MG/5ML IJ SOLN
INTRAMUSCULAR | Status: DC | PRN
  Administered 2014-01-13: 2 mg via INTRAVENOUS

## 2014-01-13 MED ORDER — ONDANSETRON HCL 4 MG/2ML IJ SOLN
INTRAMUSCULAR | Status: DC | PRN
  Administered 2014-01-13: 4 mg via INTRAVENOUS

## 2014-01-13 MED ORDER — BUPIVACAINE HCL (PF) 0.5 % IJ SOLN
INTRAMUSCULAR | Status: AC
  Filled 2014-01-13: qty 30

## 2014-01-13 MED ORDER — BUPIVACAINE-EPINEPHRINE (PF) 0.5% -1:200000 IJ SOLN
INTRAMUSCULAR | Status: DC | PRN
  Administered 2014-01-13: 30 mL via PERINEURAL

## 2014-01-13 MED ORDER — OXYCODONE-ACETAMINOPHEN 5-325 MG PO TABS: 1.0000 | ORAL_TABLET | ORAL | Status: DC | PRN

## 2014-01-13 MED ORDER — PROPOFOL 10 MG/ML IV BOLUS
INTRAVENOUS | Status: AC
  Filled 2014-01-13: qty 20

## 2014-01-13 MED ORDER — FENTANYL CITRATE 0.05 MG/ML IJ SOLN
INTRAMUSCULAR | Status: AC
  Filled 2014-01-13: qty 2

## 2014-01-13 MED ORDER — OXYCODONE HCL 5 MG PO TABS
5.0000 mg | ORAL_TABLET | Freq: Once | ORAL | Status: DC | PRN
Start: 2014-01-13 — End: 2014-01-13

## 2014-01-13 MED ORDER — BUPIVACAINE HCL (PF) 0.25 % IJ SOLN
INTRAMUSCULAR | Status: DC | PRN
  Administered 2014-01-13: 20 mL

## 2014-01-13 MED ORDER — CLINDAMYCIN PHOSPHATE 900 MG/50ML IV SOLN
900.0000 mg | INTRAVENOUS | Status: AC
  Administered 2014-01-13: 900 mg via INTRAVENOUS

## 2014-01-13 MED ORDER — HYDROMORPHONE HCL PF 1 MG/ML IJ SOLN
0.2500 mg | INTRAMUSCULAR | Status: DC | PRN
  Administered 2014-01-13: 0.5 mg via INTRAVENOUS

## 2014-01-13 MED ORDER — SCOPOLAMINE 1 MG/3DAYS TD PT72
MEDICATED_PATCH | TRANSDERMAL | Status: AC
  Filled 2014-01-13: qty 1

## 2014-01-13 MED ORDER — FENTANYL CITRATE 0.05 MG/ML IJ SOLN
INTRAMUSCULAR | Status: DC | PRN
  Administered 2014-01-13: 100 ug via INTRAVENOUS
  Administered 2014-01-13: 25 ug via INTRAVENOUS
  Administered 2014-01-13: 50 ug via INTRAVENOUS
  Administered 2014-01-13: 25 ug via INTRAVENOUS

## 2014-01-13 MED ORDER — ONDANSETRON HCL 4 MG/2ML IJ SOLN: 4.0000 mg | Freq: Once | INTRAMUSCULAR | Status: DC | PRN

## 2014-01-13 MED ORDER — HYDROMORPHONE HCL PF 1 MG/ML IJ SOLN
INTRAMUSCULAR | Status: AC
  Filled 2014-01-13: qty 1

## 2014-01-13 MED ORDER — LIDOCAINE HCL (CARDIAC) 20 MG/ML IV SOLN
INTRAVENOUS | Status: DC | PRN
  Administered 2014-01-13: 70 mg via INTRAVENOUS

## 2014-01-13 MED ORDER — CLINDAMYCIN PHOSPHATE 900 MG/50ML IV SOLN
INTRAVENOUS | Status: AC
  Filled 2014-01-13: qty 50

## 2014-01-13 MED ORDER — 0.9 % SODIUM CHLORIDE (POUR BTL) OPTIME
TOPICAL | Status: DC | PRN
  Administered 2014-01-13: 1000 mL

## 2014-01-13 MED ORDER — MIDAZOLAM HCL 2 MG/2ML IJ SOLN
INTRAMUSCULAR | Status: AC
  Filled 2014-01-13: qty 2

## 2014-01-13 MED ORDER — LACTATED RINGERS IV SOLN
INTRAVENOUS | Status: DC
  Administered 2014-01-13 (×2): via INTRAVENOUS

## 2014-01-13 MED ORDER — LIDOCAINE HCL (PF) 1 % IJ SOLN
INTRAMUSCULAR | Status: AC
  Filled 2014-01-13: qty 30

## 2014-01-13 MED ORDER — FENTANYL CITRATE 0.05 MG/ML IJ SOLN
50.0000 ug | INTRAMUSCULAR | Status: DC | PRN
  Administered 2014-01-13: 100 ug via INTRAVENOUS

## 2014-01-13 MED ORDER — MIDAZOLAM HCL 2 MG/2ML IJ SOLN
INTRAMUSCULAR | Status: AC
Start: 2014-01-13 — End: 2014-01-13
  Filled 2014-01-13: qty 2

## 2014-01-13 MED ORDER — SCOPOLAMINE 1 MG/3DAYS TD PT72
1.0000 | MEDICATED_PATCH | TRANSDERMAL | Status: DC
  Administered 2014-01-13: 1.5 mg via TRANSDERMAL

## 2014-01-13 MED ORDER — FENTANYL CITRATE 0.05 MG/ML IJ SOLN
INTRAMUSCULAR | Status: AC
  Filled 2014-01-13: qty 6

## 2014-01-13 MED ORDER — PROPOFOL 10 MG/ML IV BOLUS
INTRAVENOUS | Status: DC | PRN
  Administered 2014-01-13: 200 mg via INTRAVENOUS

## 2014-01-13 MED ORDER — CHLORHEXIDINE GLUCONATE 4 % EX LIQD: 60.0000 mL | Freq: Once | CUTANEOUS | Status: DC

## 2014-01-13 MED ORDER — OXYCODONE HCL 5 MG/5ML PO SOLN: 5.0000 mg | Freq: Once | ORAL | Status: DC | PRN

## 2014-01-13 MED ORDER — SODIUM CHLORIDE 0.9 % IV SOLN: INTRAVENOUS | Status: DC

## 2014-01-13 MED ORDER — DEXAMETHASONE SODIUM PHOSPHATE 10 MG/ML IJ SOLN
INTRAMUSCULAR | Status: DC | PRN
  Administered 2014-01-13: 10 mg via INTRAVENOUS

## 2014-01-13 MED ORDER — KETOROLAC TROMETHAMINE 30 MG/ML IJ SOLN
INTRAMUSCULAR | Status: DC | PRN
  Administered 2014-01-13: 30 mg via INTRAVENOUS

## 2014-01-13 SURGICAL SUPPLY — 90 items
BANDAGE ELASTIC 4 VELCRO ST LF (GAUZE/BANDAGES/DRESSINGS) ×4 IMPLANT
BANDAGE ELASTIC 6 VELCRO ST LF (GAUZE/BANDAGES/DRESSINGS) IMPLANT
BANDAGE ESMARK 6X9 LF (GAUZE/BANDAGES/DRESSINGS) ×1 IMPLANT
BIT DRILL 3/32DIAX5INL DISPOSE (BIT) ×1 IMPLANT
BIT DRILL 3/32DX5IN DISP (BIT) ×1
BIT DRILL 5/64X5 DISP (BIT) ×2 IMPLANT
BIT DRILL 7/64X5 DISP (BIT) ×2 IMPLANT
BIT DRILL JACOB END 9/64INX5IN (BIT) ×2 IMPLANT
BLADE SURG 15 STRL LF DISP TIS (BLADE) ×2 IMPLANT
BLADE SURG 15 STRL SS (BLADE) ×2
BNDG ESMARK 4X9 LF (GAUZE/BANDAGES/DRESSINGS) IMPLANT
BNDG ESMARK 6X9 LF (GAUZE/BANDAGES/DRESSINGS) ×2
BNDG GAUZE ELAST 4 BULKY (GAUZE/BANDAGES/DRESSINGS) IMPLANT
CANISTER SUCT 1200ML W/VALVE (MISCELLANEOUS) ×2 IMPLANT
COVER TABLE BACK 60X90 (DRAPES) ×2 IMPLANT
DECANTER SPIKE VIAL GLASS SM (MISCELLANEOUS) ×2 IMPLANT
DEPRESSOR TONGUE BLADE STERILE (MISCELLANEOUS) ×2 IMPLANT
DRAPE EXTREMITY T 121X128X90 (DRAPE) ×2 IMPLANT
DRAPE OEC MINIVIEW 54X84 (DRAPES) ×2 IMPLANT
DRAPE U 20/CS (DRAPES) ×2 IMPLANT
DRAPE U-SHAPE 47X51 STRL (DRAPES) ×2 IMPLANT
DRILL BIT 1/8DIAX5INL DISPOSE (BIT) ×2 IMPLANT
DRILL BIT 3/32DIAX5INL DISPOSE (BIT) ×1
DRSG EMULSION OIL 3X3 NADH (GAUZE/BANDAGES/DRESSINGS) ×2 IMPLANT
DRSG PAD ABDOMINAL 8X10 ST (GAUZE/BANDAGES/DRESSINGS) ×2 IMPLANT
DURAPREP 26ML APPLICATOR (WOUND CARE) ×2 IMPLANT
ELECT REM PT RETURN 9FT ADLT (ELECTROSURGICAL) ×2
ELECTRODE REM PT RTRN 9FT ADLT (ELECTROSURGICAL) ×1 IMPLANT
GAUZE SPONGE 4X4 12PLY STRL (GAUZE/BANDAGES/DRESSINGS) ×2 IMPLANT
GLOVE BIO SURGEON STRL SZ 6.5 (GLOVE) ×2 IMPLANT
GLOVE BIO SURGEON STRL SZ7.5 (GLOVE) ×2 IMPLANT
GLOVE BIOGEL PI IND STRL 7.0 (GLOVE) ×1 IMPLANT
GLOVE BIOGEL PI IND STRL 8 (GLOVE) ×2 IMPLANT
GLOVE BIOGEL PI IND STRL 8.5 (GLOVE) IMPLANT
GLOVE BIOGEL PI INDICATOR 7.0 (GLOVE) ×1
GLOVE BIOGEL PI INDICATOR 8 (GLOVE) ×2
GLOVE BIOGEL PI INDICATOR 8.5 (GLOVE)
GLOVE ECLIPSE 7.5 STRL STRAW (GLOVE) IMPLANT
GLOVE EXAM NITRILE LRG STRL (GLOVE) ×2 IMPLANT
GLOVE SURG ORTHO 8.0 STRL STRW (GLOVE) ×2 IMPLANT
GOWN STRL REUS W/ TWL LRG LVL3 (GOWN DISPOSABLE) ×1 IMPLANT
GOWN STRL REUS W/ TWL XL LVL3 (GOWN DISPOSABLE) ×2 IMPLANT
GOWN STRL REUS W/TWL LRG LVL3 (GOWN DISPOSABLE) ×1
GOWN STRL REUS W/TWL XL LVL3 (GOWN DISPOSABLE) ×2
KIT BIO-TENODESIS 3X8 DISP (MISCELLANEOUS)
KIT INSRT BABSR STRL DISP BTN (MISCELLANEOUS) IMPLANT
LOOP VESSEL MAXI BLUE (MISCELLANEOUS) ×2 IMPLANT
NEEDLE HYPO 25X1 1.5 SAFETY (NEEDLE) ×2 IMPLANT
NS IRRIG 1000ML POUR BTL (IV SOLUTION) ×2 IMPLANT
PACK BASIN DAY SURGERY FS (CUSTOM PROCEDURE TRAY) ×2 IMPLANT
PAD CAST 4YDX4 CTTN HI CHSV (CAST SUPPLIES) ×1 IMPLANT
PADDING CAST ABS 3INX4YD NS (CAST SUPPLIES)
PADDING CAST ABS 4INX4YD NS (CAST SUPPLIES)
PADDING CAST ABS COTTON 3X4 (CAST SUPPLIES) IMPLANT
PADDING CAST ABS COTTON 4X4 ST (CAST SUPPLIES) IMPLANT
PADDING CAST COTTON 4X4 STRL (CAST SUPPLIES) ×1
PADDING CAST COTTON 6X4 STRL (CAST SUPPLIES) ×2 IMPLANT
PENCIL BUTTON HOLSTER BLD 10FT (ELECTRODE) ×2 IMPLANT
SHEET MEDIUM DRAPE 40X70 STRL (DRAPES) IMPLANT
SPLINT FAST PLASTER 5X30 (CAST SUPPLIES) ×30
SPLINT PLASTER CAST FAST 5X30 (CAST SUPPLIES) ×30 IMPLANT
SPONGE LAP 4X18 X RAY DECT (DISPOSABLE) ×2 IMPLANT
STAPLER VISISTAT 35W (STAPLE) IMPLANT
STOCKINETTE 6  STRL (DRAPES) ×1
STOCKINETTE 6 STRL (DRAPES) ×1 IMPLANT
SUCTION FRAZIER TIP 10 FR DISP (SUCTIONS) IMPLANT
SUT 2 FIBERLOOP 20 STRT BLUE (SUTURE)
SUT ETHILON 3 0 PS 1 (SUTURE) ×4 IMPLANT
SUT ETHILON 4 0 PS 2 18 (SUTURE) IMPLANT
SUT FIBERWIRE #2 38 T-5 BLUE (SUTURE)
SUT FIBERWIRE 2-0 18 17.9 3/8 (SUTURE) ×8
SUT VIC AB 0 CT1 27 (SUTURE)
SUT VIC AB 0 CT1 27XBRD ANBCTR (SUTURE) IMPLANT
SUT VIC AB 2-0 CT1 27 (SUTURE)
SUT VIC AB 2-0 CT1 TAPERPNT 27 (SUTURE) IMPLANT
SUT VIC AB 2-0 PS2 27 (SUTURE) IMPLANT
SUT VIC AB 2-0 SH 27 (SUTURE) ×1
SUT VIC AB 2-0 SH 27XBRD (SUTURE) ×1 IMPLANT
SUT VIC AB 3-0 SH 27 (SUTURE)
SUT VIC AB 3-0 SH 27X BRD (SUTURE) IMPLANT
SUTURE 2 FIBERLOOP 20 STRT BLU (SUTURE) IMPLANT
SUTURE FIBERWR #2 38 T-5 BLUE (SUTURE) IMPLANT
SUTURE FIBERWR 2-0 18 17.9 3/8 (SUTURE) ×4 IMPLANT
SYR BULB 3OZ (MISCELLANEOUS) ×2 IMPLANT
SYR CONTROL 10ML LL (SYRINGE) IMPLANT
SYRINGE CONTROL L 12CC (SYRINGE) ×2 IMPLANT
TOWEL OR 17X24 6PK STRL BLUE (TOWEL DISPOSABLE) ×4 IMPLANT
TUBE CONNECTING 20X1/4 (TUBING) IMPLANT
UNDERPAD 30X30 INCONTINENT (UNDERPADS AND DIAPERS) ×2 IMPLANT
YANKAUER SUCT BULB TIP NO VENT (SUCTIONS) IMPLANT

## 2014-01-13 NOTE — Interval H&P Note (Signed)
History and Physical Interval Note:  01/13/2014 7:26 AM  Casey Cox  has presented today for surgery, with the diagnosis of RIGHT ANKLE SPRAIN AND STRAIN OF ANKLE LIGAMENT  The various methods of treatment have been discussed with the patient and family. After consideration of risks, benefits and other options for treatment, the patient has consented to  Procedure(s): [place] (Right) as a surgical intervention .  The patient's history has been reviewed, patient examined, no change in status, stable for surgery.  I have reviewed the patient's chart and labs.  Questions were answered to the patient's satisfaction.     Shenica Holzheimer JR,W D

## 2014-01-13 NOTE — Discharge Instructions (Signed)
Post Anesthesia Home Care Instructions  Activity: Get plenty of rest for the remainder of the day. A responsible adult should stay with you for 24 hours following the procedure.  For the next 24 hours, DO NOT: -Drive a car -Paediatric nurse -Drink alcoholic beverages -Take any medication unless instructed by your physician -Make any legal decisions or sign important papers.  Meals: Start with liquid foods such as gelatin or soup. Progress to regular foods as tolerated. Avoid greasy, spicy, heavy foods. If nausea and/or vomiting occur, drink only clear liquids until the nausea and/or vomiting subsides. Call your physician if vomiting continues.  Special Instructions/Symptoms: Your throat may feel dry or sore from the anesthesia or the breathing tube placed in your throat during surgery. If this causes discomfort, gargle with warm salt water. The discomfort should disappear within 24 hours.  Regional Anesthesia Blocks  1. Numbness or the inability to move the "blocked" extremity may last from 3-48 hours after placement. The length of time depends on the medication injected and your individual response to the medication. If the numbness is not going away after 48 hours, call your surgeon.  2. The extremity that is blocked will need to be protected until the numbness is gone and the  Strength has returned. Because you cannot feel it, you will need to take extra care to avoid injury. Because it may be weak, you may have difficulty moving it or using it. You may not know what position it is in without looking at it while the block is in effect.  3. For blocks in the legs and feet, returning to weight bearing and walking needs to be done carefully. You will need to wait until the numbness is entirely gone and the strength has returned. You should be able to move your leg and foot normally before you try and bear weight or walk. You will need someone to be with you when you first try to ensure you  do not fall and possibly risk injury.  4. Bruising and tenderness at the needle site are common side effects and will resolve in a few days.  5. Persistent numbness or new problems with movement should be communicated to the surgeon or the [facility] [phone] [place] [phone]).  Diet: As you were doing prior to hospitalization   Activity: Increase activity slowly as tolerated  No lifting or driving for 6 weeks   Shower: May shower but need to keep splint covered and dry.  Dressing: Leave splint in place until follow up.  Keep clean and dry.   Weight Bearing: nonweight bearing right leg. Use a walker or  Crutches.  To prevent constipation: you may use a stool softener such as -  Colace ( over the counter) 100 mg by mouth twice a day  Drink plenty of fluids ( prune juice may be helpful) and high fiber foods  Miralax ( over the counter) for constipation as needed.   Precautions: If you experience chest pain or shortness of breath - call 911 immediately For transfer to the hospital emergency department!!  If you develop a fever greater that 101 F, purulent drainage from wound, increased redness or drainage from wound, or calf pain -- Call the office   Follow- Up Appointment: Please call for an appointment to be seen in 2 weeks  [facility] - [phone]    Post Anesthesia Home Care Instructions  Activity: Get plenty of rest for the remainder of the day. A responsible  adult should stay with you for 24 hours following the procedure.  For the next 24 hours, DO NOT: -Drive a car -Paediatric nurse -Drink alcoholic beverages -Take any medication unless instructed by your physician -Make any legal decisions or sign important papers.  Meals: Start with liquid foods such as gelatin or soup. Progress to regular foods as tolerated. Avoid greasy, spicy, heavy foods. If nausea and/or vomiting occur, drink only clear liquids until the nausea and/or  vomiting subsides. Call your physician if vomiting continues.  Special Instructions/Symptoms: Your throat may feel dry or sore from the anesthesia or the breathing tube placed in your throat during surgery. If this causes discomfort, gargle with warm salt water. The discomfort should disappear within 24 hours.   Regional Anesthesia Blocks  1. Numbness or the inability to move the "blocked" extremity may last from 3-48 hours after placement. The length of time depends on the medication injected and your individual response to the medication. If the numbness is not going away after 48 hours, call your surgeon.  2. The extremity that is blocked will need to be protected until the numbness is gone and the  Strength has returned. Because you cannot feel it, you will need to take extra care to avoid injury. Because it may be weak, you may have difficulty moving it or using it. You may not know what position it is in without looking at it while the block is in effect.  3. For blocks in the legs and feet, returning to weight bearing and walking needs to be done carefully. You will need to wait until the numbness is entirely gone and the strength has returned. You should be able to move your leg and foot normally before you try and bear weight or walk. You will need someone to be with you when you first try to ensure you do not fall and possibly risk injury.  4. Bruising and tenderness at the needle site are common side effects and will resolve in a few days.  5. Persistent numbness or new problems with movement should be communicated to the surgeon or the [facility] [phone] [place] [phone]).

## 2014-01-13 NOTE — Progress Notes (Signed)
Assisted Dr. Crews with right, ultrasound guided, popliteal block. Side rails up, monitors on throughout procedure. See vital signs in flow sheet. Tolerated Procedure well. 

## 2014-01-13 NOTE — Brief Op Note (Signed)
01/13/2014  9:43 AM  PATIENT:  Casey Cox  38 y.o. female  PRE-OPERATIVE DIAGNOSIS:  RIGHT ANKLE SPRAIN AND STRAIN OF ANKLE LIGAMENT  POST-OPERATIVE DIAGNOSIS:  RIGHT ANKLE SPRAIN AND STRAIN OF ANKLE LIGAMENT  PROCEDURE:  Procedure(s): RIGHT ANKLE REPAIR SECONDAY DISRUPTED LIGAMENT ANKLE  COLLATERAL WITH PERONEAL TENDON AUTOGRAFT (Right)  SURGEON:  Surgeon(s) and Role:    * W D Valeta Harms., MD - Primary  PHYSICIAN ASSISTANT: Chriss Czar, PA-C  ASSISTANTS:    ANESTHESIA:   local, regional and general  EBL:  Total I/O In: 1500 [I.V.:1500] Out: -   BLOOD ADMINISTERED:none  DRAINS: none   LOCAL MEDICATIONS USED:  MARCAINE     SPECIMEN:  No Specimen  DISPOSITION OF SPECIMEN:  N/A  COUNTS:  YES  TOURNIQUET:   Total Tourniquet Time Documented: Thigh (Right) - 99 minutes Total: Thigh (Right) - 99 minutes   DICTATION: .Other Dictation: Dictation Number unknown  PLAN OF CARE: Discharge to home after PACU  PATIENT DISPOSITION:  PACU - hemodynamically stable.   Delay start of Pharmacological VTE agent (>24hrs) due to surgical blood loss or risk of bleeding: not applicable

## 2014-01-13 NOTE — H&P (View-Only) (Signed)
Casey Cox is an 38 y.o. female.   Chief Complaint: left ankle ligament tear HPI: 38yo female seen originally with multiple joint complaints.  Described instability bilateral ankle left greater than right mri confirmed complete tear anterior talofibular ligament.    Past Medical History  Diagnosis Date  . Menorrhagia   . Patellofemoral syndrome   . Fibromyalgia   . Anemia     h/o anemia  . Anxiety   . 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 Missed abortions     x 2 - no surgery required  . Infertility     HX  . Migraine     "I have a few/month" (12/23/2013)  . Arthritis     "knees,hips, ankles, hands, jaw" (12/23/2013)  . 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 Chronic lower back pain     "recently" (12/23/2013)    Past Surgical History  Procedure Laterality Date  . Mandible fracture surgery  06/30/12    ARTHOCENTESIS OF THE JAW(tmj)  . Refractive surgery  12/2006  . Wisdom tooth extraction  ~ 2005  . Laparoscopy N/A 06/24/2013    Procedure: LAPAROSCOPY OPERATIVE WITH CHROMOPERTUBATION, LYSIS OF ADHESIONS AND APPENDECTOMY ;  Surgeon: Linda Hedges, DO;  Location: [facility];  Service: Gynecology;  Laterality: N/A;  . Appendectomy  06/24/2013  . Arthrotomy Bilateral 12/23/2013    Procedure: BILATERAL ARTHROTOMY, MENISECTOMY, TEMPORALIS FASCIA GRAFT;  Surgeon: Gae Bon, DDS;  Location: [facility];  Service: Oral Surgery;  Laterality: Bilateral;  . Eye surgery      Lasix both eyes    Family History  Problem Relation Age of Onset  . Fibromyalgia Mother   . Arthritis Mother   . Cancer Mother     SKIN CA  . Diabetes Father   . Arthritis Father   . Mental illness Maternal Grandmother   . Cancer Maternal Grandfather     PROSTATE CA  . Diabetes Maternal Grandfather   . Diabetes Paternal Grandmother   . Hyperlipidemia Paternal Grandmother   . Hypertension Paternal Grandmother   . Hypertension Paternal Grandfather   . Arthritis Paternal Grandfather   . Heart disease Paternal  Grandfather    Social History:  reports that she has never smoked. She has never used smokeless tobacco. She reports that she drinks about 1.2 ounces of alcohol per week. She reports that she does not use illicit drugs.  Allergies:  Allergies  Allergen Reactions  . Amoxicillin Rash  . Penicillins Rash  . Nickel Rash     (Not in a hospital admission)  No results found for this or any previous visit (from the past 48 hour(s)). No results found.  Review of Systems  Constitutional: Negative.   HENT: Positive for tinnitus. Negative for congestion, ear discharge, ear pain, hearing loss, nosebleeds and sore throat.   Eyes: Negative.   Respiratory: Negative.  Negative for stridor.   Cardiovascular: Negative.   Gastrointestinal: Negative.   Genitourinary: Positive for dysuria. Negative for urgency, frequency, hematuria and flank pain.  Musculoskeletal: Positive for falls and joint pain.  Skin: Negative.   Neurological: Positive for headaches. Negative for dizziness, tingling, tremors, sensory change, speech change, focal weakness, seizures and loss of consciousness.  Endo/Heme/Allergies: Negative.   Psychiatric/Behavioral: Negative.     Last menstrual period 12/04/2013. Physical Exam  Constitutional: She is oriented to person, place, and time. She appears well-developed and well-nourished. No distress.  HENT:  Head: Normocephalic and atraumatic.  Nose: Nose normal.  Eyes: Conjunctivae and EOM are normal. Pupils are equal, round, and reactive to light.  Neck: Normal range of motion. Neck supple.  Cardiovascular: Normal rate and intact distal pulses.   Respiratory: Effort normal. No respiratory distress. She has no wheezes.  GI: Soft. She exhibits no distension. There is no tenderness.  Musculoskeletal:       Left ankle: She exhibits swelling. Tenderness. AITFL tenderness found.  Neurological: She is alert and oriented to person, place, and time.  Skin: Skin is warm and dry. No  rash noted. No erythema.  Psychiatric: She has a normal mood and affect. Her behavior is normal.     Assessment/Plan Left ankle anterior talofibular ligament tear with instability  Discussed ligament repair with antograft peroneal tendon patient wishes to proceed with will be done outpatient.  General ansthesia ankle block.    Chriss Czar 01/12/2014, 5:21 PM

## 2014-01-13 NOTE — Transfer of Care (Signed)
Immediate Anesthesia Transfer of Care Note  Patient: Casey Cox  Procedure(s) Performed: Procedure(s): RIGHT ANKLE REPAIR SECONDAY DISRUPTED LIGAMENT ANKLE  COLLATERAL WITH PERONEAL TENDON AUTOGRAFT (Right)  Patient Location: PACU  Anesthesia Type:GA combined with regional for post-op pain  Level of Consciousness: sedated  Airway & Oxygen Therapy: Patient Spontanous Breathing and Patient connected to face mask oxygen  Post-op Assessment: Report given to PACU RN and Post -op Vital signs reviewed and stable  Post vital signs: Reviewed and stable  Complications: No apparent anesthesia complications

## 2014-01-13 NOTE — Anesthesia Postprocedure Evaluation (Signed)
  Anesthesia Post-op Note  Patient: Casey Cox  Procedure(s) Performed: Procedure(s): RIGHT ANKLE REPAIR SECONDAY DISRUPTED LIGAMENT ANKLE  COLLATERAL WITH PERONEAL TENDON AUTOGRAFT (Right)  Patient Location: PACU  Anesthesia Type: General, Regional   Level of Consciousness: awake, alert  and oriented  Airway and Oxygen Therapy: Patient Spontanous Breathing  Post-op Pain: mild  Post-op Assessment: Post-op Vital signs reviewed  Post-op Vital Signs: Reviewed  Last Vitals:  Filed Vitals:   01/13/14 1030  BP: 108/66  Pulse: 88  Temp:   Resp: 13    Complications: No apparent anesthesia complications

## 2014-01-13 NOTE — Anesthesia Preprocedure Evaluation (Addendum)
Anesthesia Evaluation  Patient identified by MRN, date of birth, ID band Patient awake    Reviewed: Allergy & Precautions, H&P , NPO status , Patient's Chart, lab work & pertinent test results  History of Anesthesia Complications Negative for: history of anesthetic complications  Airway       Dental   Pulmonary neg pulmonary ROS,          Cardiovascular negative cardio ROS      Neuro/Psych  Headaches, Anxiety Depression  Neuromuscular disease    GI/Hepatic   Endo/Other    Renal/GU      Musculoskeletal   Abdominal   Peds  Hematology   Anesthesia Other Findings   Reproductive/Obstetrics                           Anesthesia Physical Anesthesia Plan Anesthesia Quick Evaluation

## 2014-01-13 NOTE — Anesthesia Procedure Notes (Addendum)
Anesthesia Regional Block:  Popliteal block  Pre-Anesthetic Checklist: ,, timeout performed, Correct Patient, Correct Site, Correct Laterality, Correct Procedure, Correct Position, site marked, Risks and benefits discussed,  Surgical consent,  Pre-op evaluation,  At surgeon's request and post-op pain management  Laterality: Right and Lower  Prep: chloraprep       Needles:  Injection technique: Single-shot  Needle Type: Echogenic Needle     Needle Length: 9cm 9 cm Needle Gauge: 21 and 21 G    Additional Needles:  Procedures: ultrasound guided (picture in chart) Popliteal block Narrative:  Start time: 01/13/2014 7:10 AM End time: 01/13/2014 7:16 AM Injection made incrementally with aspirations every 5 mL.  Performed by: Personally  Anesthesiologist: Lorrene Reid, MD   Procedure Name: LMA Insertion Date/Time: 01/13/2014 7:39 AM Performed by: Maryella Shivers Pre-anesthesia Checklist: Patient identified, Emergency Drugs available, Suction available and Patient being monitored Patient Re-evaluated:Patient Re-evaluated prior to inductionOxygen Delivery Method: Circle System Utilized Preoxygenation: Pre-oxygenation with 100% oxygen Intubation Type: IV induction Ventilation: Mask ventilation without difficulty LMA: LMA inserted LMA Size: 4.0 Number of attempts: 1 Airway Equipment and Method: bite block Placement Confirmation: positive ETCO2 Tube secured with: Tape Dental Injury: Teeth and Oropharynx as per pre-operative assessment

## 2014-01-13 NOTE — Interval H&P Note (Signed)
History and Physical Interval Note:  01/13/2014 7:26 AM  Casey Cox  has presented today for surgery, with the diagnosis of RIGHT ANKLE SPRAIN AND STRAIN OF ANKLE LIGAMENT  The various methods of treatment have been discussed with the patient and family. After consideration of risks, benefits and other options for treatment, the patient has consented to  Procedure(s): [place] (Right) as a surgical intervention .  The patient's history has been reviewed, patient examined, no change in status, stable for surgery.  I have reviewed the patient's chart and labs.  Questions were answered to the patient's satisfaction.     Iverna Hammac JR,W D

## 2014-01-15 ENCOUNTER — Encounter (HOSPITAL_BASED_OUTPATIENT_CLINIC_OR_DEPARTMENT_OTHER): Payer: Self-pay | Admitting: Orthopedic Surgery

## 2014-01-15 NOTE — Op Note (Signed)
NAME:  Casey Cox, Casey Cox NO.:  [id number]  MEDICAL RECORD NO.:  [id number]  LOCATION:                                 FACILITY:  PHYSICIAN:  Lockie Pares, M.D.    DATE OF BIRTH:  Oct 20, 1975  DATE OF PROCEDURE:  01/13/2014 DATE OF DISCHARGE:  01/13/2014                              OPERATIVE REPORT   INDICATIONS:  A 38 year old with bilateral right greater than left ankle instability with MRI showed complete tear of the anterior talofibular ligament.  Recurrent episodes of instability.  Stress x-ray of the ankle showing 40 degrees of talar tilt with stress, thought to be amenable to outpatient surgery.  PREOPERATIVE DIAGNOSIS:  Anterolateral ankle instability, right ankle.  POSTOPERATIVE DIAGNOSIS:  Anterolateral ankle instability, right ankle.  OPERATION: 1. Modified [place] ankle reconstruction. 2. Split transfer peroneus brevis tendon augmentation for right ankle     reconstruction.  SURGEON:  Lockie Pares, M.D.  ASSISTANTBernadette Hoit, PA.  TOURNIQUET TIME:  1 hour 40 minutes.  DESCRIPTION OF PROCEDURE:  Supine position with a bump under the hip and bone foam to access to the lateral aspect of the ankle.  Gross instability was noted on physical exam.  She was approached through a curvilinear incision posterior to the fibula with dissection down towards the base of the 5th metatarsal.  Anterior flap was created.  We identified very attenuated scar tissue consistent with previous tear of the anterior fibular ligament, split this obliquely and later imbricated it.  Identified the peroneal tendon sheath, split it.  We did leave the upper part of the tendon sheath intact.  We dissected half peroneus brevis leaving it attached distally to the base of the 5th metatarsal. We created a soft tissue sleeve and went underneath the sural nerve branches with the tendon graft and drilled a hole about 1 inch, 2 cm above the tip of the fibula and enlarged this to  accept the tendon.  We then passed the tendon from anterior to posterior, sewed it back on itself to reconstruct the anterior talofibular ligament under some tension.  Relative to dorsiflex and eversion.  We sutured the edge of the tendon, which had gone posteriorly back on itself anteriorly.  We then imbricated the oblique cut in the anterior talofibular ligament remnant to imbricate this tissues.  Significant stability was restored to the ankle.  Wound was irrigated.  Some of the peroneal sheath had been split to identify the brevis tendon.  We repaired that with interrupted Vicryl followed by 2-0 Vicryl and 3-0 monofilament nylon on the skin.  Lightly compressive sterile dressing up posterior splint was tied with the ankle dorsiflexed and eversion.  Marcaine was infiltrated in the skin, 0.25%.  She had also had in addition to the general anesthetic, a preoperative popliteal block.  Taken to the recovery room in stable condition.     Lockie Pares, M.D.     WDC/MEDQ  D:  01/13/2014  T:  01/13/2014  Job:  [id number]

## 2014-01-22 ENCOUNTER — Telehealth: Payer: Self-pay | Admitting: Family Medicine

## 2014-01-22 NOTE — Telephone Encounter (Signed)
I do not prescribe xanax or other controlled substances in adults. Her rheumatologist started this and was prescribing this per her report. Per her report she did not feel they helped. I advise she see a psychiatrist if she feels this medication is needed. I referred her to psychiatry in April.

## 2014-01-22 NOTE — Telephone Encounter (Signed)
Pt would like a new referral for xanax rx for pt's anxierty.  They one she was previously sent to is only a Education officer, museum and does not rx xanax. Lattie Haw flores  recommends [facility] is in the same building if that helps.  [phone]

## 2014-01-22 NOTE — Telephone Encounter (Signed)
I called the pt and advised her of the message below per Dr Maudie Mercury.  She stated Trey Paula is a Education officer, museum and told her [facility] prescribes Xanax and due to her insurance the referral to [facility] D[facility] has to come from Dr Maudie Mercury.   I checked with Neoma Laming, our referral coordinator and she stated Trey Paula is a therapist and the pt does not have to have a referral from Dr Maudie Mercury as Lattie Haw can refer the pt to [facility] if needed and I left a detailed message with this on the pts cell number and to call Neoma Laming if she has any questions.

## 2014-01-22 NOTE — Telephone Encounter (Signed)
Pt needs new referral sent to Dr Dixie Dials @ [facility] for her ankle injury. Pt has [employer] and they only give 4 at a time.

## 2014-01-23 NOTE — Telephone Encounter (Signed)
done Authorization # [facility] [id number] [facility]  [facility]  Address: [address], [place], [place] [address]  [phone]

## 2014-01-26 ENCOUNTER — Ambulatory Visit: Payer: 59 | Admitting: Internal Medicine

## 2014-01-26 ENCOUNTER — Other Ambulatory Visit (INDEPENDENT_AMBULATORY_CARE_PROVIDER_SITE_OTHER): Payer: 59

## 2014-01-26 DIAGNOSIS — E349 Endocrine disorder, unspecified: Secondary | ICD-10-CM

## 2014-01-26 LAB — T3, FREE: T3 FREE: 3.2 pg/mL (ref 2.3–4.2)

## 2014-01-26 LAB — T4, FREE: Free T4: 0.84 ng/dL (ref 0.60–1.60)

## 2014-01-26 LAB — TSH: TSH: 0.11 u[IU]/mL — ABNORMAL LOW (ref 0.35–4.50)

## 2014-01-27 LAB — TESTOSTERONE, FREE, TOTAL, SHBG
Sex Hormone Binding: 60 nmol/L (ref 18–114)
TESTOSTERONE FREE: 5 pg/mL (ref 0.6–6.8)
TESTOSTERONE-% FREE: 1.2 % (ref 0.4–2.4)
TESTOSTERONE: 41 ng/dL (ref 10–70)

## 2014-01-28 ENCOUNTER — Other Ambulatory Visit: Payer: Self-pay | Admitting: Internal Medicine

## 2014-01-28 DIAGNOSIS — R7989 Other specified abnormal findings of blood chemistry: Secondary | ICD-10-CM

## 2014-01-30 ENCOUNTER — Telehealth: Payer: Self-pay | Admitting: Internal Medicine

## 2014-01-30 NOTE — Telephone Encounter (Signed)
No, without a dx of prediabetes, diabetes or PCOS I do not have clinical grounds to prescribe it, unfortunately.

## 2014-01-30 NOTE — Telephone Encounter (Signed)
Please read note below and advise.  

## 2014-01-30 NOTE — Telephone Encounter (Signed)
Called pt and advised her per Dr Arman Filter note. Pt frustrated and scheduled a follow up appt for next week.

## 2014-01-30 NOTE — Telephone Encounter (Signed)
Pt questioning if we could rx metformin for her due to not being able to lose weight. She wants to try it.

## 2014-02-03 ENCOUNTER — Encounter: Payer: Self-pay | Admitting: Internal Medicine

## 2014-02-03 ENCOUNTER — Ambulatory Visit (INDEPENDENT_AMBULATORY_CARE_PROVIDER_SITE_OTHER): Payer: 59 | Admitting: Internal Medicine

## 2014-02-03 VITALS — BP 110/68 | HR 114 | Temp 98.4°F | Resp 12 | Wt 187.0 lb

## 2014-02-03 DIAGNOSIS — E0590 Thyrotoxicosis, unspecified without thyrotoxic crisis or storm: Secondary | ICD-10-CM

## 2014-02-03 DIAGNOSIS — R635 Abnormal weight gain: Secondary | ICD-10-CM

## 2014-02-03 MED ORDER — DEXAMETHASONE 1 MG PO TABS: ORAL_TABLET | ORAL | Status: DC

## 2014-02-03 NOTE — Patient Instructions (Signed)
Please take the Dexamathasone 1 mg at 11 pm before coming for labs at 8 am. Please come for repeat thyroid labs in 2 months after the previous.  Please come back for a follow-up appointment in 4 months.

## 2014-02-03 NOTE — Progress Notes (Signed)
Patient ID: AYDIA MAJ, female   DOB: July 05, 1975, 38 y.o.   MRN: [id number]  HPI: Casey Cox is a 38 y.o. female, initially referred by Pt's PCP, Dr Maudie Mercury, in consultation for hormonal imbalance. At last visit, we checked her for PCOS and the labwork was negative. She is here to discuss weight gain and also for her newly dx'ed subclinical hyperthyroidism. ObGyn: Dr Lynnette Caffey.  She had a Mirena IUD placed 2 days ago.  She just had surgery for talo-fibular lig repair >> wears boot.  Low progesterone: Reviewed hx: I reviewed pt's chart and she has a h/o low Progesterone, but with ovulatory cycles. She is on Prometrium 200 mg daily which she took in the past and recently restarted. She has endometriosis and requested the endocrine referral for this. Dr Maudie Mercury advised her to see ObGyn for this problem, but pt insisted to see endocrinology for hormonal imbalance associated with endometriosis. She had laparoscopic sx for endometriosis and appendectomy recently.  She had painful ovulation >> started on OCP >> gained 10 lbs in 10 days, then restarted Progesterone back >> no more pain with ovulation, light periods, no dysmenorrhea.   Fertility/Menstrual cycles: - regular menses, lighter now on Progesterone - + h/o ovarian cysts on previous U/S's >> painful >> lasting <1 cycle - children: 0 - miscarriages: 2 - contraception: partner - LMP 11/06/2013  Acne: - started at 38 y/o - cystic - only on face - related to the menses - has appt with dermatologist - tried Doxycycline, ProActive, Spironolactone She stopped Kenalog cream and hydroquinone since last visit.  Hirsutism: - no   She has FH of endometriosis.   Weight gain: - from 145 to 187 - exercises a lot; gets at least 10,000 steps a day - she tried Phentermin and Qsymia >> did not work - saw dietitian - Meals:  - Breakfast: whole wheat toast and fruit - Lunch: salad or leftovers  - Dinner: protein source + veggies - Snacks:  1-3: fruit or veggies or crackers + humus  - Last HbA1c: Lab Results  Component Value Date   HGBA1C 5.4 11/14/2013   Subclinical hyperthyroidism: Pt has had mild hypothyroidism and she was started on Levothyroxine 25 mcg daily >> helped her daily fatigue greatly.  Latest thyroid tests: 08/2013: TSH was low reportedly 0.17 >> taken off the Levothyroxine then. 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 - depression - migraines  ROS: Constitutional: + weight gain, + fatigue, + subjective hyperthermia, + poor sleep Eyes: + blurry vision, no xerophthalmia ENT: no sore throat, no nodules palpated in throat, no dysphagia/odynophagia, no hoarseness, + tinnitus Cardiovascular: no CP/SOB/+ palpitations/+ leg swelling Respiratory: no cough/SOB Gastrointestinal: + N/+ V/no D/C Musculoskeletal: + all: muscle/joint aches Skin: + acne, no excess hair on face; + itching Neurological: + hand tremors/no numbness/tingling/dizziness, + HA  I reviewed pt's medications, allergies, PMH, social hx, family hx and no changes required, except as mentioned above.  PE: BP 110/68  Pulse 114  Temp(Src) 98.4 F (36.9  C) (Oral)  Resp 12  Wt 187 lb (84.823 kg)  SpO2 96% Body mass index is 33.13 kg/(m^2).  Wt Readings from Last 3 Encounters:  02/03/14 187 lb (84.823 kg)  01/13/14 182 lb 9.6 oz (82.827 kg)  01/13/14 182 lb 9.6 oz (82.827 kg)   Constitutional: overweight, in NAD, no full supraclavicular fat pads Eyes: PERRLA, EOMI, no exophthalmos ENT: moist mucous membranes, no thyromegaly, no cervical lymphadenopathy Cardiovascular: RRR, No MRG Respiratory:  CTA B Gastrointestinal: abdomen soft, NT, ND, BS+ Musculoskeletal: no deformities, strength intact in all 4 Skin: moist, warm; + acne on face, no skin tags, mild acanthosis nigricans, no purple, wide, stretch marks Neurological: no tremor with outstretched hands, DTR normal in all 4  ASSESSMENT: 1. Weight gain - No PCOS - see below  2. Subclinical hyperthyroidism Component     Latest Ref Rng 11/14/2013  Testosterone     10 - 70 ng/dL 29  Sex Hormone Binding     18 - 114 nmol/L 34  Testosterone Free     0.6 - 6.8 pg/mL 5.1  Testosterone-% Free     0.4 - 2.4 % 1.8  Estradiol, Free      0.81  Estradiol      37  Results received      11/21/13  TSH     0.35 - 4.50 uIU/mL 0.17 (L)  Hemoglobin A1C     4.6 - 6.5 % 5.4  Free T4     0.60 - 1.60 ng/dL 0.95  T3, Free     2.3 - 4.2 pg/mL 3.0  Prolactin      8.4  17-OH-Progesterone, LC/Casey/Casey      19  LH      9.85  FSH      5.1  Androstenedione      108   Component     Latest Ref Rng 01/26/2014  Testosterone     10 - 70 ng/dL 41  Sex Hormone Binding     18 - 114 nmol/L 60  Testosterone Free     0.6 - 6.8 pg/mL 5.0  Testosterone-% Free     0.4 - 2.4 % 1.2  TSH     0.35 - 4.50 uIU/mL 0.11 (L)  Free T4     0.60 - 1.60 ng/dL 0.84  T3, Free     2.3 - 4.2 pg/mL 3.2   PLAN: 1. Weight gain - pt very frustrated by her weight gain despite diet and exercise (not now after foot surgery, but previously) - we did not dx PCOS based on current results - no hypothyroidism - no pregnancy or menopause We discussed that the only thing left to check is Cushing sd. - we will check a dexametasone suppression test (DST) Orders Placed This Encounter  Procedures  . Cortisol   2. Subclinical hyperthyroidism - TSH <LLN x 2 >> will continue to follow for now. She has many sxs (unclear if related to the low TSH) and has a high pulse, but has had this before. - we will need another check in 2 mo after the last check. If these labs are  worse and the Cushing's sd. W/up is negative >> will need an uptake and scan  Cortisol 0.9 (negative DST).  Msg sent: Dear Casey Cox,  The cortisol test is <2, so it does not look like you have Cushing's syndrome. The thyroid test is the same as before >> let's continue to watch it and repeat the test when you come back  in 4 months.  Sincerely,  Philemon Kingdom MD

## 2014-02-13 ENCOUNTER — Other Ambulatory Visit (INDEPENDENT_AMBULATORY_CARE_PROVIDER_SITE_OTHER): Payer: 59

## 2014-02-13 DIAGNOSIS — R635 Abnormal weight gain: Secondary | ICD-10-CM

## 2014-02-13 DIAGNOSIS — R7989 Other specified abnormal findings of blood chemistry: Secondary | ICD-10-CM

## 2014-02-13 DIAGNOSIS — R946 Abnormal results of thyroid function studies: Secondary | ICD-10-CM

## 2014-02-13 LAB — CORTISOL: CORTISOL PLASMA: 0.9 ug/dL

## 2014-02-13 LAB — T4, FREE: Free T4: 0.96 ng/dL (ref 0.60–1.60)

## 2014-02-13 LAB — TSH: TSH: 0.11 u[IU]/mL — AB (ref 0.35–4.50)

## 2014-02-13 LAB — T3, FREE: T3 FREE: 3.6 pg/mL (ref 2.3–4.2)

## 2014-03-30 ENCOUNTER — Telehealth: Payer: Self-pay | Admitting: Family Medicine

## 2014-03-30 ENCOUNTER — Encounter: Payer: Self-pay | Admitting: Internal Medicine

## 2014-03-30 DIAGNOSIS — M797 Fibromyalgia: Secondary | ICD-10-CM

## 2014-03-30 NOTE — Telephone Encounter (Signed)
PT NEEDS REFERRAL TO  Dr Hurley Cisco.  Rheumatologist  Pt has appt Wed Nov 4 at 3:30 pm.  Pt has seen the dr before, but not this calendar year. Needs referral for fibromyalgia  Pt's previous referral was not in her network.

## 2014-03-30 NOTE — Telephone Encounter (Signed)
Ok to place referral.

## 2014-03-31 ENCOUNTER — Other Ambulatory Visit: Payer: 59

## 2014-03-31 NOTE — Telephone Encounter (Signed)
Order for referral placed.

## 2014-04-03 ENCOUNTER — Other Ambulatory Visit: Payer: Self-pay | Admitting: Internal Medicine

## 2014-04-03 ENCOUNTER — Other Ambulatory Visit (INDEPENDENT_AMBULATORY_CARE_PROVIDER_SITE_OTHER): Payer: 59

## 2014-04-03 DIAGNOSIS — E0590 Thyrotoxicosis, unspecified without thyrotoxic crisis or storm: Secondary | ICD-10-CM

## 2014-04-03 LAB — TSH: TSH: 0.26 u[IU]/mL — ABNORMAL LOW (ref 0.35–4.50)

## 2014-04-03 LAB — T3, FREE: T3, Free: 3.8 pg/mL (ref 2.3–4.2)

## 2014-04-03 LAB — T4, FREE: Free T4: 0.93 ng/dL (ref 0.60–1.60)

## 2014-04-06 ENCOUNTER — Encounter (HOSPITAL_BASED_OUTPATIENT_CLINIC_OR_DEPARTMENT_OTHER): Payer: Self-pay | Admitting: *Deleted

## 2014-04-07 ENCOUNTER — Ambulatory Visit: Payer: Self-pay | Admitting: Physician Assistant

## 2014-04-07 ENCOUNTER — Telehealth: Payer: Self-pay | Admitting: Family Medicine

## 2014-04-07 NOTE — Telephone Encounter (Signed)
Pt called back and stated she called [facility] herself and spoke with sarah she was told that our office can not submit the referral it would have to come from [place] because of the sessions and how long she will need them , so that's why  Our office could not do authorization.

## 2014-04-07 NOTE — H&P (Signed)
Casey Cox is an 38 y.o. female.   Chief Complaint: left ankle instability  HPI: 38yo female known to our office history or bilateral ankle instability she underwent reconstruction of the more symptomatic right ankle earlier this year with good result and is wanting to proceed with the other ankle at this point.  MRI does not show a tear but does have a very loose ankle on exam with recurrent instability.   Past Medical History  Diagnosis Date  . Fibromyalgia   . Anxiety   . Migraine   . TMJ syndrome   . Osteoarthritis 05/2012    jaw, left ankle, knees  . Interstitial cystitis   . Tibiofibular ligament sprain, distal 03/2014    left ankle  . Acne   . History of melasma   . Asymptomatic PVCs     Past Surgical History  Procedure Laterality Date  . Mandible fracture surgery  06/30/12    ARTHOCENTESIS OF THE JAW(tmj)  . Wisdom tooth extraction    . Laparoscopy N/A 06/24/2013    Procedure: LAPAROSCOPY OPERATIVE WITH CHROMOPERTUBATION, LYSIS OF ADHESIONS AND APPENDECTOMY ;  Surgeon: Linda Hedges, DO;  Location: [facility];  Service: Gynecology;  Laterality: N/A;  . Appendectomy  06/24/2013  . Arthrotomy Bilateral 12/23/2013    Procedure: BILATERAL ARTHROTOMY, MENISECTOMY, TEMPORALIS FASCIA GRAFT;  Surgeon: Gae Bon, DDS;  Location: [facility];  Service: Oral Surgery;  Laterality: Bilateral;  . Ligament repair Right 01/13/2014    Procedure: RIGHT ANKLE REPAIR SECONDAY DISRUPTED LIGAMENT ANKLE  COLLATERAL WITH PERONEAL TENDON AUTOGRAFT;  Surgeon: Yvette Rack., MD;  Location: [facility];  Service: Orthopedics;  Laterality: Right;  . Lasik Bilateral     Family History  Problem Relation Age of Onset  . Fibromyalgia Mother   . Arthritis Mother   . Cancer Mother     SKIN CA  . Diabetes Father   . Arthritis Father   . Mental illness Maternal Grandmother   . Cancer Maternal Grandfather     PROSTATE CA  . Diabetes Maternal Grandfather   . Diabetes Paternal Grandmother    . Hyperlipidemia Paternal Grandmother   . Hypertension Paternal Grandmother   . Hypertension Paternal Grandfather   . Arthritis Paternal Grandfather   . Heart disease Paternal Grandfather    Social History:  reports that she has never smoked. She has never used smokeless tobacco. She reports that she drinks alcohol. She reports that she does not use illicit drugs.  Allergies:  Allergies  Allergen Reactions  . Nickel Rash  . Penicillins Rash     (Not in a hospital admission)  No results found for this or any previous visit (from the past 48 hour(s)). No results found.  Review of Systems  Constitutional: Negative.   HENT: Positive for tinnitus. Negative for congestion, ear discharge, ear pain, hearing loss, nosebleeds and sore throat.   Eyes: Negative.   Respiratory: Negative.  Negative for stridor.   Cardiovascular: Negative.   Gastrointestinal: Negative.   Genitourinary: Positive for dysuria.  Musculoskeletal: Positive for joint pain.  Skin: Negative.   Neurological: Positive for headaches. Negative for dizziness, tingling, tremors, sensory change, speech change, focal weakness, seizures and loss of consciousness.  Endo/Heme/Allergies: Negative.   Psychiatric/Behavioral: Negative.     Last menstrual period 03/25/2014. Physical Exam  Constitutional: She is oriented to person, place, and time. She appears well-developed and well-nourished. No distress.  HENT:  Head: Normocephalic and atraumatic.  Nose: Nose normal.  Eyes: Conjunctivae and  EOM are normal. Pupils are equal, round, and reactive to light.  Neck: Normal range of motion. Neck supple.  Cardiovascular: Normal rate and intact distal pulses.   Respiratory: Effort normal. No respiratory distress. She has no wheezes.  GI: Soft. She exhibits no distension. There is no tenderness.  Musculoskeletal:       Left ankle: Tenderness.  Increased ligamentous laxity laterally  Lymphadenopathy:    She has no cervical  adenopathy.  Neurological: She is alert and oriented to person, place, and time. No cranial nerve deficit.  Skin: Skin is warm and dry. No rash noted. No erythema.  Psychiatric: She has a normal mood and affect. Her behavior is normal.     Assessment/Plan Left ankle instability   Discussed ligament repair with antograft peroneal tendon patient wishes to proceed with will be done outpatient. General ansthesia.   Chriss Czar 04/07/2014, 10:55 AM

## 2014-04-07 NOTE — Telephone Encounter (Signed)
Called [facility] because I tried to submit  A compass referral on line for this pt  To see [place] [place] [address], [place] [address]  Phone: [phone] Fax: [phone] [facility] Could not find this Dr ,called [employer] for assistance  contact Provider Services at [phone] to help they could not locate provider in the system either , called the patient back to inform her that we were having trouble finding this provider in the [facility] comapss , called the office to get more information but went to [place] be able to do referral , called pt  She states she will call healthcare system to see what she can do to help with this .

## 2014-04-07 NOTE — Telephone Encounter (Signed)
Pt states insurance([employer]) requires a referral to be submitted to them in order for her to see Leary Roca (therapist) today at 2:30.  Please submit referral for patient as requested.

## 2014-04-08 ENCOUNTER — Ambulatory Visit (HOSPITAL_BASED_OUTPATIENT_CLINIC_OR_DEPARTMENT_OTHER): Payer: 59 | Admitting: Certified Registered"

## 2014-04-08 ENCOUNTER — Encounter (HOSPITAL_BASED_OUTPATIENT_CLINIC_OR_DEPARTMENT_OTHER): Payer: Self-pay | Admitting: *Deleted

## 2014-04-08 ENCOUNTER — Encounter (HOSPITAL_BASED_OUTPATIENT_CLINIC_OR_DEPARTMENT_OTHER)
Admission: RE | Disposition: A | Discharge status: Home / Self Care | Payer: Self-pay | Source: Ambulatory Visit | Attending: Orthopedic Surgery

## 2014-04-08 ENCOUNTER — Ambulatory Visit (HOSPITAL_BASED_OUTPATIENT_CLINIC_OR_DEPARTMENT_OTHER)
Admission: RE | Admit: 2014-04-08 | Discharge: 2014-04-08 | Disposition: A | Discharge status: Home / Self Care | Payer: 59 | Source: Ambulatory Visit | Attending: Orthopedic Surgery | Admitting: Orthopedic Surgery

## 2014-04-08 DIAGNOSIS — M25372 Other instability, left ankle: Secondary | ICD-10-CM | POA: Insufficient documentation

## 2014-04-08 DIAGNOSIS — F419 Anxiety disorder, unspecified: Secondary | ICD-10-CM | POA: Insufficient documentation

## 2014-04-08 DIAGNOSIS — Z880 Allergy status to penicillin: Secondary | ICD-10-CM | POA: Diagnosis not present

## 2014-04-08 DIAGNOSIS — M797 Fibromyalgia: Secondary | ICD-10-CM | POA: Diagnosis not present

## 2014-04-08 HISTORY — DX: Personal history of diseases of the skin and subcutaneous tissue: Z87.2

## 2014-04-08 HISTORY — PX: ANKLE RECONSTRUCTION: SHX1151

## 2014-04-08 HISTORY — DX: Acne, unspecified: L70.9

## 2014-04-08 HISTORY — DX: Interstitial cystitis (chronic) without hematuria: N30.10

## 2014-04-08 HISTORY — DX: Arthralgia of temporomandibular joint, unspecified side: M26.629

## 2014-04-08 LAB — POCT HEMOGLOBIN-HEMACUE: Hemoglobin: 13.7 g/dL (ref 12.0–15.0)

## 2014-04-08 SURGERY — RECONSTRUCTION, ANKLE
Anesthesia: General | Site: Ankle | Laterality: Left

## 2014-04-08 MED ORDER — LACTATED RINGERS IV SOLN
INTRAVENOUS | Status: DC
  Administered 2014-04-08 (×2): via INTRAVENOUS

## 2014-04-08 MED ORDER — PROPOFOL 10 MG/ML IV BOLUS
INTRAVENOUS | Status: DC | PRN
  Administered 2014-04-08: 200 mg via INTRAVENOUS

## 2014-04-08 MED ORDER — MIDAZOLAM HCL 2 MG/2ML IJ SOLN
INTRAMUSCULAR | Status: AC
  Filled 2014-04-08: qty 2

## 2014-04-08 MED ORDER — FENTANYL CITRATE 0.05 MG/ML IJ SOLN
50.0000 ug | INTRAMUSCULAR | Status: DC | PRN
  Administered 2014-04-08: 100 ug via INTRAVENOUS

## 2014-04-08 MED ORDER — CLINDAMYCIN PHOSPHATE 900 MG/50ML IV SOLN
900.0000 mg | INTRAVENOUS | Status: AC
Start: 2014-04-08 — End: 2014-04-08
  Administered 2014-04-08: 900 mg via INTRAVENOUS

## 2014-04-08 MED ORDER — PROMETHAZINE HCL 12.5 MG PO TABS: 12.5000 mg | ORAL_TABLET | Freq: Four times a day (QID) | ORAL | Status: DC | PRN

## 2014-04-08 MED ORDER — OXYCODONE HCL 5 MG PO TABS: 5.0000 mg | ORAL_TABLET | Freq: Once | ORAL | Status: DC | PRN

## 2014-04-08 MED ORDER — FENTANYL CITRATE 0.05 MG/ML IJ SOLN
INTRAMUSCULAR | Status: AC
  Filled 2014-04-08: qty 2

## 2014-04-08 MED ORDER — DEXAMETHASONE SODIUM PHOSPHATE 4 MG/ML IJ SOLN
INTRAMUSCULAR | Status: DC | PRN
  Administered 2014-04-08: 10 mg via INTRAVENOUS

## 2014-04-08 MED ORDER — ONDANSETRON HCL 4 MG/2ML IJ SOLN
INTRAMUSCULAR | Status: DC | PRN
  Administered 2014-04-08: 4 mg via INTRAVENOUS

## 2014-04-08 MED ORDER — SUFENTANIL CITRATE 50 MCG/ML IV SOLN
INTRAVENOUS | Status: AC
  Filled 2014-04-08: qty 1

## 2014-04-08 MED ORDER — MIDAZOLAM HCL 2 MG/2ML IJ SOLN
1.0000 mg | INTRAMUSCULAR | Status: DC | PRN
Start: 2014-04-08 — End: 2014-04-08
  Administered 2014-04-08: 2 mg via INTRAVENOUS

## 2014-04-08 MED ORDER — SUFENTANIL CITRATE 50 MCG/ML IV SOLN
INTRAVENOUS | Status: DC | PRN
  Administered 2014-04-08 (×3): 5 ug via INTRAVENOUS

## 2014-04-08 MED ORDER — OXYCODONE HCL 5 MG/5ML PO SOLN
5.0000 mg | Freq: Once | ORAL | Status: DC | PRN
Start: 2014-04-08 — End: 2014-04-08

## 2014-04-08 MED ORDER — 0.9 % SODIUM CHLORIDE (POUR BTL) OPTIME
TOPICAL | Status: DC | PRN
  Administered 2014-04-08: 400 mL

## 2014-04-08 MED ORDER — CHLORHEXIDINE GLUCONATE 4 % EX LIQD: 60.0000 mL | Freq: Once | CUTANEOUS | Status: DC

## 2014-04-08 MED ORDER — PROMETHAZINE HCL 25 MG/ML IJ SOLN
6.2500 mg | INTRAMUSCULAR | Status: DC | PRN
  Administered 2014-04-08: 12.5 mg via INTRAVENOUS

## 2014-04-08 MED ORDER — MEPERIDINE HCL 25 MG/ML IJ SOLN: 6.2500 mg | INTRAMUSCULAR | Status: DC | PRN

## 2014-04-08 MED ORDER — BUPIVACAINE-EPINEPHRINE (PF) 0.5% -1:200000 IJ SOLN
INTRAMUSCULAR | Status: DC | PRN
  Administered 2014-04-08: 30 mL via PERINEURAL

## 2014-04-08 MED ORDER — HYDROMORPHONE HCL 1 MG/ML IJ SOLN: 0.2500 mg | INTRAMUSCULAR | Status: DC | PRN

## 2014-04-08 MED ORDER — SCOPOLAMINE 1 MG/3DAYS TD PT72
MEDICATED_PATCH | TRANSDERMAL | Status: AC
Start: 2014-04-08 — End: 2014-04-08
  Filled 2014-04-08: qty 1

## 2014-04-08 MED ORDER — SUCCINYLCHOLINE CHLORIDE 20 MG/ML IJ SOLN
INTRAMUSCULAR | Status: AC
  Filled 2014-04-08: qty 1

## 2014-04-08 MED ORDER — PROMETHAZINE HCL 25 MG/ML IJ SOLN
INTRAMUSCULAR | Status: AC
  Filled 2014-04-08: qty 1

## 2014-04-08 MED ORDER — MIDAZOLAM HCL 2 MG/2ML IJ SOLN: 0.5000 mg | Freq: Once | INTRAMUSCULAR | Status: DC | PRN

## 2014-04-08 MED ORDER — SCOPOLAMINE 1 MG/3DAYS TD PT72
MEDICATED_PATCH | TRANSDERMAL | Status: DC | PRN
  Administered 2014-04-08: 1 via TRANSDERMAL

## 2014-04-08 MED ORDER — OXYCODONE-ACETAMINOPHEN 5-325 MG PO TABS: 1.0000 | ORAL_TABLET | ORAL | Status: DC | PRN

## 2014-04-08 MED ORDER — PROPOFOL 10 MG/ML IV EMUL
INTRAVENOUS | Status: AC
  Filled 2014-04-08: qty 50

## 2014-04-08 MED ORDER — MIDAZOLAM HCL 5 MG/5ML IJ SOLN
INTRAMUSCULAR | Status: DC | PRN
  Administered 2014-04-08: 2 mg via INTRAVENOUS

## 2014-04-08 MED ORDER — CLINDAMYCIN PHOSPHATE 900 MG/50ML IV SOLN
INTRAVENOUS | Status: AC
  Filled 2014-04-08: qty 50

## 2014-04-08 MED ORDER — BUPIVACAINE HCL (PF) 0.5 % IJ SOLN
INTRAMUSCULAR | Status: DC | PRN
  Administered 2014-04-08: 10 mL

## 2014-04-08 SURGICAL SUPPLY — 86 items
BANDAGE ELASTIC 4 VELCRO ST LF (GAUZE/BANDAGES/DRESSINGS) ×2 IMPLANT
BANDAGE ELASTIC 6 VELCRO ST LF (GAUZE/BANDAGES/DRESSINGS) ×2 IMPLANT
BANDAGE ESMARK 6X9 LF (GAUZE/BANDAGES/DRESSINGS) ×1 IMPLANT
BLADE SURG 15 STRL LF DISP TIS (BLADE) ×1 IMPLANT
BLADE SURG 15 STRL SS (BLADE) ×1
BNDG ESMARK 4X9 LF (GAUZE/BANDAGES/DRESSINGS) IMPLANT
BNDG ESMARK 6X9 LF (GAUZE/BANDAGES/DRESSINGS) ×2
BNDG GAUZE ELAST 4 BULKY (GAUZE/BANDAGES/DRESSINGS) IMPLANT
CANISTER SUCT 1200ML W/VALVE (MISCELLANEOUS) IMPLANT
CORDS BIPOLAR (ELECTRODE) ×2 IMPLANT
COVER BACK TABLE 60X90IN (DRAPES) ×2 IMPLANT
COVER SURGICAL LIGHT HANDLE (MISCELLANEOUS) ×2 IMPLANT
DECANTER SPIKE VIAL GLASS SM (MISCELLANEOUS) IMPLANT
DEPRESSOR TONGUE BLADE STERILE (MISCELLANEOUS) ×2 IMPLANT
DRAPE EXTREMITY T 121X128X90 (DRAPE) ×2 IMPLANT
DRAPE OEC MINIVIEW 54X84 (DRAPES) ×2 IMPLANT
DRAPE U 20/CS (DRAPES) ×2 IMPLANT
DRAPE U-SHAPE 47X51 STRL (DRAPES) ×2 IMPLANT
DRSG EMULSION OIL 3X3 NADH (GAUZE/BANDAGES/DRESSINGS) ×2 IMPLANT
DRSG PAD ABDOMINAL 8X10 ST (GAUZE/BANDAGES/DRESSINGS) ×2 IMPLANT
DURAPREP 26ML APPLICATOR (WOUND CARE) ×2 IMPLANT
ELECT REM PT RETURN 9FT ADLT (ELECTROSURGICAL) ×2
ELECTRODE REM PT RTRN 9FT ADLT (ELECTROSURGICAL) ×1 IMPLANT
GAUZE SPONGE 4X4 12PLY STRL (GAUZE/BANDAGES/DRESSINGS) ×2 IMPLANT
GLOVE BIO SURGEON STRL SZ7.5 (GLOVE) ×2 IMPLANT
GLOVE BIOGEL PI IND STRL 7.0 (GLOVE) ×2 IMPLANT
GLOVE BIOGEL PI IND STRL 8 (GLOVE) ×3 IMPLANT
GLOVE BIOGEL PI IND STRL 8.5 (GLOVE) IMPLANT
GLOVE BIOGEL PI INDICATOR 7.0 (GLOVE) ×2
GLOVE BIOGEL PI INDICATOR 8 (GLOVE) ×3
GLOVE BIOGEL PI INDICATOR 8.5 (GLOVE)
GLOVE ECLIPSE 6.5 STRL STRAW (GLOVE) ×2 IMPLANT
GLOVE ECLIPSE 7.5 STRL STRAW (GLOVE) IMPLANT
GLOVE SURG ORTHO 8.0 STRL STRW (GLOVE) ×2 IMPLANT
GOWN STRL REUS W/ TWL LRG LVL3 (GOWN DISPOSABLE) ×1 IMPLANT
GOWN STRL REUS W/ TWL XL LVL3 (GOWN DISPOSABLE) ×2 IMPLANT
GOWN STRL REUS W/TWL LRG LVL3 (GOWN DISPOSABLE) ×1
GOWN STRL REUS W/TWL XL LVL3 (GOWN DISPOSABLE) ×2
KIT BIO-TENODESIS 3X8 DISP (MISCELLANEOUS)
KIT INSRT BABSR STRL DISP BTN (MISCELLANEOUS) IMPLANT
NEEDLE FISTULA 1/2 CIRCLE (NEEDLE) ×2 IMPLANT
NEEDLE HYPO 25X1 1.5 SAFETY (NEEDLE) IMPLANT
NS IRRIG 1000ML POUR BTL (IV SOLUTION) ×2 IMPLANT
PACK BASIN DAY SURGERY FS (CUSTOM PROCEDURE TRAY) ×2 IMPLANT
PAD CAST 4YDX4 CTTN HI CHSV (CAST SUPPLIES) ×1 IMPLANT
PADDING CAST ABS 3INX4YD NS (CAST SUPPLIES)
PADDING CAST ABS 4INX4YD NS (CAST SUPPLIES) ×1
PADDING CAST ABS COTTON 3X4 (CAST SUPPLIES) IMPLANT
PADDING CAST ABS COTTON 4X4 ST (CAST SUPPLIES) ×1 IMPLANT
PADDING CAST COTTON 4X4 STRL (CAST SUPPLIES) ×1
PADDING CAST COTTON 6X4 STRL (CAST SUPPLIES) ×2 IMPLANT
PASSER SUT SWANSON 36MM LOOP (INSTRUMENTS) IMPLANT
PENCIL BUTTON HOLSTER BLD 10FT (ELECTRODE) ×2 IMPLANT
RETRIEVER SUT HEWSON (MISCELLANEOUS) IMPLANT
SHEET MEDIUM DRAPE 40X70 STRL (DRAPES) IMPLANT
SLEEVE SCD COMPRESS KNEE MED (MISCELLANEOUS) ×2 IMPLANT
SPLINT FAST PLASTER 5X30 (CAST SUPPLIES) ×30
SPLINT PLASTER CAST FAST 5X30 (CAST SUPPLIES) ×30 IMPLANT
SPONGE LAP 4X18 X RAY DECT (DISPOSABLE) ×2 IMPLANT
STAPLER VISISTAT 35W (STAPLE) IMPLANT
STOCKINETTE 6  STRL (DRAPES) ×1
STOCKINETTE 6 STRL (DRAPES) ×1 IMPLANT
SUCTION FRAZIER TIP 10 FR DISP (SUCTIONS) ×2 IMPLANT
SUT 2 FIBERLOOP 20 STRT BLUE (SUTURE)
SUT ETHILON 3 0 PS 1 (SUTURE) ×2 IMPLANT
SUT ETHILON 4 0 PS 2 18 (SUTURE) IMPLANT
SUT FIBERWIRE #2 38 T-5 BLUE (SUTURE)
SUT FIBERWIRE 2-0 18 17.9 3/8 (SUTURE) ×4
SUT VIC AB 0 CT1 27 (SUTURE) ×1
SUT VIC AB 0 CT1 27XBRD ANBCTR (SUTURE) ×1 IMPLANT
SUT VIC AB 2-0 CT1 27 (SUTURE)
SUT VIC AB 2-0 CT1 TAPERPNT 27 (SUTURE) IMPLANT
SUT VIC AB 2-0 PS2 27 (SUTURE) IMPLANT
SUT VIC AB 2-0 SH 27 (SUTURE) ×1
SUT VIC AB 2-0 SH 27XBRD (SUTURE) ×1 IMPLANT
SUT VIC AB 3-0 SH 27 (SUTURE)
SUT VIC AB 3-0 SH 27X BRD (SUTURE) IMPLANT
SUTURE 2 FIBERLOOP 20 STRT BLU (SUTURE) IMPLANT
SUTURE FIBERWR #2 38 T-5 BLUE (SUTURE) IMPLANT
SUTURE FIBERWR 2-0 18 17.9 3/8 (SUTURE) ×2 IMPLANT
SYR BULB 3OZ (MISCELLANEOUS) ×2 IMPLANT
SYR CONTROL 10ML LL (SYRINGE) IMPLANT
TOWEL OR 17X24 6PK STRL BLUE (TOWEL DISPOSABLE) ×4 IMPLANT
TUBE CONNECTING 20X1/4 (TUBING) ×2 IMPLANT
UNDERPAD 30X30 INCONTINENT (UNDERPADS AND DIAPERS) ×2 IMPLANT
YANKAUER SUCT BULB TIP NO VENT (SUCTIONS) IMPLANT

## 2014-04-08 NOTE — H&P (View-Only) (Signed)
Casey Cox is an 38 y.o. female.   Chief Complaint: left ankle instability  HPI: 38yo female known to our office history or bilateral ankle instability she underwent reconstruction of the more symptomatic right ankle earlier this year with good result and is wanting to proceed with the other ankle at this point.  MRI does not show a tear but does have a very loose ankle on exam with recurrent instability.   Past Medical History  Diagnosis Date  . Fibromyalgia   . Anxiety   . Migraine   . TMJ syndrome   . Osteoarthritis 05/2012    jaw, left ankle, knees  . Interstitial cystitis   . Tibiofibular ligament sprain, distal 03/2014    left ankle  . Acne   . History of melasma   . Asymptomatic PVCs     Past Surgical History  Procedure Laterality Date  . Mandible fracture surgery  06/30/12    ARTHOCENTESIS OF THE JAW(tmj)  . Wisdom tooth extraction    . Laparoscopy N/A 06/24/2013    Procedure: LAPAROSCOPY OPERATIVE WITH CHROMOPERTUBATION, LYSIS OF ADHESIONS AND APPENDECTOMY ;  Surgeon: Linda Hedges, DO;  Location: [facility];  Service: Gynecology;  Laterality: N/A;  . Appendectomy  06/24/2013  . Arthrotomy Bilateral 12/23/2013    Procedure: BILATERAL ARTHROTOMY, MENISECTOMY, TEMPORALIS FASCIA GRAFT;  Surgeon: Gae Bon, DDS;  Location: [facility];  Service: Oral Surgery;  Laterality: Bilateral;  . Ligament repair Right 01/13/2014    Procedure: RIGHT ANKLE REPAIR SECONDAY DISRUPTED LIGAMENT ANKLE  COLLATERAL WITH PERONEAL TENDON AUTOGRAFT;  Surgeon: Yvette Rack., MD;  Location: [facility];  Service: Orthopedics;  Laterality: Right;  . Lasik Bilateral     Family History  Problem Relation Age of Onset  . Fibromyalgia Mother   . Arthritis Mother   . Cancer Mother     SKIN CA  . Diabetes Father   . Arthritis Father   . Mental illness Maternal Grandmother   . Cancer Maternal Grandfather     PROSTATE CA  . Diabetes Maternal Grandfather   . Diabetes Paternal Grandmother    . Hyperlipidemia Paternal Grandmother   . Hypertension Paternal Grandmother   . Hypertension Paternal Grandfather   . Arthritis Paternal Grandfather   . Heart disease Paternal Grandfather    Social History:  reports that she has never smoked. She has never used smokeless tobacco. She reports that she drinks alcohol. She reports that she does not use illicit drugs.  Allergies:  Allergies  Allergen Reactions  . Nickel Rash  . Penicillins Rash     (Not in a hospital admission)  No results found for this or any previous visit (from the past 48 hour(s)). No results found.  Review of Systems  Constitutional: Negative.   HENT: Positive for tinnitus. Negative for congestion, ear discharge, ear pain, hearing loss, nosebleeds and sore throat.   Eyes: Negative.   Respiratory: Negative.  Negative for stridor.   Cardiovascular: Negative.   Gastrointestinal: Negative.   Genitourinary: Positive for dysuria.  Musculoskeletal: Positive for joint pain.  Skin: Negative.   Neurological: Positive for headaches. Negative for dizziness, tingling, tremors, sensory change, speech change, focal weakness, seizures and loss of consciousness.  Endo/Heme/Allergies: Negative.   Psychiatric/Behavioral: Negative.     Last menstrual period 03/25/2014. Physical Exam  Constitutional: She is oriented to person, place, and time. She appears well-developed and well-nourished. No distress.  HENT:  Head: Normocephalic and atraumatic.  Nose: Nose normal.  Eyes: Conjunctivae and  EOM are normal. Pupils are equal, round, and reactive to light.  Neck: Normal range of motion. Neck supple.  Cardiovascular: Normal rate and intact distal pulses.   Respiratory: Effort normal. No respiratory distress. She has no wheezes.  GI: Soft. She exhibits no distension. There is no tenderness.  Musculoskeletal:       Left ankle: Tenderness.  Increased ligamentous laxity laterally  Lymphadenopathy:    She has no cervical  adenopathy.  Neurological: She is alert and oriented to person, place, and time. No cranial nerve deficit.  Skin: Skin is warm and dry. No rash noted. No erythema.  Psychiatric: She has a normal mood and affect. Her behavior is normal.     Assessment/Plan Left ankle instability   Discussed ligament repair with antograft peroneal tendon patient wishes to proceed with will be done outpatient. General ansthesia.   Chriss Czar 04/07/2014, 10:55 AM

## 2014-04-08 NOTE — Anesthesia Preprocedure Evaluation (Signed)
Anesthesia Evaluation  Patient identified by MRN, date of birth, ID band Patient awake    Reviewed: Allergy & Precautions, H&P , NPO status , Patient's Chart, lab work & pertinent test results  History of Anesthesia Complications Negative for: history of anesthetic complications  Airway Mallampati: II  TM Distance: >3 FB Neck ROM: Full    Dental  (+) Teeth Intact, Dental Advisory Given   Pulmonary neg pulmonary ROS,  breath sounds clear to auscultation        Cardiovascular negative cardio ROS  Rhythm:Regular Rate:Normal     Neuro/Psych  Headaches,    GI/Hepatic negative GI ROS, Neg liver ROS,   Endo/Other  Morbid obesity  Renal/GU negative Renal ROS     Musculoskeletal  (+) Arthritis -, Osteoarthritis,    Abdominal (+) + obese,   Peds  Hematology   Anesthesia Other Findings   Reproductive/Obstetrics LMP 2 weeks ago, has IUD                             Anesthesia Physical Anesthesia Plan  ASA: I  Anesthesia Plan: General   Post-op Pain Management:    Induction: Intravenous  Airway Management Planned: LMA  Additional Equipment:   Intra-op Plan:   Post-operative Plan:   Informed Consent: I have reviewed the patients History and Physical, chart, labs and discussed the procedure including the risks, benefits and alternatives for the proposed anesthesia with the patient or authorized representative who has indicated his/her understanding and acceptance.   Dental advisory given  Plan Discussed with: CRNA and Surgeon  Anesthesia Plan Comments: (Plan routine monitors, GA- LMA OK, popliteal block for post op analgesia)        Anesthesia Quick Evaluation

## 2014-04-08 NOTE — Interval H&P Note (Signed)
History and Physical Interval Note:  04/08/2014 8:35 AM  Casey Cox  has presented today for surgery, with the diagnosis of sprain of tibiofibular ligament of unspecified ankle, subsequent encounter  The various methods of treatment have been discussed with the patient and family. After consideration of risks, benefits and other options for treatment, the patient has consented to  Procedure(s): RECONSTRUCTION LEFT ANKLE/REPAIR SECONDARY DISRUPTED LIGAMENT ANKLE COLLATERAL, TRANSPLANTERIOR/TRANSFER TENDON TIBIAL EXTENSOR INTO MIDFOOT DEEP (Left) as a surgical intervention .  The patient's history has been reviewed, patient examined, no change in status, stable for surgery.  I have reviewed the patient's chart and labs.  Questions were answered to the patient's satisfaction.     Shubham Thackston JR,W D

## 2014-04-08 NOTE — Progress Notes (Signed)
Assisted Dr. Glennon Mac with left, popliteal/saphenous block. Side rails up, monitors on throughout procedure. See vital signs in flow sheet. Tolerated Procedure well.

## 2014-04-08 NOTE — Brief Op Note (Signed)
04/08/2014  10:53 AM  PATIENT:  Casey Cox  38 y.o. female  PRE-OPERATIVE DIAGNOSIS:  Sprain of tibiofibular ligament of left ankle  POST-OPERATIVE DIAGNOSIS:  Sprain of tibiofibular ligament of left ankle  PROCEDURE:  Procedure(s): RECONSTRUCTION LEFT ANKLE/REPAIR SECONDARY DISRUPTED LIGAMENT ANKLE COLLATERAL, TRANSPLANTERIOR/TRANSFER TENDON TIBIAL EXTENSOR INTO MIDFOOT DEEP (Left)  SURGEON:  Surgeon(s) and Role:    * W D Valeta Harms., MD - Primary  PHYSICIAN ASSISTANT: Chriss Czar, PA-C  ASSISTANTS:   ANESTHESIA:   regional and general  EBL:  Total I/O In: 1700 [I.V.:1700] Out: -   BLOOD ADMINISTERED:none  DRAINS: none   LOCAL MEDICATIONS USED:  NONE  SPECIMEN:  No Specimen  DISPOSITION OF SPECIMEN:  N/A  COUNTS:  YES  TOURNIQUET:   Total Tourniquet Time Documented: Thigh (Left) - 99442 minutes Total: Thigh (Left) - 99442 minutes   DICTATION: .Other Dictation: Dictation Number unknown  PLAN OF CARE: Discharge to home after PACU  PATIENT DISPOSITION:  PACU - hemodynamically stable.   Delay start of Pharmacological VTE agent (>24hrs) due to surgical blood loss or risk of bleeding: not applicable

## 2014-04-08 NOTE — Discharge Instructions (Signed)
Diet: As you were doing prior to hospitalization   Activity: Increase activity slowly as tolerated  No lifting or driving for 1 week  Shower: Keep splint in place clean/dry until follow up must cover if showering  Dressing/Splint: Keep splint in place clean/dry until follow up  Weight Bearing: touch down weight bearing for balance. Use a walker or  Crutches as instructed.   To prevent constipation: you may use a stool softener such as -  Colace ( over the counter) 100 mg by mouth twice a day  Drink plenty of fluids ( prune juice may be helpful) and high fiber foods  Miralax ( over the counter) for constipation as needed.   Precautions: If you experience chest pain or shortness of breath - call 911 immediately For transfer to the hospital emergency department!!  If you develop a fever greater that 101 F, purulent drainage from wound, increased redness or drainage from wound, or calf pain -- Call the office   Follow- Up Appointment: Please call for an appointment to be seen in 2 weeks  [place] - [phone]  Regional Anesthesia Blocks  1. Numbness or the inability to move the "blocked" extremity may last from 3-48 hours after placement. The length of time depends on the medication injected and your individual response to the medication. If the numbness is not going away after 48 hours, call your surgeon.  2. The extremity that is blocked will need to be protected until the numbness is gone and the  Strength has returned. Because you cannot feel it, you will need to take extra care to avoid injury. Because it may be weak, you may have difficulty moving it or using it. You may not know what position it is in without looking at it while the block is in effect.  3. For blocks in the legs and feet, returning to weight bearing and walking needs to be done carefully. You will need to wait until the numbness is entirely gone and the strength has returned. You should be able to move your leg  and foot normally before you try and bear weight or walk. You will need someone to be with you when you first try to ensure you do not fall and possibly risk injury.  4. Bruising and tenderness at the needle site are common side effects and will resolve in a few days.  5. Persistent numbness or new problems with movement should be communicated to the surgeon or the [place] [phone] [place] [phone]).Regional Anesthesia Blocks  1. Numbness or the inability to move the "blocked" extremity may last from 3-48 hours after placement. The length of time depends on the medication injected and your individual response to the medication. If the numbness is not going away after 48 hours, call your surgeon.  2. The extremity that is blocked will need to be protected until the numbness is gone and the  Strength has returned. Because you cannot feel it, you will need to take extra care to avoid injury. Because it may be weak, you may have difficulty moving it or using it. You may not know what position it is in without looking at it while the block is in effect.  3. For blocks in the legs and feet, returning to weight bearing and walking needs to be done carefully. You will need to wait until the numbness is entirely gone and the strength has returned. You should be able to move your leg and foot  normally before you try and bear weight or walk. You will need someone to be with you when you first try to ensure you do not fall and possibly risk injury.  4. Bruising and tenderness at the needle site are common side effects and will resolve in a few days.  5. Persistent numbness or new problems with movement should be communicated to the surgeon or the [facility] [phone] [place] [phone]).   Post Anesthesia Home Care Instructions  Activity: Get plenty of rest for the remainder of the day. A responsible adult should stay with  you for 24 hours following the procedure.  For the next 24 hours, DO NOT: -Drive a car -Paediatric nurse -Drink alcoholic beverages -Take any medication unless instructed by your physician -Make any legal decisions or sign important papers.  Meals: Start with liquid foods such as gelatin or soup. Progress to regular foods as tolerated. Avoid greasy, spicy, heavy foods. If nausea and/or vomiting occur, drink only clear liquids until the nausea and/or vomiting subsides. Call your physician if vomiting continues.  Special Instructions/Symptoms: Your throat may feel dry or sore from the anesthesia or the breathing tube placed in your throat during surgery. If this causes discomfort, gargle with warm salt water. The discomfort should disappear within 24 hours.

## 2014-04-08 NOTE — Anesthesia Procedure Notes (Addendum)
Anesthesia Regional Block:  Popliteal block  Pre-Anesthetic Checklist: ,, timeout performed, Correct Patient, Correct Site, Correct Laterality, Correct Procedure, Correct Position, site marked, Risks and benefits discussed,  Surgical consent,  Pre-op evaluation,  At surgeon's request and post-op pain management  Laterality: Left and Lower  Prep: chloraprep       Needles:  Injection technique: Single-shot  Needle Type: Echogenic Stimulator Needle     Needle Length: 9cm 9 cm Needle Gauge: 22 and 22 G    Additional Needles:  Procedures: nerve stimulator Popliteal block  Nerve Stimulator or Paresthesia:  Response: toe dorsiflexion, 0.45 mA, 0.1 ms,  Response: toe abduction, 0.45 mA, 0.1 ms,   Additional Responses:   Narrative:  Start time: 04/08/2014 8:14 AM End time: 04/08/2014 8:20 AM Injection made incrementally with aspirations every 5 mL.  Performed by: Personally   Additional Notes: Pt identified in Holding room.  Monitors applied. Working IV access confirmed. Sterile prep L lateral knee.  #22ga PNS to toe dorsiflexion and abduction twitches at 0.20mA threshold.  30cc 0.5% Bupivacaine with 1:200k epi injected incrementally after negative test dose. Reprep L prox ant-medial tibia, 10cc 0.5% Bupivacaine infiltrated subq for supplementation of saphenous nerve.  Patient asymptomatic, VSS, no heme aspirated, tolerated well.  Casey Seashore, MD   Procedure Name: LMA Insertion Date/Time: 04/08/2014 8:47 AM Performed by: Melynda Ripple D Pre-anesthesia Checklist: Patient identified, Emergency Drugs available, Suction available and Patient being monitored Patient Re-evaluated:Patient Re-evaluated prior to inductionOxygen Delivery Method: Circle System Utilized Preoxygenation: Pre-oxygenation with 100% oxygen Intubation Type: IV induction Ventilation: Mask ventilation without difficulty LMA: LMA inserted LMA Size: 4.0 Number of attempts: 1 Airway Equipment and Method: bite  block Placement Confirmation: positive ETCO2 Tube secured with: Tape Dental Injury: Teeth and Oropharynx as per pre-operative assessment

## 2014-04-08 NOTE — Anesthesia Postprocedure Evaluation (Signed)
  Anesthesia Post-op Note  Patient: Casey Cox  Procedure(s) Performed: Procedure(s): RECONSTRUCTION LEFT ANKLE, REPAIR SECONDARY DISRUPTED LIGAMENT ANKLE COLLATERAL, TRANSPLANTERIOR/TRANSFER TENDON TIBIAL EXTENSOR INTO MIDFOOT DEEP (Left)  Patient Location: PACU  Anesthesia Type:GA combined with regional for post-op pain  Level of Consciousness: awake, alert , oriented and patient cooperative  Airway and Oxygen Therapy: Patient Spontanous Breathing  Post-op Pain: mild  Post-op Assessment: Post-op Vital signs reviewed, Patient's Cardiovascular Status Stable, Respiratory Function Stable, Patent Airway, No signs of Nausea or vomiting and Adequate PO intake  Post-op Vital Signs: Reviewed and stable  Last Vitals:  Filed Vitals:   04/08/14 1130  BP: 117/65  Pulse: 107  Temp:   Resp: 17    Complications: No apparent anesthesia complications

## 2014-04-08 NOTE — Transfer of Care (Signed)
Immediate Anesthesia Transfer of Care Note  Patient: Casey Cox  Procedure(s) Performed: Procedure(s): RECONSTRUCTION LEFT ANKLE, REPAIR SECONDARY DISRUPTED LIGAMENT ANKLE COLLATERAL, TRANSPLANTERIOR/TRANSFER TENDON TIBIAL EXTENSOR INTO MIDFOOT DEEP (Left)  Patient Location: PACU  Anesthesia Type:General and Regional  Level of Consciousness: sedated  Airway & Oxygen Therapy: Patient Spontanous Breathing and Patient connected to face mask oxygen  Post-op Assessment: Report given to PACU RN and Post -op Vital signs reviewed and stable  Post vital signs: Reviewed and stable  Complications: No apparent anesthesia complications

## 2014-04-09 ENCOUNTER — Encounter (HOSPITAL_BASED_OUTPATIENT_CLINIC_OR_DEPARTMENT_OTHER): Payer: Self-pay | Admitting: Orthopedic Surgery

## 2014-04-09 NOTE — Op Note (Signed)
NAME:  Casey Cox, Casey Cox NO.:  [id number]  MEDICAL RECORD NO.:  [id number]  LOCATION:                                 FACILITY:  PHYSICIAN:  Lockie Pares, M.D.    DATE OF BIRTH:  Oct 20, 1975  DATE OF PROCEDURE:  04/08/2014 DATE OF DISCHARGE:  04/08/2014                              OPERATIVE REPORT   INDICATIONS:  The patient with ligamentous instability of the left ankle, thought to be amenable to outpatient surgery.  PREOPERATIVE DIAGNOSIS:  Lateral ankle ligamentous instability.  POSTOPERATIVE DIAGNOSIS:  Lateral ankle ligamentous instability.  OPERATION: 1. Ankle reconstruction. 2. Modified Brostrom reconstruction with local augmentation of     autogenous peroneus brevis tendon graft.  SURGEON:  Lockie Pares, M.D.  ASSISTANT:  Chriss Czar, PA-C.  TOURNIQUET:  Approximately 1 hour and 30 minutes.  DESCRIPTION OF PROCEDURE:  Sterile prep and drape, exsanguination of leg, inflation of the tourniquet to 350.  Extension was made over the distal fibula curvilinear.  We extended it into the foot proper towards the base of the 5th metatarsal.  We identified the peroneus brevis and split it distally based and dissected it up into the muscle belly to obtain a good length of the tendon.  Specifically, we then placed a drill hole from anterior to posterior about 1 inch above the tip of the fibula.  We then took down the remnant of the anterior talofibular ligament as a flap to imbricate it later.  We then placed the peroneus brevis graft underneath the flap and into the hole in the fibula from anterior to posterior then back on itself to create a very snug repair with good resolution of the talar tilt in the anterior drawer.  We then oversewed this on itself on top of the anterior hole with the foot in dorsiflexion and eversion with multiple 2-0 FiberWires.  We then traced each point of entrance into the soft tissue flaps and tied those with FiberWires as  well and then imbricated the soft tissue flap in the anterior talofibular ligament.  This restored the ankle stability nicely, wound was irrigated, the split in the peroneal tendon sheath, which was not complete was closed with interrupted Vicryl, the subcutaneous tissue with 2-0 Vicryl, and the skin with nylon.  Lightly compressive sterile dressing, fiberglass splint was applied with the foot in dorsiflexion and eversion, taken to the recovery room in a stable condition.     Lockie Pares, M.D.     WDC/MEDQ  D:  04/08/2014  T:  04/08/2014  Job:  [id number]

## 2014-04-29 ENCOUNTER — Telehealth: Payer: Self-pay | Admitting: Family Medicine

## 2014-04-29 NOTE — Telephone Encounter (Signed)
Per pt call left a msg on vm  To do a  [facility] referral to dr [facility]  appt 04-30-2014  [facility]: Dermatologist  Address: [address], [place], [place] [address]  [phone]  Confirmation Thank you for your online Referral submission. Your referral case information was transmitted on 04/29/2014 at 03:57 PM CST Your Referral Number is [id number]

## 2014-04-30 ENCOUNTER — Telehealth: Payer: Self-pay | Admitting: Family Medicine

## 2014-04-30 NOTE — Telephone Encounter (Signed)
Pt would like cpx before end of yr. Can I create 30 min slot?

## 2014-04-30 NOTE — Telephone Encounter (Signed)
Ok

## 2014-05-01 NOTE — Telephone Encounter (Signed)
Pt has been sch

## 2014-05-08 ENCOUNTER — Ambulatory Visit (INDEPENDENT_AMBULATORY_CARE_PROVIDER_SITE_OTHER): Payer: 59 | Admitting: Internal Medicine

## 2014-05-08 ENCOUNTER — Encounter: Payer: Self-pay | Admitting: Internal Medicine

## 2014-05-08 ENCOUNTER — Other Ambulatory Visit: Payer: Self-pay | Admitting: *Deleted

## 2014-05-08 VITALS — BP 108/72 | HR 113 | Temp 98.1°F | Resp 12 | Wt 187.0 lb

## 2014-05-08 DIAGNOSIS — E0590 Thyrotoxicosis, unspecified without thyrotoxic crisis or storm: Secondary | ICD-10-CM

## 2014-05-08 DIAGNOSIS — R635 Abnormal weight gain: Secondary | ICD-10-CM

## 2014-05-08 LAB — T4, FREE: FREE T4: 0.84 ng/dL (ref 0.60–1.60)

## 2014-05-08 LAB — TSH: TSH: 0.35 u[IU]/mL (ref 0.35–4.50)

## 2014-05-08 LAB — T3, FREE: T3, Free: 4.1 pg/mL (ref 2.3–4.2)

## 2014-05-08 NOTE — Progress Notes (Signed)
Patient ID: Casey Cox, female   DOB: 1976-04-30, 38 y.o.   MRN: [id number]  HPI: Casey Cox is a 38 y.o. female, initially referred by Pt's PCP, Dr Maudie Mercury, At last visit, we checked her for PCOS and the labwork was negative. She is here to discuss weight gain and also for her subclinical hyperthyroidism. Last visit 3 mo ago. ObGyn: Dr Lynnette Caffey.  At last visits, we checked her for PCOS and the labwork was negative. We also checked her for Cushing's dsd. >> negative. She had  hyperthyroidism.  She had 3 surgeries since I last saw her. She is sedentary since July 2015 >> again frustrated Re: weight.  Weight gain: - from 145 to 187 (today 187 lbs, but with boot on) - narcotics >> nausea >> tries to keep food in - she tried Phentermin and Qsymia >> did not work - saw dietitian - Meals:  - Breakfast: whole wheat toast and fruit - Lunch: salad or leftovers  - Dinner: protein source + veggies - Snacks: 1-3: fruit or veggies or crackers + humus - she started PT before previousankle sx, restarted PT yesterday after the new sx   - Last HbA1c: Lab Results  Component Value Date   HGBA1C 5.4 11/14/2013   Subclinical hyperthyroidism: Pt has had mild hypothyroidism and she was started on Levothyroxine 25 mcg daily >> helped her daily fatigue greatly, but TSH became suppressed >> 08/2013: TSH 0.17 >> taken off the Levothyroxine then.   At last check, still low >> subclinical hyperthyroidism.  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 Palpitations when stressed/+ leg swelling Respiratory: no cough/SOB Gastrointestinal: + N/+ V/no D/C/+ heartburn Musculoskeletal: + all: muscle/joint aches Skin: no acne, no excess hair on face; + easy bruising Neurological: no tremors/no numbness/tingling/dizziness, + HA  I reviewed pt's medications, allergies, PMH, social hx, family hx and no changes required, except as mentioned above.  PE: BP 108/72 mmHg  Pulse 113  Temp(Src) 98.1 F (36.7 C) (Oral)  Resp 12  Wt 187 lb (84.823 kg)  SpO2 97% Body mass index is 33.13 kg/(m^2).  Wt Readings from Last 3 Encounters:  05/08/14 187 lb (84.823 kg)  04/08/14 180 lb 9.6 oz (81.92 kg)  02/03/14 187 lb (84.823 kg)   Constitutional: overweight, in NAD, no full supraclavicular fat pads Eyes: PERRLA, EOMI, no exophthalmos ENT: moist mucous membranes, no thyromegaly, no cervical lymphadenopathy Cardiovascular: RRR, No MRG Respiratory: CTA B Gastrointestinal: abdomen soft, NT, ND, BS+ Musculoskeletal: no deformities, strength intact in all 4, L foot in boot Skin: moist, warm; no acne on face Neurological: no tremor with outstretched hands, DTR normal in all 4  ASSESSMENT: 1. Weight gain - No PCOS - see below  2. Subclinical hyperthyroidism Component     Latest Ref Rng  11/14/2013  Testosterone     10 - 70 ng/dL 29  Sex Hormone Binding     18 - 114 nmol/L 34  Testosterone Free     0.6 - 6.8 pg/mL 5.1  Testosterone-% Free     0.4 - 2.4 % 1.8  Estradiol, Free      0.81  Estradiol      37  Results received      11/21/13     Hemoglobin A1C     4.6 - 6.5 % 5.4        Prolactin      8.4  17-OH-Progesterone, LC/MS/MS      19  LH      9.85  FSH      5.1  Androstenedione      108   Component     Latest Ref Rng 01/26/2014   Testosterone     10 - 70 ng/dL 41  Sex Hormone Binding     18 - 114 nmol/L 60  Testosterone Free     0.6 - 6.8 pg/mL 5.0  Testosterone-% Free     0.4 - 2.4 % 1.2   PLAN: 1. Weight gain - pt very frustrated by her weight gain - but now again in a boot after having ankle sx on the L foot  - we did not dx PCOS  - we r/o Cushing by a normal dexametasone suppression test (DST) - Cortisol 0.9 - no hypothyroidism - no pregnancy or menopause - continue to work on diet and exercise  2. Subclinical hyperthyroidism - TSH <LLN, but improving - we reviewed her levels >> will continue to follow for now. She has many sxs (unclear if related to the low TSH) and has a high pulse, but has had this before. - check TSH, fT4 and fT3 today If these labs are worse >> will need an uptake and scan - we will need another check in 6 mo.  Office Visit on 05/08/2014  Component Date Value Ref Range Status  . TSH 05/08/2014 0.35  0.35 - 4.50 uIU/mL Final  . T3, Free 05/08/2014 4.1  2.3 - 4.2 pg/mL Final  . Free T4 05/08/2014 0.84  0.60 - 1.60 ng/dL Final   TFTs now normal >> will repeat them at next visit.

## 2014-05-08 NOTE — Patient Instructions (Addendum)
Please stop at the lab. Please come back for a follow-up appointment in 6 months. Hang in there!

## 2014-05-19 ENCOUNTER — Ambulatory Visit (INDEPENDENT_AMBULATORY_CARE_PROVIDER_SITE_OTHER): Payer: 59 | Admitting: Family Medicine

## 2014-05-19 ENCOUNTER — Encounter: Payer: Self-pay | Admitting: Family Medicine

## 2014-05-19 VITALS — BP 118/74 | HR 110 | Temp 97.3°F | Ht 63.0 in | Wt 187.9 lb

## 2014-05-19 DIAGNOSIS — G4700 Insomnia, unspecified: Secondary | ICD-10-CM

## 2014-05-19 DIAGNOSIS — Q796 Ehlers-Danlos syndrome, unspecified: Secondary | ICD-10-CM

## 2014-05-19 DIAGNOSIS — F329 Major depressive disorder, single episode, unspecified: Secondary | ICD-10-CM

## 2014-05-19 DIAGNOSIS — F32A Depression, unspecified: Secondary | ICD-10-CM

## 2014-05-19 DIAGNOSIS — F411 Generalized anxiety disorder: Secondary | ICD-10-CM

## 2014-05-19 DIAGNOSIS — Z0000 Encounter for general adult medical examination without abnormal findings: Secondary | ICD-10-CM

## 2014-05-19 LAB — LIPID PANEL
CHOLESTEROL: 130 mg/dL (ref 0–200)
HDL: 47.8 mg/dL (ref >39.00)
LDL Cholesterol: 67 mg/dL (ref 0–99)
NONHDL: 82.2
Total CHOL/HDL Ratio: 3
Triglycerides: 74 mg/dL (ref 0.0–149.0)
VLDL: 14.8 mg/dL (ref 0.0–40.0)

## 2014-05-19 NOTE — Progress Notes (Signed)
Pre visit review using our clinic review tool, if applicable. No additional management support is needed unless otherwise documented below in the visit note. 

## 2014-05-19 NOTE — Progress Notes (Signed)
HPI:  Here for CPE:  -Concerns and/or follow up today:   Casey Cox is a pleasant 38 yo F whom unfortunately is very anxious about her health.She has possible fibromyalgia, anxiety, depression and weight issues. She told me she hates seeing psychiatrist or pscyhologists and had not gotten the psychiatric care I have advised she needed in the past. She did agree to see psych in 11/2013. Reports is getting counseling with [facility] and report counselor is working to help set up appointment to see psychiatrist. Wearing cast for ortho surgery gave her claustrophobic symptoms and panic attacks. She has had several orthopedic surgeries, lap surg for endometriosis, jaw surgery for her TMJ. Seeing Dr. Charlestine Night for her chronic pain and reports new dx elhers danlos and fibromyalgia. Getting CPE through her orthopedic doctor. since I last saw her. She sees Dr. Cruzita Lederer for subclinical hyperthyroidism. She is very worried about getting addicted to pain medications and her benzo and is using these as little as possible  -Diet: variety of foods, balance and well rounded, larger portion sizes  -Exercise: doing physical therapy 3-5 times per week  -Taking folic acid, vitamin D or calcium: yes  -Diabetes and Dyslipidemia Screening: diabetes screening done, wants lipid panel today  -Hx of HTN: no  -Vaccines: UTD  -pap history: sees UTD  -FDLMP: irr with IUD  -sexual activity: yes, female partner, normal  -wants STI testing: no  -FH breast, colon or ovarian ca: see FH Last mammogram: n/a Last colon cancer screening:n/a  -Alcohol, Tobacco, drug use: see social history  Review of Systems - no fevers, unintentional weight loss, vision loss, hearing loss, chest pain, sob, hemoptysis, melena, hematochezia, hematuria, genital discharge, changing or concerning skin lesions, bleeding, bruising, loc, thoughts of self harm or SI  Past Medical History  Diagnosis Date  . Fibromyalgia   . Anxiety   .  Migraine   . TMJ syndrome   . Osteoarthritis 05/2012    jaw, left ankle, knees  . Interstitial cystitis   . Tibiofibular ligament sprain, distal 03/2014    left ankle  . Acne   . History of melasma   . Asymptomatic PVCs     Past Surgical History  Procedure Laterality Date  . Mandible fracture surgery  06/30/12    ARTHOCENTESIS OF THE JAW(tmj)  . Wisdom tooth extraction    . Laparoscopy N/A 06/24/2013    Procedure: LAPAROSCOPY OPERATIVE WITH CHROMOPERTUBATION, LYSIS OF ADHESIONS AND APPENDECTOMY ;  Surgeon: Linda Hedges, DO;  Location: [facility];  Service: Gynecology;  Laterality: N/A;  . Appendectomy  06/24/2013  . Arthrotomy Bilateral 12/23/2013    Procedure: BILATERAL ARTHROTOMY, MENISECTOMY, TEMPORALIS FASCIA GRAFT;  Surgeon: Gae Bon, DDS;  Location: [facility];  Service: Oral Surgery;  Laterality: Bilateral;  . Ligament repair Right 01/13/2014    Procedure: RIGHT ANKLE REPAIR SECONDAY DISRUPTED LIGAMENT ANKLE  COLLATERAL WITH PERONEAL TENDON AUTOGRAFT;  Surgeon: Yvette Rack., MD;  Location: [facility];  Service: Orthopedics;  Laterality: Right;  . Lasik Bilateral   . Ankle reconstruction Left 04/08/2014    Procedure: RECONSTRUCTION LEFT ANKLE, REPAIR SECONDARY DISRUPTED LIGAMENT ANKLE COLLATERAL, TRANSPLANTERIOR/TRANSFER TENDON TIBIAL EXTENSOR INTO MIDFOOT DEEP;  Surgeon: Yvette Rack., MD;  Location: [facility];  Service: Orthopedics;  Laterality: Left;    Family History  Problem Relation Age of Onset  . Fibromyalgia Mother   . Arthritis Mother   . Cancer Mother     SKIN CA  . Diabetes  Father   . Arthritis Father   . Mental illness Maternal Grandmother   . Cancer Maternal Grandfather     PROSTATE CA  . Diabetes Maternal Grandfather   . Diabetes Paternal Grandmother   . Hyperlipidemia Paternal Grandmother   . Hypertension Paternal Grandmother   . Hypertension Paternal Grandfather   . Arthritis Paternal Grandfather   . Heart disease Paternal  Grandfather     History   Social History  . Marital Status: Married    Spouse Name: N/A    Number of Children: N/A  . Years of Education: N/A   Social History Main Topics  . Smoking status: Never Smoker   . Smokeless tobacco: Never Used  . Alcohol Use: Yes     Comment: occasionally  . Drug Use: No  . Sexual Activity:    Partners: Female    Museum/gallery curator: None     Comment: partner   Other Topics Concern  . None   Social History Narrative   Work or School: full time [employer] Situation: lives with female partner - trying for a child      Spiritual Beliefs:      Lifestyle:                Current outpatient prescriptions: Cholecalciferol (VITAMIN D PO), Take by mouth daily., Disp: , Rfl: ;  CLINDAMYCIN PHOS-BENZOYL PEROX EX, Apply topically., Disp: , Rfl: ;  cyclobenzaprine (FLEXERIL) 10 MG tablet, Take 10 mg by mouth at bedtime., Disp: , Rfl: ;  hydroquinone 4 % cream, Apply topically 2 (two) times daily., Disp: , Rfl: ;  levonorgestrel (MIRENA) 20 MCG/24HR IUD, 1 each by Intrauterine route once., Disp: , Rfl:  Multiple Vitamin (MULTIVITAMIN) capsule, Take 1 capsule by mouth daily., Disp: , Rfl: ;  Omega-3 Fatty Acids (FISH OIL PO), Take by mouth daily., Disp: , Rfl: ;  spironolactone (ALDACTONE) 25 MG tablet, Take 25 mg by mouth daily., Disp: , Rfl: ;  tretinoin (RETIN-A) 0.025 % cream, Apply 1 application topically at bedtime., Disp: , Rfl: ;  ALPRAZolam (XANAX) 0.5 MG tablet, Take 0.5 mg by mouth 2 (two) times daily. , Disp: , Rfl:  diphenhydrAMINE (BENADRYL) 25 MG tablet, Take 25 mg by mouth every 6 (six) hours as needed., Disp: , Rfl: ;  famotidine (PEPCID) 20 MG tablet, Take 20 mg by mouth 2 (two) times daily., Disp: , Rfl: ;  HYDROcodone-acetaminophen (NORCO/VICODIN) 5-325 MG per tablet, Take 1 tablet by mouth every 6 (six) hours as needed for moderate pain., Disp: , Rfl:  ondansetron (ZOFRAN) 4 MG tablet, Take 4 mg by mouth every 8 (eight) hours as  needed for nausea or vomiting., Disp: , Rfl: ;  oxyCODONE-acetaminophen (ROXICET) 5-325 MG per tablet, Take 1-2 tablets by mouth every 4 (four) hours as needed for severe pain. (Patient not taking: Reported on 05/19/2014), Disp: 90 tablet, Rfl: 0 promethazine (PHENERGAN) 12.5 MG tablet, Take 1 tablet (12.5 mg total) by mouth every 6 (six) hours as needed for nausea or vomiting. (Patient not taking: Reported on 05/19/2014), Disp: 40 tablet, Rfl: 0;  SUMAtriptan (IMITREX) 50 MG tablet, Take 50 mg by mouth as needed for migraine or headache. May repeat in 2 hours if headache persists or recurs., Disp: , Rfl:  traMADol (ULTRAM) 50 MG tablet, Take by mouth every 6 (six) hours as needed., Disp: , Rfl:   EXAM:  Filed Vitals:   05/19/14 0957  BP: 118/74  Pulse: 110  Temp: 97.3  F (36.3 C)    GENERAL: vitals reviewed and listed below, alert, oriented, appears well hydrated and in no acute distress  HEENT: head atraumatic, PERRLA, normal appearance of eyes, ears, nose and mouth. moist mucus membranes.  NECK: supple, no masses or lymphadenopathy  LUNGS: clear to auscultation bilaterally, no rales, rhonchi or wheeze  CV: HRRR, no peripheral edema or cyanosis, normal pedal pulses  BREAST:declined  GU: declined  RECTAL: refused  SKIN: no rash or abnormal lesions  MS: normal gait, moves all extremities normally  PSYCH: normal affect, pleasant and cooperative  ASSESSMENT AND PLAN:  Discussed the following assessment and plan:  Visit for preventive health examination - Plan: Lipid Panel  -supported and counseled on a rough year and am glad she is going to be seeing a psychiatrist.  -Discussed and advised all [place] preventive services health task force level A and B recommendations for age, sex and risks.  -Advised at least 150 minutes of exercise per week and a healthy diet low in saturated fats and sweets and consisting of fresh fruits and vegetables, lean meats such as fish and white  chicken and whole grains.  -FASTING labs, studies and vaccines per orders this encounter  Orders Placed This Encounter  Procedures  . Lipid Panel    Patient advised to return to clinic immediately if symptoms worsen or persist or new concerns.  Patient Instructions       Return in about 6 months (around 11/18/2014) for follow up.  Colin Benton R.

## 2014-09-30 ENCOUNTER — Other Ambulatory Visit: Payer: Self-pay | Admitting: Obstetrics and Gynecology

## 2014-09-30 ENCOUNTER — Other Ambulatory Visit (HOSPITAL_COMMUNITY): Payer: Self-pay | Admitting: Obstetrics and Gynecology

## 2014-09-30 DIAGNOSIS — N649 Disorder of breast, unspecified: Secondary | ICD-10-CM

## 2014-10-01 ENCOUNTER — Encounter: Payer: Self-pay | Admitting: Family Medicine

## 2014-10-01 ENCOUNTER — Ambulatory Visit
Admission: RE | Admit: 2014-10-01 | Discharge: 2014-10-01 | Disposition: A | Discharge status: Home / Self Care | Payer: 59 | Source: Ambulatory Visit | Attending: Obstetrics and Gynecology | Admitting: Obstetrics and Gynecology

## 2014-10-01 ENCOUNTER — Ambulatory Visit (INDEPENDENT_AMBULATORY_CARE_PROVIDER_SITE_OTHER): Payer: 59 | Admitting: Family Medicine

## 2014-10-01 ENCOUNTER — Ambulatory Visit: Payer: 59 | Admitting: Family Medicine

## 2014-10-01 VITALS — BP 118/82 | HR 123 | Temp 97.8°F | Ht 63.0 in | Wt 183.8 lb

## 2014-10-01 DIAGNOSIS — M25569 Pain in unspecified knee: Secondary | ICD-10-CM

## 2014-10-01 DIAGNOSIS — H6991 Unspecified Eustachian tube disorder, right ear: Secondary | ICD-10-CM | POA: Diagnosis not present

## 2014-10-01 DIAGNOSIS — Z6832 Body mass index (BMI) 32.0-32.9, adult: Secondary | ICD-10-CM

## 2014-10-01 DIAGNOSIS — N649 Disorder of breast, unspecified: Secondary | ICD-10-CM

## 2014-10-01 DIAGNOSIS — M797 Fibromyalgia: Secondary | ICD-10-CM

## 2014-10-01 DIAGNOSIS — G894 Chronic pain syndrome: Secondary | ICD-10-CM | POA: Diagnosis not present

## 2014-10-01 DIAGNOSIS — L709 Acne, unspecified: Secondary | ICD-10-CM

## 2014-10-01 DIAGNOSIS — F39 Unspecified mood [affective] disorder: Secondary | ICD-10-CM

## 2014-10-01 DIAGNOSIS — Q796 Ehlers-Danlos syndrome, unspecified: Secondary | ICD-10-CM

## 2014-10-01 NOTE — Progress Notes (Signed)
Pre visit review using our clinic review tool, if applicable. No additional management support is needed unless otherwise documented below in the visit note. 

## 2014-10-01 NOTE — Patient Instructions (Signed)
We place the referral per your request  AFRIN twice daily for 4 days then STOP  Flonase 2 sprays R nostril daily for 21 days  Please continue counseling and see a psychiatrist for medication management for you anxiety and focus issues

## 2014-10-01 NOTE — Progress Notes (Signed)
HPI:  Casey Cox is a 39 yo pleasant F patient whom unfortunately is very anxious about her health. She has possible fibromyalgia, anxiety, depression, panic disorder, Ehlers danlos, TMJ disorder, endometriosis, subclinical hyperthyroidism, chronic pain and weight issues.  She did agree to see psych in 11/2013 and at her last visit in 04/2014 reported she was getting counseling with [facility] and reported at the time counselor was setting up appointment for her to see psychiatrist.  She has had several orthopedic surgeries, lap surg for endometriosis, jaw surgery for her TMJ.   Today she presents here for an acute visit for:  Anxiety/Insomnia/Concentration concerns: -reports: she is seeing a Social worker, she is not seeing a psychiatrist and wants testing for ADD -she has chronic generalized anxiety, panic disorder and poor focus -brother has ADHD -has not seen a psychiatrist but she will set this up - se wants to know what recommendations we have -denies: worsening, thoughts of self harm  Bilateral ear pain and wax in ears: -reports since TMJ surgery last year, and told was related to this -also worried could be wax and wants to check for this -has tried hydrogen peroxide drops -hearing seems a little reduced -denies drainage, tinittus  Needs referrals for current specilists: Dr. Charlestine Night, Rheum for fibromyalgia, ehlers danlos, chronic pain Dr. Renda Rolls - dermatology for acne and PCOS (endocrine notes report neg eval for PCOS) Dr. French Ana - ortho, Raliegh Ip for osteoarthritis  ROS: See pertinent positives and negatives per HPI.  Past Medical History  Diagnosis Date  . Fibromyalgia   . Anxiety and depression     panic disorder, insomnia  . Migraine   . TMJ syndrome   . Osteoarthritis 05/2012    jaw, left ankle, knees  . Interstitial cystitis   . Tibiofibular ligament sprain, distal 03/2014    left ankle  . Acne   . History of melasma   . Asymptomatic PVCs   .  Ehlers-Danlos syndrome     per pt dx with her rheuamtologist  . Endometriosis   . Subclinical hyperthyroidism     Past Surgical History  Procedure Laterality Date  . Mandible fracture surgery  06/30/12    ARTHOCENTESIS OF THE JAW(tmj)  . Wisdom tooth extraction    . Laparoscopy N/A 06/24/2013    Procedure: LAPAROSCOPY OPERATIVE WITH CHROMOPERTUBATION, LYSIS OF ADHESIONS AND APPENDECTOMY ;  Surgeon: Linda Hedges, DO;  Location: [facility];  Service: Gynecology;  Laterality: N/A;  . Appendectomy  06/24/2013  . Arthrotomy Bilateral 12/23/2013    Procedure: BILATERAL ARTHROTOMY, MENISECTOMY, TEMPORALIS FASCIA GRAFT;  Surgeon: Gae Bon, DDS;  Location: [facility];  Service: Oral Surgery;  Laterality: Bilateral;  . Ligament repair Right 01/13/2014    Procedure: RIGHT ANKLE REPAIR SECONDAY DISRUPTED LIGAMENT ANKLE  COLLATERAL WITH PERONEAL TENDON AUTOGRAFT;  Surgeon: Yvette Rack., MD;  Location: [facility];  Service: Orthopedics;  Laterality: Right;  . Lasik Bilateral   . Ankle reconstruction Left 04/08/2014    Procedure: RECONSTRUCTION LEFT ANKLE, REPAIR SECONDARY DISRUPTED LIGAMENT ANKLE COLLATERAL, TRANSPLANTERIOR/TRANSFER TENDON TIBIAL EXTENSOR INTO MIDFOOT DEEP;  Surgeon: Yvette Rack., MD;  Location: [facility];  Service: Orthopedics;  Laterality: Left;    Family History  Problem Relation Age of Onset  . Fibromyalgia Mother   . Arthritis Mother   . Cancer Mother     SKIN CA  . Diabetes Father   . Arthritis Father   . Mental illness Maternal Grandmother   . Cancer  Maternal Grandfather     PROSTATE CA  . Diabetes Maternal Grandfather   . Diabetes Paternal Grandmother   . Hyperlipidemia Paternal Grandmother   . Hypertension Paternal Grandmother   . Hypertension Paternal Grandfather   . Arthritis Paternal Grandfather   . Heart disease Paternal Grandfather     History   Social History  . Marital Status: Married    Spouse Name: N/A  . Number of  Children: N/A  . Years of Education: N/A   Social History Main Topics  . Smoking status: Never Smoker   . Smokeless tobacco: Never Used  . Alcohol Use: Yes     Comment: occasionally  . Drug Use: No  . Sexual Activity:    Partners: Female    Museum/gallery curator: None     Comment: partner   Other Topics Concern  . None   Social History Narrative   Work or School: full time [employer] Situation: lives with female partner - trying for a child      Spiritual Beliefs:      Lifestyle:                 Current outpatient prescriptions:  .  ALPRAZolam (XANAX) 0.5 MG tablet, Take 0.5 mg by mouth 2 (two) times daily. , Disp: , Rfl:  .  b complex vitamins tablet, Take 1 tablet by mouth daily., Disp: , Rfl:  .  Cholecalciferol (VITAMIN D PO), Take by mouth daily., Disp: , Rfl:  .  CLINDAMYCIN PHOS-BENZOYL PEROX EX, Apply topically., Disp: , Rfl:  .  cyclobenzaprine (FLEXERIL) 10 MG tablet, Take 10 mg by mouth at bedtime., Disp: , Rfl:  .  diphenhydrAMINE (BENADRYL) 25 MG tablet, Take 25 mg by mouth every 6 (six) hours as needed., Disp: , Rfl:  .  famotidine (PEPCID) 20 MG tablet, Take 20 mg by mouth 2 (two) times daily., Disp: , Rfl:  .  HYDROcodone-acetaminophen (NORCO/VICODIN) 5-325 MG per tablet, Take 1 tablet by mouth every 6 (six) hours as needed for moderate pain., Disp: , Rfl:  .  hydroquinone 4 % cream, Apply topically 2 (two) times daily., Disp: , Rfl:  .  levonorgestrel (MIRENA) 20 MCG/24HR IUD, 1 each by Intrauterine route once., Disp: , Rfl:  .  Meth-Hyo-M Bl-Na Phos-Ph Sal (URIBEL PO), Take by mouth 3 (three) times daily., Disp: , Rfl:  .  Multiple Vitamin (MULTIVITAMIN) capsule, Take 1 capsule by mouth daily., Disp: , Rfl:  .  Omega-3 Fatty Acids (FISH OIL PO), Take by mouth daily., Disp: , Rfl:  .  ondansetron (ZOFRAN) 4 MG tablet, Take 4 mg by mouth every 8 (eight) hours as needed for nausea or vomiting., Disp: , Rfl:  .  oxyCODONE-acetaminophen (ROXICET)  5-325 MG per tablet, Take 1-2 tablets by mouth every 4 (four) hours as needed for severe pain., Disp: 90 tablet, Rfl: 0 .  promethazine (PHENERGAN) 12.5 MG tablet, Take 1 tablet (12.5 mg total) by mouth every 6 (six) hours as needed for nausea or vomiting., Disp: 40 tablet, Rfl: 0 .  spironolactone (ALDACTONE) 100 MG tablet, , Disp: , Rfl:  .  SUMAtriptan (IMITREX) 50 MG tablet, Take 50 mg by mouth as needed for migraine or headache. May repeat in 2 hours if headache persists or recurs., Disp: , Rfl:  .  traMADol (ULTRAM) 50 MG tablet, Take by mouth every 6 (six) hours as needed., Disp: , Rfl:  .  tretinoin (RETIN-A) 0.025 % cream, Apply 1  application topically at bedtime., Disp: , Rfl:   EXAM:  Filed Vitals:   10/01/14 1340  BP: 118/82  Pulse: 123  Temp: 97.8 F (36.6 C)    Body mass index is 32.57 kg/(m^2).  GENERAL: vitals reviewed and listed above, alert, oriented, appears well hydrated and in no acute distress  HEENT: atraumatic, conjunttiva clear, no obvious abnormalities on inspection of external nose and ears, Clear effusion R ear, very small amount of soft wax in R ear canal, L ear exam normal  NECK: no obvious masses on inspection  LUNGS: clear to auscultation bilaterally, no wheezes, rales or rhonchi, good air movement  CV: HRRR, no peripheral edema  MS: moves all extremities without noticeable abnormality  PSYCH: pleasant and cooperative, no obvious depression or anxiety  ASSESSMENT AND PLAN:  Discussed the following assessment and plan:  Fibromyalgia - Plan: Ambulatory referral to Rheumatology  Anxiety, Depression, Insomnia, Panic disorder -advised CBT -advised medical management with psychiatrist  Eustachian tube disorder, right -AFRIN for 4 days, then INS for 21 days -follow up or ENT if persists  Chronic pain syndrome - Plan: Ambulatory referral to Rheumatology -referred to current specialist per her request for insurance  Ehlers-Danlos disease -  Plan: Ambulatory referral to Rheumatology -see above  Acne, unspecified acne type - Plan: Ambulatory referral to Dermatology  Knee pain, unspecified laterality - Plan: AMB referral to orthopedics  BMI 32.0-32.9,adult -lifestyle recs  -Patient advised to return or notify a doctor immediately if symptoms worsen or persist or new concerns arise.  Patient Instructions  We place the referral per your request  AFRIN twice daily for 4 days then STOP  Flonase 2 sprays R nostril daily for 21 days  Please continue counseling and see a psychiatrist for medication management for you anxiety and focus issues       KIM, Jarrett Soho R.

## 2014-10-02 LAB — CYTOLOGY - PAP

## 2014-10-20 ENCOUNTER — Ambulatory Visit: Payer: Self-pay | Admitting: Family Medicine

## 2014-10-22 ENCOUNTER — Ambulatory Visit: Payer: Self-pay | Admitting: Psychology

## 2014-11-09 ENCOUNTER — Ambulatory Visit: Payer: 59 | Admitting: Internal Medicine

## 2015-01-29 ENCOUNTER — Encounter (HOSPITAL_BASED_OUTPATIENT_CLINIC_OR_DEPARTMENT_OTHER): Payer: Self-pay | Admitting: *Deleted

## 2015-02-03 ENCOUNTER — Telehealth: Payer: Self-pay | Admitting: Family Medicine

## 2015-02-03 NOTE — Telephone Encounter (Signed)
The dermatology dr she saw at referral appt , Dr Creed Copper states the wrong dr for the referral was done and they are having trouble getting paid.

## 2015-02-05 ENCOUNTER — Ambulatory Visit (HOSPITAL_BASED_OUTPATIENT_CLINIC_OR_DEPARTMENT_OTHER): Admission: RE | Admit: 2015-02-05 | Payer: 59 | Source: Ambulatory Visit | Admitting: Obstetrics & Gynecology

## 2015-02-05 ENCOUNTER — Encounter (HOSPITAL_BASED_OUTPATIENT_CLINIC_OR_DEPARTMENT_OTHER): Admission: RE | Payer: Self-pay | Source: Ambulatory Visit

## 2015-02-05 HISTORY — DX: Pelvic and perineal pain unspecified side: R10.20

## 2015-02-05 HISTORY — DX: Pelvic and perineal pain: R10.2

## 2015-02-05 SURGERY — LAPAROSCOPY, DIAGNOSTIC
Anesthesia: General

## 2015-04-21 IMAGING — NM NM HEPATO W/GB/PHARM/[PERSON_NAME]
1 series · 18 of 18 positions shown · non-contrast
Comparison: None.

RADIOPHARMACEUTICALS:  L.L5Si Lc-VVm Choletec

CLINICAL DATA: Epigastric pain

EXAM:
NUCLEAR MEDICINE HEPATOBILIARY IMAGING WITH GALLBLADDER EF
TECHNIQUE: Sequential images of the abdomen were obtained [DATE] minutes
following intravenous administration of radiopharmaceutical. After
oral ingestion of Ensure, gallbladder ejection fraction was
determined. At 60 min, normal ejection fraction is greater than 33%.

[Series 1: hepato · 4.46mm/px · 3 acquisitions, 18 frames shown]
[im 1/3]
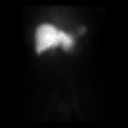
[im 1/3]
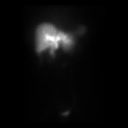
[im 1/3]
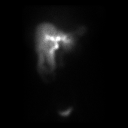
[im 1/3]
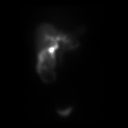
[im 1/3]
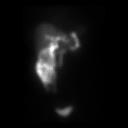
[im 1/3]
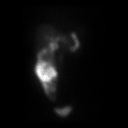
[im 2/3]
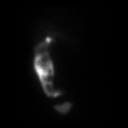
[im 2/3]
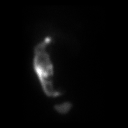
[im 2/3]
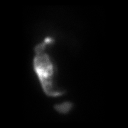
[im 2/3]
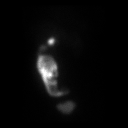
[im 2/3]
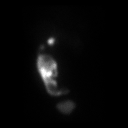
[im 2/3]
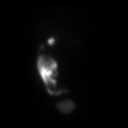
[im 3/3]
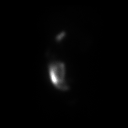
[im 3/3]
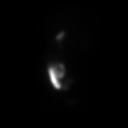
[im 3/3]
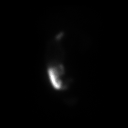
[im 3/3]
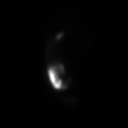
[im 3/3]
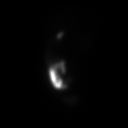
[im 3/3]
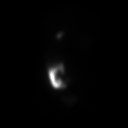

[18 of 18 positions shown; findings below may reference images not displayed]

FINDINGS: There is immediate homogeneous uptake of radiotracer in the liver.
Filling of the gallbladder begins at 35 minutes. Radiotracer uptake
is present in the small bowel at 30 minutes.

When gallbladder filling was complete, the patient was given PO
Ensure. Gallbladder ejection fraction: 52.7%. Normal gallbladder
ejection fraction with Ensure is greater than 33%. The patient did
experience symptoms after oral ingestion of Ensure.
IMPRESSION: 1. Normal hepatobiliary scan.
2. Normal gallbladder ejection fraction.

## 2016-07-31 ENCOUNTER — Encounter (HOSPITAL_BASED_OUTPATIENT_CLINIC_OR_DEPARTMENT_OTHER): Payer: Self-pay | Admitting: *Deleted

## 2016-08-03 ENCOUNTER — Encounter (HOSPITAL_BASED_OUTPATIENT_CLINIC_OR_DEPARTMENT_OTHER): Payer: Self-pay | Admitting: *Deleted

## 2016-08-03 NOTE — Progress Notes (Signed)
NPO AFTER MN.  ARRIVE AT 0600.  NEEDS URINE PREG.  GETTING LAB WORK DONE Friday 08-04-2016 (CBC, CMET).  WILL TAKE CYMBALTA AND PROBIOTIC AM DOS W/ SIPS OF WATER.  IF NEEDED TAKE ZOFRAN/ VICODIN.

## 2016-08-06 NOTE — H&P (Signed)
Casey Cox is a 41 y.o. female with known endometriosis diagnosed by myself on laparoscopy 05/2013; now suffers from recurrent pelvic pain.  She received Mirena IUD 01/2014 and did well initially with this suppression.  Currently, her pain has returned and is greatest in the right lower quadrant.  She declines Lupron secondary to anticipated side effects.    Pertinent Gynecological History: Menses: with severe dysmenorrhea Bleeding: dysfunctional uterine bleeding Contraception: IUD DES exposure: unknown Blood transfusions: none Sexually transmitted diseases: no past history Previous GYN Procedures: Dx L/S  Last mammogram: normal Date: 09/2014 Last pap: normal Date: 06/2016 OB History: G0   Menstrual History: Menarche age: n/a No LMP recorded. Patient is not currently having periods (Reason: IUD).    Past Medical History:  Diagnosis Date  . Acne    sees dermatologist  . Anxiety and depression   . Ehlers-Danlos syndrome    per pt dx with her rheuamtologist  (joint hypermobility, connective tissue disorder)  . Endometriosis   . Fibromyalgia   . History of concussion    2009-- no residual  . History of melasma    skin depigmentation  . Interstitial cystitis   . Migraine   . Osteoarthritis    jaw, left ankle, knees  . Panic disorder    hx pain attacks  . PCOS (polycystic ovarian syndrome)   . Pelvic pain in female   . Seasonal allergies   . Subclinical hyperthyroidism    sees endocrinologist--- DR Cruzita Lederer  . TMJ syndrome     Past Surgical History:  Procedure Laterality Date  . ANKLE RECONSTRUCTION Left 04/08/2014   Procedure: RECONSTRUCTION LEFT ANKLE, REPAIR SECONDARY DISRUPTED LIGAMENT ANKLE COLLATERAL, TRANSPLANTERIOR/TRANSFER TENDON TIBIAL EXTENSOR INTO MIDFOOT DEEP;  Surgeon: Yvette Rack., MD;  Location: [facility];  Service: Orthopedics;  Laterality: Left;  . ARTHROTOMY Bilateral 12/23/2013   Procedure: BILATERAL ARTHROTOMY, MENISECTOMY,  TEMPORALIS FASCIA GRAFT;  Surgeon: Gae Bon, DDS;  Location: [facility];  Service: Oral Surgery;  Laterality: Bilateral;   TMJ  . LAPAROSCOPY N/A 06/24/2013   Procedure: LAPAROSCOPY OPERATIVE WITH CHROMOPERTUBATION, LYSIS OF ADHESIONS AND APPENDECTOMY ;  Surgeon: Linda Hedges, DO;  Location: [facility];  Service: Gynecology;  Laterality: N/A;  . LIGAMENT REPAIR Right 01/13/2014   Procedure: RIGHT ANKLE REPAIR SECONDAY DISRUPTED LIGAMENT ANKLE  COLLATERAL WITH PERONEAL TENDON AUTOGRAFT;  Surgeon: Yvette Rack., MD;  Location: [facility];  Service: Orthopedics;  Laterality: Right;  . TMJ ARTHROCENTESIS Bilateral 07-04-2012   w/  IV Sedation in office  . WISDOM TOOTH EXTRACTION  2004    Family History  Problem Relation Age of Onset  . Fibromyalgia Mother   . Arthritis Mother   . Cancer Mother     SKIN CA  . Diabetes Father   . Arthritis Father   . Mental illness Maternal Grandmother   . Cancer Maternal Grandfather     PROSTATE CA  . Diabetes Maternal Grandfather   . Diabetes Paternal Grandmother   . Hyperlipidemia Paternal Grandmother   . Hypertension Paternal Grandmother   . Hypertension Paternal Grandfather   . Arthritis Paternal Grandfather   . Heart disease Paternal Grandfather     Social History:  reports that she has never smoked. She has never used smokeless tobacco. She reports that she drinks alcohol. She reports that she does not use drugs.  Allergies:  Allergies  Allergen Reactions  . Amoxicillin Rash  . Nickel Rash    No prescriptions prior to admission.  ROS  Height 5\' 3"  (1.6 m), weight 160 lb (72.6 kg). Physical Exam  Constitutional: She is oriented to person, place, and time. She appears well-developed and well-nourished.  GI: Soft. There is no rebound and no guarding.  Neurological: She is alert and oriented to person, place, and time.  Skin: Skin is warm and dry.  Psychiatric: She has a normal mood and affect. Her behavior is normal.     No results found for this or any previous visit (from the past 24 hour(s)).  No results found.  Assessment/Plan: 41yo with endometriosis, recurrent pelvic pain -Dx L/S, possible removal of endometriosis -Pt is counseled re: risk of bleeding, infection, scarring and damage to surrounding structures.  All questions were answered and the patient wishes to proceed.  Drema Eddington, [place] 08/06/2016, 4:50 PM

## 2016-08-07 ENCOUNTER — Encounter (HOSPITAL_BASED_OUTPATIENT_CLINIC_OR_DEPARTMENT_OTHER): Payer: Self-pay

## 2016-08-07 ENCOUNTER — Ambulatory Visit (HOSPITAL_BASED_OUTPATIENT_CLINIC_OR_DEPARTMENT_OTHER): Payer: Commercial Managed Care - PPO | Admitting: Anesthesiology

## 2016-08-07 ENCOUNTER — Encounter (HOSPITAL_BASED_OUTPATIENT_CLINIC_OR_DEPARTMENT_OTHER)
Admission: RE | Disposition: A | Discharge status: Home / Self Care | Payer: Self-pay | Source: Ambulatory Visit | Attending: Obstetrics & Gynecology

## 2016-08-07 ENCOUNTER — Ambulatory Visit (HOSPITAL_BASED_OUTPATIENT_CLINIC_OR_DEPARTMENT_OTHER)
Admission: RE | Admit: 2016-08-07 | Discharge: 2016-08-07 | Disposition: A | Discharge status: Home / Self Care | Payer: Commercial Managed Care - PPO | Source: Ambulatory Visit | Attending: Obstetrics & Gynecology | Admitting: Obstetrics & Gynecology

## 2016-08-07 DIAGNOSIS — M797 Fibromyalgia: Secondary | ICD-10-CM | POA: Diagnosis not present

## 2016-08-07 DIAGNOSIS — N858 Other specified noninflammatory disorders of uterus: Secondary | ICD-10-CM | POA: Diagnosis not present

## 2016-08-07 DIAGNOSIS — Z8042 Family history of malignant neoplasm of prostate: Secondary | ICD-10-CM | POA: Insufficient documentation

## 2016-08-07 DIAGNOSIS — F329 Major depressive disorder, single episode, unspecified: Secondary | ICD-10-CM | POA: Insufficient documentation

## 2016-08-07 DIAGNOSIS — Z87898 Personal history of other specified conditions: Secondary | ICD-10-CM | POA: Insufficient documentation

## 2016-08-07 DIAGNOSIS — Z833 Family history of diabetes mellitus: Secondary | ICD-10-CM | POA: Insufficient documentation

## 2016-08-07 DIAGNOSIS — R102 Pelvic and perineal pain: Secondary | ICD-10-CM | POA: Insufficient documentation

## 2016-08-07 DIAGNOSIS — N801 Endometriosis of ovary: Secondary | ICD-10-CM | POA: Insufficient documentation

## 2016-08-07 DIAGNOSIS — M1991 Primary osteoarthritis, unspecified site: Secondary | ICD-10-CM | POA: Insufficient documentation

## 2016-08-07 DIAGNOSIS — Z8249 Family history of ischemic heart disease and other diseases of the circulatory system: Secondary | ICD-10-CM | POA: Diagnosis not present

## 2016-08-07 DIAGNOSIS — N3010 Interstitial cystitis (chronic) without hematuria: Secondary | ICD-10-CM | POA: Insufficient documentation

## 2016-08-07 DIAGNOSIS — Z8261 Family history of arthritis: Secondary | ICD-10-CM | POA: Diagnosis not present

## 2016-08-07 DIAGNOSIS — M26609 Unspecified temporomandibular joint disorder, unspecified side: Secondary | ICD-10-CM | POA: Insufficient documentation

## 2016-08-07 DIAGNOSIS — G43909 Migraine, unspecified, not intractable, without status migrainosus: Secondary | ICD-10-CM | POA: Insufficient documentation

## 2016-08-07 DIAGNOSIS — Z881 Allergy status to other antibiotic agents status: Secondary | ICD-10-CM | POA: Diagnosis not present

## 2016-08-07 DIAGNOSIS — E0590 Thyrotoxicosis, unspecified without thyrotoxic crisis or storm: Secondary | ICD-10-CM | POA: Diagnosis not present

## 2016-08-07 DIAGNOSIS — Z9109 Other allergy status, other than to drugs and biological substances: Secondary | ICD-10-CM | POA: Insufficient documentation

## 2016-08-07 DIAGNOSIS — M170 Bilateral primary osteoarthritis of knee: Secondary | ICD-10-CM | POA: Diagnosis not present

## 2016-08-07 DIAGNOSIS — Z808 Family history of malignant neoplasm of other organs or systems: Secondary | ICD-10-CM | POA: Diagnosis not present

## 2016-08-07 DIAGNOSIS — N809 Endometriosis, unspecified: Secondary | ICD-10-CM | POA: Diagnosis present

## 2016-08-07 DIAGNOSIS — F419 Anxiety disorder, unspecified: Secondary | ICD-10-CM | POA: Insufficient documentation

## 2016-08-07 DIAGNOSIS — L709 Acne, unspecified: Secondary | ICD-10-CM | POA: Insufficient documentation

## 2016-08-07 DIAGNOSIS — G8929 Other chronic pain: Secondary | ICD-10-CM | POA: Insufficient documentation

## 2016-08-07 DIAGNOSIS — Q796 Ehlers-Danlos syndrome: Secondary | ICD-10-CM | POA: Insufficient documentation

## 2016-08-07 DIAGNOSIS — M19072 Primary osteoarthritis, left ankle and foot: Secondary | ICD-10-CM | POA: Insufficient documentation

## 2016-08-07 DIAGNOSIS — F410 Panic disorder [episodic paroxysmal anxiety] without agoraphobia: Secondary | ICD-10-CM | POA: Diagnosis not present

## 2016-08-07 HISTORY — DX: Other seasonal allergic rhinitis: J30.2

## 2016-08-07 HISTORY — DX: Polycystic ovarian syndrome: E28.2

## 2016-08-07 HISTORY — DX: Panic disorder (episodic paroxysmal anxiety): F41.0

## 2016-08-07 HISTORY — DX: Personal history of traumatic brain injury: Z87.820

## 2016-08-07 HISTORY — PX: LAPAROSCOPY: SHX197

## 2016-08-07 LAB — COMPREHENSIVE METABOLIC PANEL
ALT: 12 U/L — ABNORMAL LOW (ref 14–54)
ANION GAP: 7 (ref 5–15)
AST: 18 U/L (ref 15–41)
Albumin: 4.2 g/dL (ref 3.5–5.0)
Alkaline Phosphatase: 63 U/L (ref 38–126)
BUN: 14 mg/dL (ref 6–20)
CHLORIDE: 102 mmol/L (ref 101–111)
CO2: 27 mmol/L (ref 22–32)
CREATININE: 0.79 mg/dL (ref 0.44–1.00)
Calcium: 9.7 mg/dL (ref 8.9–10.3)
Glucose, Bld: 99 mg/dL (ref 65–99)
Potassium: 3.7 mmol/L (ref 3.5–5.1)
SODIUM: 136 mmol/L (ref 135–145)
Total Bilirubin: 0.7 mg/dL (ref 0.3–1.2)
Total Protein: 7.5 g/dL (ref 6.5–8.1)

## 2016-08-07 LAB — CBC
HCT: 37.4 % (ref 36.0–46.0)
Hemoglobin: 13.5 g/dL (ref 12.0–15.0)
MCH: 31.3 pg (ref 26.0–34.0)
MCHC: 36.1 g/dL — AB (ref 30.0–36.0)
MCV: 86.6 fL (ref 78.0–100.0)
PLATELETS: 307 10*3/uL (ref 150–400)
RBC: 4.32 MIL/uL (ref 3.87–5.11)
RDW: 11.8 % (ref 11.5–15.5)
WBC: 6.6 10*3/uL (ref 4.0–10.5)

## 2016-08-07 LAB — POCT PREGNANCY, URINE: PREG TEST UR: NEGATIVE

## 2016-08-07 SURGERY — LAPAROSCOPY OPERATIVE
Anesthesia: General | Site: Abdomen

## 2016-08-07 MED ORDER — ROCURONIUM BROMIDE 50 MG/5ML IV SOSY
PREFILLED_SYRINGE | INTRAVENOUS | Status: AC
  Filled 2016-08-07: qty 5

## 2016-08-07 MED ORDER — OXYCODONE-ACETAMINOPHEN 5-325 MG PO TABS: 1.0000 | ORAL_TABLET | Freq: Four times a day (QID) | ORAL | 0 refills | Status: DC | PRN

## 2016-08-07 MED ORDER — PROMETHAZINE HCL 25 MG/ML IJ SOLN
6.2500 mg | INTRAMUSCULAR | Status: DC | PRN
  Filled 2016-08-07: qty 1

## 2016-08-07 MED ORDER — ROCURONIUM BROMIDE 10 MG/ML (PF) SYRINGE
PREFILLED_SYRINGE | INTRAVENOUS | Status: DC | PRN
  Administered 2016-08-07: 25 mg via INTRAVENOUS

## 2016-08-07 MED ORDER — ONDANSETRON HCL 4 MG/2ML IJ SOLN
INTRAMUSCULAR | Status: AC
  Filled 2016-08-07: qty 2

## 2016-08-07 MED ORDER — HYDROMORPHONE HCL 1 MG/ML IJ SOLN
0.2500 mg | INTRAMUSCULAR | Status: DC | PRN
  Administered 2016-08-07 (×2): 0.5 mg via INTRAVENOUS
  Filled 2016-08-07: qty 0.5

## 2016-08-07 MED ORDER — ONDANSETRON HCL 4 MG PO TABS: 8.0000 mg | ORAL_TABLET | Freq: Three times a day (TID) | ORAL | 1 refills | Status: DC | PRN

## 2016-08-07 MED ORDER — OXYCODONE-ACETAMINOPHEN 5-325 MG PO TABS
1.0000 | ORAL_TABLET | Freq: Once | ORAL | Status: AC
  Administered 2016-08-07: 1 via ORAL
  Filled 2016-08-07: qty 1

## 2016-08-07 MED ORDER — FENTANYL CITRATE (PF) 100 MCG/2ML IJ SOLN
INTRAMUSCULAR | Status: AC
  Filled 2016-08-07: qty 2

## 2016-08-07 MED ORDER — KETOROLAC TROMETHAMINE 30 MG/ML IJ SOLN
INTRAMUSCULAR | Status: AC
  Filled 2016-08-07: qty 1

## 2016-08-07 MED ORDER — BUPIVACAINE HCL (PF) 0.25 % IJ SOLN
INTRAMUSCULAR | Status: DC | PRN
  Administered 2016-08-07: 4 mL

## 2016-08-07 MED ORDER — DEXAMETHASONE SODIUM PHOSPHATE 4 MG/ML IJ SOLN
INTRAMUSCULAR | Status: DC | PRN
  Administered 2016-08-07: 10 mg via INTRAVENOUS

## 2016-08-07 MED ORDER — LACTATED RINGERS IV SOLN
INTRAVENOUS | Status: DC
  Filled 2016-08-07: qty 1000

## 2016-08-07 MED ORDER — LACTATED RINGERS IV SOLN
INTRAVENOUS | Status: DC
  Administered 2016-08-07 (×2): via INTRAVENOUS
  Filled 2016-08-07: qty 1000

## 2016-08-07 MED ORDER — MIDAZOLAM HCL 5 MG/5ML IJ SOLN
INTRAMUSCULAR | Status: DC | PRN
  Administered 2016-08-07: 2 mg via INTRAVENOUS

## 2016-08-07 MED ORDER — DEXAMETHASONE SODIUM PHOSPHATE 10 MG/ML IJ SOLN
INTRAMUSCULAR | Status: AC
  Filled 2016-08-07: qty 1

## 2016-08-07 MED ORDER — MEPERIDINE HCL 25 MG/ML IJ SOLN
6.2500 mg | INTRAMUSCULAR | Status: DC | PRN
  Filled 2016-08-07: qty 1

## 2016-08-07 MED ORDER — LIDOCAINE 2% (20 MG/ML) 5 ML SYRINGE
INTRAMUSCULAR | Status: DC | PRN
  Administered 2016-08-07: 80 mg via INTRAVENOUS

## 2016-08-07 MED ORDER — SCOPOLAMINE 1 MG/3DAYS TD PT72
MEDICATED_PATCH | TRANSDERMAL | Status: AC
  Filled 2016-08-07: qty 1

## 2016-08-07 MED ORDER — OXYCODONE-ACETAMINOPHEN 5-325 MG PO TABS
ORAL_TABLET | ORAL | Status: AC
  Filled 2016-08-07: qty 1

## 2016-08-07 MED ORDER — PROPOFOL 10 MG/ML IV BOLUS
INTRAVENOUS | Status: AC
  Filled 2016-08-07: qty 20

## 2016-08-07 MED ORDER — IBUPROFEN 800 MG PO TABS: 800.0000 mg | ORAL_TABLET | Freq: Four times a day (QID) | ORAL | 1 refills | Status: DC | PRN

## 2016-08-07 MED ORDER — SCOPOLAMINE 1 MG/3DAYS TD PT72
1.0000 | MEDICATED_PATCH | TRANSDERMAL | Status: DC
  Administered 2016-08-07: 1.5 mg via TRANSDERMAL
  Filled 2016-08-07: qty 1

## 2016-08-07 MED ORDER — SUCCINYLCHOLINE CHLORIDE 200 MG/10ML IV SOSY
PREFILLED_SYRINGE | INTRAVENOUS | Status: DC | PRN
  Administered 2016-08-07: 100 mg via INTRAVENOUS

## 2016-08-07 MED ORDER — LIDOCAINE 2% (20 MG/ML) 5 ML SYRINGE
INTRAMUSCULAR | Status: AC
  Filled 2016-08-07: qty 5

## 2016-08-07 MED ORDER — MIDAZOLAM HCL 2 MG/2ML IJ SOLN
0.5000 mg | Freq: Once | INTRAMUSCULAR | Status: DC | PRN
  Filled 2016-08-07: qty 2

## 2016-08-07 MED ORDER — ARTIFICIAL TEARS OP OINT
TOPICAL_OINTMENT | OPHTHALMIC | Status: AC
  Filled 2016-08-07: qty 3.5

## 2016-08-07 MED ORDER — MIDAZOLAM HCL 2 MG/2ML IJ SOLN
INTRAMUSCULAR | Status: AC
  Filled 2016-08-07: qty 2

## 2016-08-07 MED ORDER — PROPOFOL 10 MG/ML IV BOLUS
INTRAVENOUS | Status: DC | PRN
  Administered 2016-08-07: 200 mg via INTRAVENOUS

## 2016-08-07 MED ORDER — HYDROMORPHONE HCL 2 MG/ML IJ SOLN
INTRAMUSCULAR | Status: AC
  Filled 2016-08-07: qty 1

## 2016-08-07 MED ORDER — KETOROLAC TROMETHAMINE 30 MG/ML IJ SOLN
INTRAMUSCULAR | Status: DC | PRN
  Administered 2016-08-07: 30 mg via INTRAVENOUS

## 2016-08-07 MED ORDER — SUGAMMADEX SODIUM 200 MG/2ML IV SOLN
INTRAVENOUS | Status: DC | PRN
  Administered 2016-08-07: 200 mg via INTRAVENOUS

## 2016-08-07 MED ORDER — ONDANSETRON HCL 4 MG/2ML IJ SOLN
INTRAMUSCULAR | Status: DC | PRN
  Administered 2016-08-07: 4 mg via INTRAVENOUS

## 2016-08-07 MED ORDER — FENTANYL CITRATE (PF) 100 MCG/2ML IJ SOLN
INTRAMUSCULAR | Status: DC | PRN
Start: 2016-08-07 — End: 2016-08-07
  Administered 2016-08-07: 50 ug via INTRAVENOUS
  Administered 2016-08-07 (×2): 25 ug via INTRAVENOUS
  Administered 2016-08-07 (×3): 50 ug via INTRAVENOUS
  Administered 2016-08-07 (×2): 25 ug via INTRAVENOUS

## 2016-08-07 MED ORDER — SUCCINYLCHOLINE CHLORIDE 200 MG/10ML IV SOSY
PREFILLED_SYRINGE | INTRAVENOUS | Status: AC
  Filled 2016-08-07: qty 10

## 2016-08-07 MED ORDER — SUGAMMADEX SODIUM 200 MG/2ML IV SOLN
INTRAVENOUS | Status: AC
  Filled 2016-08-07: qty 2

## 2016-08-07 SURGICAL SUPPLY — 51 items
BARRIER ADHS 3X4 INTERCEED (GAUZE/BANDAGES/DRESSINGS) IMPLANT
BENZOIN TINCTURE PRP APPL 2/3 (GAUZE/BANDAGES/DRESSINGS) IMPLANT
BLADE SURG 11 STRL SS (BLADE) ×2 IMPLANT
CANISTER SUCT 3000ML PPV (MISCELLANEOUS) IMPLANT
CANISTER SUCTION 1200CC (MISCELLANEOUS) IMPLANT
CATH ROBINSON RED A/P 14FR (CATHETERS) ×2 IMPLANT
CHLORAPREP W/TINT 26ML (MISCELLANEOUS) ×2 IMPLANT
COVER MAYO STAND STRL (DRAPES) ×2 IMPLANT
DERMABOND ADVANCED (GAUZE/BANDAGES/DRESSINGS) ×1
DERMABOND ADVANCED .7 DNX12 (GAUZE/BANDAGES/DRESSINGS) ×1 IMPLANT
DRAPE UNDERBUTTOCKS STRL (DRAPE) ×2 IMPLANT
ELECT REM PT RETURN 9FT ADLT (ELECTROSURGICAL) ×2
ELECTRODE REM PT RTRN 9FT ADLT (ELECTROSURGICAL) ×1 IMPLANT
GLOVE BIO SURGEON STRL SZ 6 (GLOVE) ×2 IMPLANT
GLOVE BIOGEL PI IND STRL 6 (GLOVE) ×2 IMPLANT
GLOVE BIOGEL PI IND STRL 7.5 (GLOVE) ×3 IMPLANT
GLOVE BIOGEL PI INDICATOR 6 (GLOVE) ×2
GLOVE BIOGEL PI INDICATOR 7.5 (GLOVE) ×3
GOWN STRL REUS W/ TWL LRG LVL3 (GOWN DISPOSABLE) ×1 IMPLANT
GOWN STRL REUS W/TWL LRG LVL3 (GOWN DISPOSABLE) ×3 IMPLANT
KIT RM TURNOVER CYSTO AR (KITS) ×2 IMPLANT
MANIFOLD NEPTUNE II (INSTRUMENTS) IMPLANT
NEEDLE HYPO 25X1 1.5 SAFETY (NEEDLE) ×2 IMPLANT
NEEDLE INSUFFLATION 120MM (ENDOMECHANICALS) ×2 IMPLANT
NS IRRIG 500ML POUR BTL (IV SOLUTION) ×4 IMPLANT
PACK BASIN DAY SURGERY FS (CUSTOM PROCEDURE TRAY) ×2 IMPLANT
PACK LAPAROSCOPY II (CUSTOM PROCEDURE TRAY) ×2 IMPLANT
PAD OB MATERNITY 4.3X12.25 (PERSONAL CARE ITEMS) ×2 IMPLANT
PADDING ION DISPOSABLE (MISCELLANEOUS) ×2 IMPLANT
PENCIL BUTTON HOLSTER BLD 10FT (ELECTRODE) IMPLANT
POUCH SPECIMEN RETRIEVAL 10MM (ENDOMECHANICALS) IMPLANT
SCISSORS LAP 5X35 DISP (ENDOMECHANICALS) IMPLANT
SEALER TISSUE G2 CVD JAW 35 (ENDOMECHANICALS) IMPLANT
SEALER TISSUE G2 CVD JAW 45CM (ENDOMECHANICALS) IMPLANT
SET IRRIG TUBING LAPAROSCOPIC (IRRIGATION / IRRIGATOR) IMPLANT
SOLUTION ANTI FOG 6CC (MISCELLANEOUS) ×2 IMPLANT
SPONGE LAP 4X18 X RAY DECT (DISPOSABLE) IMPLANT
STRIP CLOSURE SKIN 1/2X4 (GAUZE/BANDAGES/DRESSINGS) IMPLANT
STRIP CLOSURE SKIN 1/4X4 (GAUZE/BANDAGES/DRESSINGS) IMPLANT
SUT MNCRL AB 3-0 PS2 18 (SUTURE) ×2 IMPLANT
SUT VICRYL 0 UR6 27IN ABS (SUTURE) ×2 IMPLANT
SYR CONTROL 10ML LL (SYRINGE) ×2 IMPLANT
SYRINGE 10CC LL (SYRINGE) ×2 IMPLANT
TOWEL OR 17X24 6PK STRL BLUE (TOWEL DISPOSABLE) ×4 IMPLANT
TRAY DSU PREP LF (CUSTOM PROCEDURE TRAY) ×2 IMPLANT
TROCAR XCEL NON-BLD 11X100MML (ENDOMECHANICALS) ×2 IMPLANT
TROCAR XCEL NON-BLD 5MMX100MML (ENDOMECHANICALS) ×4 IMPLANT
TUBE CONNECTING 12X1/4 (SUCTIONS) IMPLANT
TUBING INSUF HEATED (TUBING) ×2 IMPLANT
WARMER LAPAROSCOPE (MISCELLANEOUS) ×2 IMPLANT
WATER STERILE IRR 500ML POUR (IV SOLUTION) IMPLANT

## 2016-08-07 NOTE — Op Note (Signed)
  Casey Cox 08/07/2016  PREOPERATIVE DIAGNOSIS:  Recurrent pelvic pain, endometriosis  POSTOPERATIVE DIAGNOSIS:  SAA  PROCEDURE:  Diagnostic laparoscopy, fulguration of endometriosis  ANESTHESIA:  General endotracheal  COMPLICATIONS:  None immediate.  ESTIMATED BLOOD LOSS:  Less than 20 ml.  INDICATIONS: 41 y.o. with laparoscopy proved endometriosis with return of chronic pelvic pain despite Mirena IUD.     FINDINGS:  Normal liver edge and gallbladder.  Normal bladder peritoneum. Normal uterus, normal bilateral fallopian tubes.  Single hemosiderin lesion on each ovary.  Multiple clear vesicular lesions in posterior culdesac.  Extremely thickened right uterosacral ligament.  Few clear vesicular lesions in bilateral ovarian fossae.   TECHNIQUE:  The patient was taken to the operating room where general anesthesia was obtained without difficulty.  She was then placed in the dorsal lithotomy position and prepared and draped in sterile fashion.  After an adequate timeout was performed, a bivalved speculum was then placed in the patient's vagina, and the anterior lip of cervix grasped with the single-tooth tenaculum. IUD strings were noted. The hulka clip was advanced into the uterus.  The speculum was removed from the vagina.  Attention was then turned to the patient's abdomen where a 10-mm skin incision was made on the umbilical fold.  The Veress needle was carefully introduced into the peritoneal cavity through the abdominal wall.  Intraperitoneal placement was confirmed by drop in intraabdominal pressure with insufflation of carbon dioxide gas.  Adequate pneumoperitoneum was obtained, and the 10 mm trocar was then advanced without difficulty into the abdomen where intraabdominal placement was confirmed by the operative laparoscope.  5 mm right and left lower quadrant trocars were placed under direct visualization with the laparoscope.  Using the monopolar shears, the above listed findings  were fulgurated.  After all lesions were destroyed, the instruments were all removed from the abdomen.  The infraumbilical incision was closed with 0 vicryl in a figure of eight stitch.  All three skin incisions were closed using 3-0 monocryl in a subcuticular stitch.  Dermabond was applied to all three incisions.    The uterine manipulator was removed from the vagina without complications. The patient tolerated the procedure well.  Sponge, lap, and needle counts were correct times two.  The patient was then taken to the recovery room awake, extubated and in stable  in stable condition.

## 2016-08-07 NOTE — Transfer of Care (Signed)
Immediate Anesthesia Transfer of Care Note  Patient: Casey Cox  Procedure(s) Performed: Procedure(s) (LRB): LAPAROSCOPY OPERATIVE, FULGERATION  OF ENDOMETRIOSIS  (N/A)  Patient Location: PACU  Anesthesia Type: General  Level of Consciousness: awake, oriented, sedated and patient cooperative  Airway & Oxygen Therapy: Patient Spontanous Breathing and Patient connected to face mask oxygen  Post-op Assessment: Report given to PACU RN and Post -op Vital signs reviewed and stable  Post vital signs: Reviewed and stable  Complications: No apparent anesthesia complications Last Vitals:  Vitals:   08/07/16 0606 08/07/16 0840  BP: 113/70 (!) 124/42  Pulse: 94 94  Resp: 16 16  Temp: 36.6 C 36.8 C

## 2016-08-07 NOTE — Discharge Instructions (Signed)
°  Post Anesthesia Home Care Instructions  Activity: Get plenty of rest for the remainder of the day. A responsible adult should stay with you for 24 hours following the procedure.  For the next 24 hours, DO NOT: -Drive a car -Paediatric nurse -Drink alcoholic beverages -Take any medication unless instructed by your physician -Make any legal decisions or sign important papers.  Meals: Start with liquid foods such as gelatin or soup. Progress to regular foods as tolerated. Avoid greasy, spicy, heavy foods. If nausea and/or vomiting occur, drink only clear liquids until the nausea and/or vomiting subsides. Call your physician if vomiting continues.  Special Instructions/Symptoms: Your throat may feel dry or sore from the anesthesia or the breathing tube placed in your throat during surgery. If this causes discomfort, gargle with warm salt water. The discomfort should disappear within 24 hours.  If you had a scopolamine patch placed behind your ear for the management of post- operative nausea and/or vomiting:  1. The medication in the patch is effective for 72 hours, after which it should be removed.  Wrap patch in a tissue and discard in the trash. Wash hands thoroughly with soap and water. 2. You may remove the patch earlier than 72 hours if you experience unpleasant side effects which may include dry mouth, dizziness or visual disturbances. 3. Avoid touching the patch. Wash your hands with soap and water after contact with the patch.   Call MD for T>100.4, heavy vaginal bleeding, severe abdominal pain, intractable nausea and/or vomiting, or respiratory distress.  Call office to schedule postop appointment in 2 weeks.  No driving while taking narcotics.  Pelvic rest x 4 weeks. HOME CARE INSTRUCTIONS - LAPAROSCOPY  Wound Care: The bandaids or dressing which are placed over the skin openings may be removed the day after surgery. The incision should be kept clean and dry. The stitches do not  need to be removed. Should the incision become sore, red, and swollen after the first week, check with your doctor.  Personal Hygiene: Shower the day after your procedure. Always wipe from front to back after elimination.   Activity: Do not drive or operate any equipment today. The effects of the anesthesia are still present and drowsiness may result. Rest today, not necessarily flat bed rest, just take it easy. You may resume your normal activity in one to three days or as instructed by your physician.  Sexual Activity: You resume sexual activity as indicated by your physician_________. If your laparoscopy was for a sterilization ( tubes tied ), continue current method of birth control until after your next period or ask for specific instructions from your doctor.  Diet: Eat a light diet as desired this evening. You may resume a regular diet tomorrow.  Return to Work: Two to three days or as indicated by your doctor.  Expectations After Surgery: Your surgery will cause vaginal drainage or spotting which may continue for 2-3 days. Mild abdominal discomfort or tenderness is not unusual and some shoulder pain may also be noted which can be relieved by lying flat in pain.  Call Your Doctor If these Occur:  Persistent or heavy bleeding at incision site       Redness or swelling around incision       Elevation of temperature greater than 100 degrees F

## 2016-08-07 NOTE — Anesthesia Procedure Notes (Signed)
Procedure Name: Intubation Date/Time: 08/07/2016 7:36 AM Performed by: Denna Haggard D Pre-anesthesia Checklist: Patient identified, Emergency Drugs available, Suction available and Patient being monitored Patient Re-evaluated:Patient Re-evaluated prior to inductionOxygen Delivery Method: Circle system utilized Preoxygenation: Pre-oxygenation with 100% oxygen Intubation Type: IV induction Ventilation: Mask ventilation without difficulty Laryngoscope Size: Mac and 3 Grade View: Grade I Tube type: Oral Tube size: 7.5 mm Number of attempts: 1 Airway Equipment and Method: Stylet and Oral airway Placement Confirmation: ETT inserted through vocal cords under direct vision,  positive ETCO2 and breath sounds checked- equal and bilateral Secured at: 21 cm Tube secured with: Tape Dental Injury: Teeth and Oropharynx as per pre-operative assessment

## 2016-08-07 NOTE — Anesthesia Preprocedure Evaluation (Addendum)
Anesthesia Evaluation  Patient identified by MRN, date of birth, ID band Patient awake    Reviewed: Allergy & Precautions, NPO status , Patient's Chart, lab work & pertinent test results  History of Anesthesia Complications Negative for: history of anesthetic complications  Airway Mallampati: II  TM Distance: >3 FB Neck ROM: Full    Dental  (+) Dental Advisory Given, Teeth Intact   Pulmonary neg pulmonary ROS,    breath sounds clear to auscultation       Cardiovascular (-) anginanegative cardio ROS   Rhythm:Regular Rate:Normal     Neuro/Psych  Headaches, Anxiety    GI/Hepatic Neg liver ROS, GERD  Poorly Controlled,  Endo/Other  Ehlers-Danlos polycystic ovarian  Renal/GU negative Renal ROS     Musculoskeletal  (+) Fibromyalgia -  Abdominal   Peds  Hematology negative hematology ROS (+)   Anesthesia Other Findings   Reproductive/Obstetrics                            Anesthesia Physical Anesthesia Plan  ASA: III  Anesthesia Plan: General   Post-op Pain Management:    Induction: Intravenous and Rapid sequence  Airway Management Planned: Oral ETT  Additional Equipment:   Intra-op Plan:   Post-operative Plan: Extubation in OR  Informed Consent: I have reviewed the patients History and Physical, chart, labs and discussed the procedure including the risks, benefits and alternatives for the proposed anesthesia with the patient or authorized representative who has indicated his/her understanding and acceptance.   Dental advisory given  Plan Discussed with: CRNA and Surgeon  Anesthesia Plan Comments: (Plan routine monitors, GETA)        Anesthesia Quick Evaluation

## 2016-08-07 NOTE — Anesthesia Postprocedure Evaluation (Signed)
Anesthesia Post Note  Patient: Casey Cox  Procedure(s) Performed: Procedure(s) (LRB): LAPAROSCOPY OPERATIVE, FULGERATION  OF ENDOMETRIOSIS  (N/A)  Patient location during evaluation: PACU Anesthesia Type: General Level of consciousness: awake and alert, oriented and patient cooperative Pain management: pain level controlled Vital Signs Assessment: post-procedure vital signs reviewed and stable Respiratory status: spontaneous breathing, nonlabored ventilation and respiratory function stable Cardiovascular status: blood pressure returned to baseline and stable Postop Assessment: no signs of nausea or vomiting Anesthetic complications: no       Last Vitals:  Vitals:   08/07/16 0915 08/07/16 0930  BP: (!) 111/54 101/64  Pulse: 80 79  Resp: (!) 1 12  Temp:      Last Pain:  Vitals:   08/07/16 0930  TempSrc:   PainSc: 6                  Shivan Hodes,E. Abb Gobert

## 2016-08-07 NOTE — Progress Notes (Signed)
No change to H&P.  Commodore Bellew, DO 

## 2016-08-08 ENCOUNTER — Encounter (HOSPITAL_BASED_OUTPATIENT_CLINIC_OR_DEPARTMENT_OTHER): Payer: Self-pay | Admitting: Obstetrics & Gynecology

## 2017-01-03 ENCOUNTER — Ambulatory Visit: Payer: Commercial Managed Care - PPO | Attending: Obstetrics & Gynecology | Admitting: Physical Therapy

## 2017-01-03 DIAGNOSIS — M62838 Other muscle spasm: Secondary | ICD-10-CM | POA: Diagnosis not present

## 2017-01-03 DIAGNOSIS — M6281 Muscle weakness (generalized): Secondary | ICD-10-CM

## 2017-01-03 DIAGNOSIS — R279 Unspecified lack of coordination: Secondary | ICD-10-CM | POA: Diagnosis present

## 2017-01-04 ENCOUNTER — Encounter: Payer: Self-pay | Admitting: Physical Therapy

## 2017-01-04 NOTE — Therapy (Signed)
[facility] [address] [place], [place], [address] Phone: [phone]   Fax:  [phone]  Physical Therapy Evaluation  Patient Details  Name: Casey Cox MRN: [id number] Date of Birth: March 10, 1976 Referring Provider: Lorre Nick  Encounter Date: 01/03/2017      PT End of Session - 01/04/17 1000    Visit Number 1   Date for PT Re-Evaluation 04/26/17   PT Start Time 8938   PT Stop Time 1530   PT Time Calculation (min) 43 min   Activity Tolerance Patient limited by pain      Past Medical History:  Diagnosis Date  . Acne    sees dermatologist  . Anxiety and depression   . Ehlers-Danlos syndrome    per pt dx with her rheuamtologist  (joint hypermobility, connective tissue disorder)  . Endometriosis   . Fibromyalgia   . History of concussion    2009-- no residual  . History of melasma    skin depigmentation  . Interstitial cystitis   . Migraine   . Osteoarthritis    jaw, left ankle, knees  . Panic disorder    hx pain attacks  . PCOS (polycystic ovarian syndrome)   . Pelvic pain in female   . Seasonal allergies   . Subclinical hyperthyroidism    sees endocrinologist--- DR Cruzita Lederer  . TMJ syndrome     Past Surgical History:  Procedure Laterality Date  . ANKLE RECONSTRUCTION Left 04/08/2014   Procedure: RECONSTRUCTION LEFT ANKLE, REPAIR SECONDARY DISRUPTED LIGAMENT ANKLE COLLATERAL, TRANSPLANTERIOR/TRANSFER TENDON TIBIAL EXTENSOR INTO MIDFOOT DEEP;  Surgeon: Yvette Rack., MD;  Location: [facility];  Service: Orthopedics;  Laterality: Left;  . ARTHROTOMY Bilateral 12/23/2013   Procedure: BILATERAL ARTHROTOMY, MENISECTOMY, TEMPORALIS FASCIA GRAFT;  Surgeon: Gae Bon, DDS;  Location: [facility];  Service: Oral Surgery;  Laterality: Bilateral;   TMJ  . LAPAROSCOPY N/A 06/24/2013   Procedure: LAPAROSCOPY OPERATIVE WITH CHROMOPERTUBATION, LYSIS OF ADHESIONS AND APPENDECTOMY ;   Surgeon: Linda Hedges, DO;  Location: [facility];  Service: Gynecology;  Laterality: N/A;  . LAPAROSCOPY N/A 08/07/2016   Procedure: LAPAROSCOPY OPERATIVE, FULGERATION  OF ENDOMETRIOSIS ;  Surgeon: Linda Hedges, DO;  Location: [facility];  Service: Gynecology;  Laterality: N/A;  . LIGAMENT REPAIR Right 01/13/2014   Procedure: RIGHT ANKLE REPAIR SECONDAY DISRUPTED LIGAMENT ANKLE  COLLATERAL WITH PERONEAL TENDON AUTOGRAFT;  Surgeon: Yvette Rack., MD;  Location: [facility];  Service: Orthopedics;  Laterality: Right;  . TMJ ARTHROCENTESIS Bilateral 07-04-2012   w/  IV Sedation in office  . [place] EXTRACTION  2004    There were no vitals filed for this visit.       Subjective Assessment - 01/04/17 0950    Subjective Patient presents to clinic with pelvic pain that she is unsure of the exact cause due to complicated medical history as stated below.  Pt has constant pain that is exacerbated with intercourse, driving, sitting, stress, walking.  Pt has a friend who has been to pelvic floor PT and is hopeful that it will help at least a little. Pt reports she is getting another laproscopic procedure done next week.   Pertinent History fibromyalgia, endometriosis, interstitial cystitis, elhers danlos syndrome, multiple abdominal procedures, half of organs flipped in abdomen   Limitations Sitting;Lifting;Walking;Standing;Other (comment)  intercourse   Patient Stated Goals learn things to do at home, better knowledge of what is going on inside  Currently in Pain? 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 3   Has the patient had a decrease in activity level because of a fear of falling?  No   Is the patient reluctant to leave their home because of a fear of falling?  No     Home Ecologist residence   Living Arrangements Spouse/significant other     Prior Function   Level of Independence Independent   Vocation Full time employment   Vocation Requirements preschool teacher     Cognition   Overall Cognitive Status Within Functional Limits for tasks assessed     Observation/Other Assessments   Focus on Therapeutic Outcomes (FOTO)  PFDI 100% limited     Posture/Postural Control   Posture/Postural Control Postural limitations   Postural Limitations Rounded Shoulders     ROM / Strength   AROM / PROM / Strength PROM;Strength     PROM   Overall PROM Comments hypermobile bilateral hip  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        Pelvic Floor Special Questions - 01/04/17 0001    Are you Pregnant or attempting pregnancy? No   Prior Pregnancies No   Currently Sexually Active No  would like to be   Marinoff Scale pain prevents any attempts at intercourse   Urinary Leakage No   Urinary urgency Yes   feels like I have to pee all the time   Urinary frequency yes   Fecal incontinence No  mild IBS diarrhea/constipation   Falling out feeling (prolapse) Yes   Activities that cause feeling of prolapse just feels like it all the time, but no prolapse present   Skin Integrity Erthema   Perineal Body/Introitus  Elevated   Prolapse None   Pelvic Floor Internal Exam pt informed and consent given to perform internal assessment of soft tissue   Exam Type Vaginal   Sensation WNL   Palpation tender to transverse peroneus, obdurator, bulbospongeosis, fascial restriction around urethra   Strength fair squeeze, definite lift   Strength # of  reps 1  muscle spasm after 1   Strength # of seconds 3   Tone high          [facility] Adult PT Treatment/Exercise - 01/04/17 0001      Self-Care   Self-Care Other Self-Care Comments   Other Self-Care Comments  urge to void, pelvic floor meditation                PT Education - 01/04/17 0959    Education provided Yes   Education Details you tube pelvic floor meditation, urge to void (system down, written instructions given)   Person(s) Educated Patient   Methods Explanation;Handout   Comprehension Verbalized understanding          PT Short Term Goals - 01/04/17 1242      PT SHORT TERM GOAL #1   Title independent with initial HEP   Time 4   Period Weeks   Status New   Target Date 02/01/17     PT SHORT TERM GOAL #2   Title pt will be able to perform self massage to perineum for improved soft tissue length   Time 4   Period Weeks   Status New   Target Date 02/01/17     PT SHORT TERM GOAL #3   Title pt will report feeling like she can relax more due to a decrease in the sensation of "everything falling out"   Time 4   Period Weeks   Status New   Target Date 02/01/17     PT SHORT TERM GOAL #4   Title pt able to correctly engage transverse abdominus muscle for improved stability during functional activities   Time 4   Period  Weeks   Status New   Target Date 02/01/17     PT SHORT TERM GOAL #5   Title pt will be able to tolerate internal soft tissue release throughout pelvic floor   Time 4   Period Weeks   Status New   Target Date 02/01/17           PT Long Term Goals - 01/04/17 1255      PT LONG TERM GOAL #1   Title pt will report 1/3 Marinoff scale   Time 16   Period Weeks   Status New   Target Date 04/26/17     PT LONG TERM GOAL #2   Title pt will report overall 50% reduction of symptoms due to improved control of contracting and relaxing pelvic floor muscles.   Time 16   Period Weeks   Status New   Target Date 04/26/17     PT LONG TERM GOAL #3   Title pt will be confident with pain management at home using advanced HEP   Time 16   Period Weeks   Status New   Target Date 04/26/17     PT LONG TERM GOAL #4   Title FOTO < or = to 65% limited based on PFDI   Baseline initial assessment 100%   Time 16   Period Weeks   Status New   Target Date 04/26/17                Plan - 01/04/17 1002    Clinical Impression Statement Patient presents to skilled PT due to chronic pelvic pain that has been worsening over time since first menses.  Pt has excess joint mobility and hypermobile hips bilaterally.  Pt has3/5 MMT pelvic floor but after 2 sec contraction had intense left groin  pain and went into spasm.  Tenderness and fascial adhesion on both sides of urethra.  Unable to assess all pelvic floor muscles due to pain.  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  Pt has high tone pelvic floor and decreased hip and core strength.  She is unable to coordinate abdominal and pelvic floor contraction.  Pt will benefit from skilled PT to addres impairments   History and Personal Factors relevant to plan of care: fibromyalgia, endometriosis, interstitial cystitis, elhers danlos syndrome, multiple abdominal procedures,  half of organs flipped in abdomen   Clinical Presentation Evolving   Clinical Presentation due to: has been progressively getting worse   Clinical Decision Making Moderate   Rehab Potential Excellent   Clinical Impairments Affecting Rehab Potential fibromyalgia, endometriosis, interstitial cystitis, elhers danlos syndrome, multiple abdominal procedures, half of organs flipped in abdomen   PT Frequency 1x / week   PT Duration --  16 weeks   PT Treatment/Interventions ADLs/Self Care Home Management;Electrical Stimulation;Biofeedback;Cryotherapy;Moist Heat;Traction;Ultrasound;Gait training;Stair training;Functional mobility training;Therapeutic activities;Therapeutic exercise;Balance training;Neuromuscular re-education;Patient/family education;Manual techniques;Scar mobilization;Taping;Dry needling   PT Next Visit Plan review HEP, breathing and bulging, abdominal gentle fascia release, external pelvic floor fascia release, sitting on ball, pelvic alignment to find comfortable positions   Consulted and Agree with Plan of Care Patient      Patient will benefit from skilled therapeutic intervention in order to improve the following deficits and impairments:  Difficulty walking, Decreased strength, Pain, Increased muscle spasms, Increased fascial restricitons, Impaired tone  Visit Diagnosis: Other muscle spasm  Muscle weakness (generalized)  Unspecified lack of coordination     Problem List Patient Active Problem List   Diagnosis Date Noted  . BMI 32.0-32.9,adult 10/01/2014  . Anxiety, Depression, Insomnia, Panic disorder 10/01/2014  . Ehlers-Danlos disease 05/19/2014  . Subclinical hyperthyroidism 02/03/2014  . Acne 11/14/2013  . Low serum progesterone 11/14/2013  . Fibromyalgia 04/04/2012  . TMJ (temporomandibular joint syndrome) 04/04/2012  . Migraines 04/04/2012    Zannie Cove, PT 01/04/2017, 1:23 PM  [facility] [address] [place], [place], [address] Phone: [phone]   Fax:  [phone]  Name: Casey Cox MRN: [id number] Date of Birth: 05-Mar-1976

## 2017-02-15 ENCOUNTER — Encounter: Payer: Self-pay | Admitting: Family Medicine

## 2017-04-03 ENCOUNTER — Ambulatory Visit: Payer: Commercial Managed Care - PPO | Attending: Obstetrics & Gynecology | Admitting: Physical Therapy

## 2017-04-03 DIAGNOSIS — M6281 Muscle weakness (generalized): Secondary | ICD-10-CM | POA: Diagnosis present

## 2017-04-03 DIAGNOSIS — R279 Unspecified lack of coordination: Secondary | ICD-10-CM | POA: Insufficient documentation

## 2017-04-03 DIAGNOSIS — M62838 Other muscle spasm: Secondary | ICD-10-CM | POA: Diagnosis not present

## 2017-04-03 NOTE — Therapy (Signed)
[facility] [address] [place], [place], [address] Phone: [phone]   Fax:  [phone]  Physical Therapy Treatment  Patient Details  Name: Casey Cox MRN: [id number] Date of Birth: [birth year] Referring Provider: Lorre Nick   Encounter Date: 04/03/2017  PT End of Session - 04/03/17 1720    Visit Number  2    Date for PT Re-Evaluation  04/26/17    PT Start Time  1700    PT Stop Time  1615    PT Time Calculation (min)  40 min    Activity Tolerance  Patient tolerated treatment well    Behavior During Therapy  [facility] for tasks assessed/performed       Past Medical History:  Diagnosis Date  . Acne    sees dermatologist  . Anxiety and depression   . Ehlers-Danlos syndrome    per pt dx with her rheuamtologist  (joint hypermobility, connective tissue disorder)  . Endometriosis   . Fibromyalgia   . History of concussion    2009-- no residual  . History of melasma    skin depigmentation  . Interstitial cystitis   . Migraine   . Osteoarthritis    jaw, left ankle, knees  . Panic disorder    hx pain attacks  . PCOS (polycystic ovarian syndrome)   . Pelvic pain in female   . Seasonal allergies   . Subclinical hyperthyroidism    sees endocrinologist--- DR Cruzita Lederer  . TMJ syndrome     Past Surgical History:  Procedure Laterality Date  . TMJ ARTHROCENTESIS Bilateral 07-04-2012   w/  IV Sedation in office  . [place] EXTRACTION  2004    There were no vitals filed for this visit.  Subjective Assessment - 04/03/17 1538    Subjective  Pt states the letting things release has gotten better and using urge to void techniques.  States her ED has taken a turn for the worse and she had to focus on her hand issues.  States she is able to commence PT again until the rest of the year.     Patient Stated Goals  learn things to do at home, better knowledge of what is going on inside    Currently in  Pain?  No/denies                      [facility] Adult PT Treatment/Exercise - 04/03/17 0001      Neuro Re-ed    Neuro Re-ed Details   resting tone: 1.46mV, quick contractions max 11.6, ave 3.69mV; hold 10 sec 72mV ave, hold 20 sec 76mV, resting after test exercises as shown in HEP; educated and performed   exercises as shown in HEP; educated and performed     Exercises   Exercises  Lumbar             PT Education - 04/03/17 1719    Education provided  Yes    Education Details  ball squeeze with pelvic left, marching with core and pelvic floor enaged 3-5 sec contract    Person(s) Educated  Patient    Methods  Explanation;Demonstration;Handout;Verbal cues    Comprehension  Verbalized understanding;Returned demonstration       PT Short Term Goals - 04/03/17 1721      PT SHORT TERM GOAL #1   Title  independent with initial HEP    Time  4    Status  Achieved  PT SHORT TERM GOAL #2   Title  pt will be able to perform self massage to perineum for improved soft tissue length    Time  4    Period  Weeks    Status  Achieved      PT SHORT TERM GOAL #3   Title  pt will report feeling like she can relax more due to a decrease in the sensation of "everything falling out"    Time  4    Period  Weeks    Status  Achieved      PT SHORT TERM GOAL #4   Title  pt able to correctly engage transverse abdominus muscle for improved stability during functional activities    Time  4    Period  Weeks    Status  On-going      PT SHORT TERM GOAL #5   Title  pt will be able to tolerate internal soft tissue release throughout pelvic floor    Time  4    Period  Weeks    Status  On-going        PT Long Term Goals - 01/04/17 1255      PT LONG TERM GOAL #1   Title  pt will report 1/3 Marinoff scale    Time  16    Period  Weeks    Status  New    Target Date  04/26/17      PT LONG TERM GOAL #2   Title  pt will report overall 50% reduction of symptoms due to improved  control of contracting and relaxing pelvic floor muscles.    Time  16    Period  Weeks    Status  New    Target Date  04/26/17      PT LONG TERM GOAL #3   Title  pt will be confident with pain management at home using advanced HEP    Time  16    Period  Weeks    Status  New    Target Date  04/26/17      PT LONG TERM GOAL #4   Title  FOTO < or = to 65% limited based on PFDI    Baseline  initial assessment 100%    Time  16    Period  Weeks    Status  New    Target Date  04/26/17            Plan - 04/03/17 1722    Clinical Impression Statement  Patient has had improved symptoms with reduced pain since focusing on relaxation techniques and having some other issues brought under control.  She has not been to PT since eval due to needing to focus on therapy for her hands.  She is still feeling like she is unstable and her hips feel like they are going to pop out.  Pt did well with pelvic floor EMG, demonstrating improved muscle contraction and awareness of how to contract muscles throughout treatment.  She will benefit from skilled PT to work on increased strength and endurance for greater pelvic stability during functional activiites.    Rehab Potential  Excellent    Clinical Impairments Affecting Rehab Potential  fibromyalgia, endometriosis, interstitial cystitis, elhers danlos syndrome, multiple abdominal procedures, half of organs flipped in abdomen    PT Treatment/Interventions  ADLs/Self Care Home Management;Electrical Stimulation;Biofeedback;Cryotherapy;Moist Heat;Traction;Ultrasound;Gait training;Stair training;Functional mobility training;Therapeutic activities;Therapeutic exercise;Balance training;Neuromuscular re-education;Patient/family education;Manual techniques;Scar mobilization;Taping;Dry needling    PT Next Visit Plan  pelvic  alignment, sitting on ball, progress pelvic and core strength    Consulted and Agree with Plan of Care  Patient       Patient will benefit from  skilled therapeutic intervention in order to improve the following deficits and impairments:  Difficulty walking, Decreased strength, Pain, Increased muscle spasms, Increased fascial restricitons, Impaired tone  Visit Diagnosis: Other muscle spasm  Muscle weakness (generalized)  Unspecified lack of coordination     Problem List Patient Active Problem List   Diagnosis Date Noted  . BMI 32.0-32.9,adult 10/01/2014  . Anxiety, Depression, Insomnia, Panic disorder 10/01/2014  . Ehlers-Danlos disease 05/19/2014  . Subclinical hyperthyroidism 02/03/2014  . Acne 11/14/2013  . Low serum progesterone 11/14/2013  . Fibromyalgia 04/04/2012  . TMJ (temporomandibular joint syndrome) 04/04/2012  . Migraines 04/04/2012    Zannie Cove, PT 04/03/2017, 5:32 PM  [facility] [address], [place], [place], [address] Phone: [phone]   Fax:  [phone]  Name: JIM PHILEMON MRN: [id number] Date of Birth: [birth year]

## 2017-04-03 NOTE — Patient Instructions (Signed)
   Squeeze ball and pelvic floor hold 5 sec, rest 5 sec - repeat 10x/ day  Same position without the ball keeping legs parallel and knees in line with hips - hold 3 seconds, rest 5 seconds - repeat 10x     Transverse Abdominus Activation  Contract your lower abdominals and pelvic floor, as if you were trying to lift one leg from the table.  Initiate the movement but do no lift foot greater than 1 inch from the table.  Hold 2 seconds. Repeat opposite side. Do 10x each side

## 2017-04-12 ENCOUNTER — Ambulatory Visit: Payer: Commercial Managed Care - PPO | Admitting: Physical Therapy

## 2017-04-12 DIAGNOSIS — M62838 Other muscle spasm: Secondary | ICD-10-CM | POA: Diagnosis not present

## 2017-04-12 DIAGNOSIS — M6281 Muscle weakness (generalized): Secondary | ICD-10-CM

## 2017-04-12 DIAGNOSIS — R279 Unspecified lack of coordination: Secondary | ICD-10-CM

## 2017-04-12 NOTE — Patient Instructions (Signed)
   Brace your pelvis by lifting the pelvic floor and drawing belly button to spine Stand and sit slowly back down keeping that brace - 10x Do small squats and hover just before sitting and hold 3 seconds while keeping the core braced - repeat 10x   Increase ball squeeze to 10 second hold - do 10x

## 2017-04-12 NOTE — Therapy (Signed)
[facility] [address] [place], [place], [address] Phone: [phone]   Fax:  [phone]  Physical Therapy Treatment  Patient Details  Name: Casey Cox MRN: [id number] Date of Birth: 11-27-1975 Referring Provider: Lorre Nick   Encounter Date: 04/12/2017  PT End of Session - 04/12/17 1540    Visit Number  3    Date for PT Re-Evaluation  04/26/17    PT Start Time  2595    PT Stop Time  1616    PT Time Calculation (min)  38 min    Activity Tolerance  Patient tolerated treatment well    Behavior During Therapy  [facility] for tasks assessed/performed       Past Medical History:  Diagnosis Date  . Acne    sees dermatologist  . Anxiety and depression   . Ehlers-Danlos syndrome    per pt dx with her rheuamtologist  (joint hypermobility, connective tissue disorder)  . Endometriosis   . Fibromyalgia   . History of concussion    2009-- no residual  . History of melasma    skin depigmentation  . Interstitial cystitis   . Migraine   . Osteoarthritis    jaw, left ankle, knees  . Panic disorder    hx pain attacks  . PCOS (polycystic ovarian syndrome)   . Pelvic pain in female   . Seasonal allergies   . Subclinical hyperthyroidism    sees endocrinologist--- DR Cruzita Lederer  . TMJ syndrome     Past Surgical History:  Procedure Laterality Date  . ANKLE RECONSTRUCTION Left 04/08/2014   Procedure: RECONSTRUCTION LEFT ANKLE, REPAIR SECONDARY DISRUPTED LIGAMENT ANKLE COLLATERAL, TRANSPLANTERIOR/TRANSFER TENDON TIBIAL EXTENSOR INTO MIDFOOT DEEP;  Surgeon: Yvette Rack., MD;  Location: [facility];  Service: Orthopedics;  Laterality: Left;  . ARTHROTOMY Bilateral 12/23/2013   Procedure: BILATERAL ARTHROTOMY, MENISECTOMY, TEMPORALIS FASCIA GRAFT;  Surgeon: Gae Bon, DDS;  Location: [facility];  Service: Oral Surgery;  Laterality: Bilateral;   TMJ  . LAPAROSCOPY N/A 06/24/2013   Procedure:  LAPAROSCOPY OPERATIVE WITH CHROMOPERTUBATION, LYSIS OF ADHESIONS AND APPENDECTOMY ;  Surgeon: Linda Hedges, DO;  Location: [facility];  Service: Gynecology;  Laterality: N/A;  . LAPAROSCOPY N/A 08/07/2016   Procedure: LAPAROSCOPY OPERATIVE, FULGERATION  OF ENDOMETRIOSIS ;  Surgeon: Linda Hedges, DO;  Location: [facility];  Service: Gynecology;  Laterality: N/A;  . LIGAMENT REPAIR Right 01/13/2014   Procedure: RIGHT ANKLE REPAIR SECONDAY DISRUPTED LIGAMENT ANKLE  COLLATERAL WITH PERONEAL TENDON AUTOGRAFT;  Surgeon: Yvette Rack., MD;  Location: [facility];  Service: Orthopedics;  Laterality: Right;  . TMJ ARTHROCENTESIS Bilateral 07-04-2012   w/  IV Sedation in office  . [place] EXTRACTION  2004    There were no vitals filed for this visit.  Subjective Assessment - 04/12/17 1539    Subjective  Pt states the exercises are working but she is not sure she feels much stronger yet.  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    Pertinent History  fibromyalgia, endometriosis, interstitial cystitis, elhers danlos syndrome, multiple abdominal procedures, half of organs flipped in abdomen    Limitations  Sitting;Lifting;Walking;Standing;Other (comment)    Patient Stated Goals  learn things to do at home, better knowledge of what is going on inside    Currently in Pain?  No/denies  Pelvic Floor Special Questions - 04/12/17 0001    Biofeedback sensor type  Surface    Biofeedback Activity  Other see exercises        [facility] Adult PT Treatment/Exercise - 04/12/17 0001      Neuro Re-ed    Neuro Re-ed Details   biofeedback EMG: 5-10 sec holds, brace with functional movments sit to stand and squats      Lumbar Exercises: Seated   Sit to Stand  15 reps    Other Seated Lumbar Exercises  squats with 3 sec hold      Lumbar Exercises: Supine    Clam  10 reps;3 seconds red band    Bent Knee Raise  10 reps;5 seconds    Other Supine Lumbar Exercises  ball squeeze 5 sec hold, 5 sec rest              PT Education - 04/12/17 1623    Education provided  Yes    Education Details  ball squeeze 10 sec holds, sit to stand with pelvic brace    Person(s) Educated  Patient    Methods  Explanation;Demonstration;Handout;Verbal cues    Comprehension  Verbalized understanding;Returned demonstration       PT Short Term Goals - 04/03/17 1721      PT SHORT TERM GOAL #1   Title  independent with initial HEP    Time  4    Status  Achieved      PT SHORT TERM GOAL #2   Title  pt will be able to perform self massage to perineum for improved soft tissue length    Time  4    Period  Weeks    Status  Achieved      PT SHORT TERM GOAL #3   Title  pt will report feeling like she can relax more due to a decrease in the sensation of "everything falling out"    Time  4    Period  Weeks    Status  Achieved      PT SHORT TERM GOAL #4   Title  pt able to correctly engage transverse abdominus muscle for improved stability during functional activities    Time  4    Period  Weeks    Status  On-going      PT SHORT TERM GOAL #5   Title  pt will be able to tolerate internal soft tissue release throughout pelvic floor    Time  4    Period  Weeks    Status  On-going        PT Long Term Goals - 01/04/17 1255      PT LONG TERM GOAL #1   Title  pt will report 1/3 Marinoff scale    Time  16    Period  Weeks    Status  New    Target Date  04/26/17      PT LONG TERM GOAL #2   Title  pt will report overall 50% reduction of symptoms due to improved control of contracting and relaxing pelvic floor muscles.    Time  16    Period  Weeks    Status  New    Target Date  04/26/17      PT LONG TERM GOAL #3   Title  pt will be confident with pain management at home using advanced HEP    Time  16    Period  Weeks    Status  New  Target  Date  04/26/17      PT LONG TERM GOAL #4   Title  FOTO < or = to 65% limited based on PFDI    Baseline  initial assessment 100%    Time  16    Period  Weeks    Status  New    Target Date  04/26/17            Plan - 04/12/17 1624    Clinical Impression Statement  Patient did well with biofeedback and was able to hold for up to 10 sec getting her pelvic contraction up to >47mV and comes right back down to rest <79mV.  Pt needs cues for pelvic and knee alignment during sit to stand and squats.  She also demonstrates fatigue of adductor muscles and holds pelvic floor tighter when LE not supported in supine  Pt will benefit from skilled PT to improved muscle balance and coordination in order to improve functional mobilty without feeling of prolapse or feeling like hips will give way.    Clinical Impairments Affecting Rehab Potential  fibromyalgia, endometriosis, interstitial cystitis, elhers danlos syndrome, multiple abdominal procedures, half of organs flipped in abdomen    PT Treatment/Interventions  ADLs/Self Care Home Management;Electrical Stimulation;Biofeedback;Cryotherapy;Moist Heat;Traction;Ultrasound;Gait training;Stair training;Functional mobility training;Therapeutic activities;Therapeutic exercise;Balance training;Neuromuscular re-education;Patient/family education;Manual techniques;Scar mobilization;Taping;Dry needling    PT Next Visit Plan  pelvic alignment, sitting on ball, progress pelvic and core strength, wall push ups, press ups    Consulted and Agree with Plan of Care  Patient       Patient will benefit from skilled therapeutic intervention in order to improve the following deficits and impairments:  Difficulty walking, Decreased strength, Pain, Increased muscle spasms, Increased fascial restricitons, Impaired tone  Visit Diagnosis: Other muscle spasm  Muscle weakness (generalized)  Unspecified lack of coordination     Problem List Patient Active Problem List    Diagnosis Date Noted  . BMI 32.0-32.9,adult 10/01/2014  . Anxiety, Depression, Insomnia, Panic disorder 10/01/2014  . Ehlers-Danlos disease 05/19/2014  . Subclinical hyperthyroidism 02/03/2014  . Acne 11/14/2013  . Low serum progesterone 11/14/2013  . Fibromyalgia 04/04/2012  . TMJ (temporomandibular joint syndrome) 04/04/2012  . Migraines 04/04/2012    Zannie Cove, PT 04/12/2017, 4:43 PM   [facility] [address] [place], [place], [address] Phone: [phone]   Fax:  [phone]  Name: Casey Cox MRN: [id number] Date of Birth: [birth year]

## 2017-04-16 ENCOUNTER — Encounter: Payer: Self-pay | Admitting: Physical Therapy

## 2017-04-24 ENCOUNTER — Ambulatory Visit: Payer: Commercial Managed Care - PPO | Admitting: Physical Therapy

## 2017-05-01 ENCOUNTER — Ambulatory Visit: Payer: Commercial Managed Care - PPO | Attending: Obstetrics & Gynecology | Admitting: Physical Therapy

## 2017-05-01 DIAGNOSIS — M6281 Muscle weakness (generalized): Secondary | ICD-10-CM

## 2017-05-01 DIAGNOSIS — M62838 Other muscle spasm: Secondary | ICD-10-CM | POA: Diagnosis not present

## 2017-05-01 DIAGNOSIS — R279 Unspecified lack of coordination: Secondary | ICD-10-CM | POA: Diagnosis present

## 2017-05-01 NOTE — Patient Instructions (Signed)
   Sitting on physioball: - circles both ways - side to side - rows and extension (shoulder exercises) - marching legs - leg kick (kick leg out straight adding weight for increased difficulty) - ball and pelvic floor squeeze - hip abduction (band around knees)  [facility] [address] [place],  [address] Phone # [phone] Fax [phone]

## 2017-05-01 NOTE — Therapy (Addendum)
[facility] [address], [place], [place], [address] Phone: [phone]   Fax:  [phone]  Physical Therapy Treatment  Patient Details  Name: Casey Cox MRN: [id number] Date of Birth: 08/04/1975 Referring Provider: Lorre Nick   Encounter Date: 05/01/2017  PT End of Session - 05/01/17 1624    Visit Number  4    Date for PT Re-Evaluation  07/24/17    PT Start Time  1618    PT Stop Time  1700    PT Time Calculation (min)  42 min    Activity Tolerance  Patient tolerated treatment well    Behavior During Therapy  [facility] for tasks assessed/performed       Past Medical History:  Diagnosis Date  . Acne    sees dermatologist  . Anxiety and depression   . Ehlers-Danlos syndrome    per pt dx with her rheuamtologist  (joint hypermobility, connective tissue disorder)  . Endometriosis   . Fibromyalgia   . History of concussion    2009-- no residual  . History of melasma    skin depigmentation  . Interstitial cystitis   . Migraine   . Osteoarthritis    jaw, left ankle, knees  . Panic disorder    hx pain attacks  . PCOS (polycystic ovarian syndrome)   . Pelvic pain in female   . Seasonal allergies   . Subclinical hyperthyroidism    sees endocrinologist--- DR Cruzita Lederer  . TMJ syndrome     Past Surgical History:  Procedure Laterality Date  . ANKLE RECONSTRUCTION Left 04/08/2014   Procedure: RECONSTRUCTION LEFT ANKLE, REPAIR SECONDARY DISRUPTED LIGAMENT ANKLE COLLATERAL, TRANSPLANTERIOR/TRANSFER TENDON TIBIAL EXTENSOR INTO MIDFOOT DEEP;  Surgeon: Yvette Rack., MD;  Location: [place];  Service: Orthopedics;  Laterality: Left;  . ARTHROTOMY Bilateral 12/23/2013   Procedure: BILATERAL ARTHROTOMY, MENISECTOMY, TEMPORALIS FASCIA GRAFT;  Surgeon: Gae Bon, DDS;  Location: [place];  Service: Oral Surgery;  Laterality: Bilateral;   TMJ  . LAPAROSCOPY N/A 06/24/2013   Procedure:  LAPAROSCOPY OPERATIVE WITH CHROMOPERTUBATION, LYSIS OF ADHESIONS AND APPENDECTOMY ;  Surgeon: Linda Hedges, DO;  Location: [facility];  Service: Gynecology;  Laterality: N/A;  . LAPAROSCOPY N/A 08/07/2016   Procedure: LAPAROSCOPY OPERATIVE, FULGERATION  OF ENDOMETRIOSIS ;  Surgeon: Linda Hedges, DO;  Location: [place];  Service: Gynecology;  Laterality: N/A;  . LIGAMENT REPAIR Right 01/13/2014   Procedure: RIGHT ANKLE REPAIR SECONDAY DISRUPTED LIGAMENT ANKLE  COLLATERAL WITH PERONEAL TENDON AUTOGRAFT;  Surgeon: Yvette Rack., MD;  Location: [place];  Service: Orthopedics;  Laterality: Right;  . TMJ ARTHROCENTESIS Bilateral 07-04-2012   w/  IV Sedation in office  . [place] EXTRACTION  2004    There were no vitals filed for this visit.  Subjective Assessment - 05/01/17 1627    Subjective  The constant severe pain is better and I feel more confident being able to do the exercises.  I want    Patient Stated Goals  learn things to do at home, better knowledge of what is going on inside         [facility] PT Assessment - 05/01/17 0001      Observation/Other Assessments   Focus on Therapeutic Outcomes (FOTO)   54% limited      PROM   Overall PROM Comments  hypermobile bilateral hip history of joint displacement due to ED      Strength  Overall Strength Comments  pelvic floor strength 3/5               Pelvic Floor Special Questions - 05/01/17 0001    Marinoff Scale  pain interrupts completion        [facility] Adult PT Treatment/Exercise - 05/01/17 0001      Neuro Re-ed    Neuro Re-ed Details   seated on ball, circles and side to side, pelvic floor contract relax, all exercises listed on HEP educated and performed      Lumbar Exercises: Seated   Long Arc Quad on [employer];Both    Hip Flexion on Ball  Strengthening;20 reps    Other Seated Lumbar Exercises  UE and LE on ball holding 1lb weights - 20x    Other Seated Lumbar  Exercises  hip abd/add - green band and ball - 20x on pball      Lumbar Exercises: Supine   Other Supine Lumbar Exercises  sitting on pball - power tower rows, ext - 20x 15#             PT Education - 05/01/17 1708    Education provided  Yes    Education Details  HEP    Person(s) Educated  Patient    Methods  Explanation;Demonstration;Verbal cues;Handout    Comprehension  Verbalized understanding;Returned demonstration       PT Short Term Goals - 05/01/17 1624      PT SHORT TERM GOAL #4   Title  pt able to correctly engage transverse abdominus muscle for improved stability during functional activities    Time  4    Period  Weeks    Status  On-going    Target Date  05/29/17      PT SHORT TERM GOAL #5   Title  pt will be able to tolerate internal soft tissue release throughout pelvic floor    Time  4    Period  Weeks    Status  On-going    Target Date  05/29/17        PT Long Term Goals - 05/01/17 1628      PT LONG TERM GOAL #1   Title  pt will report 1/3 Marinoff scale    Baseline  decreased significantly    Status  Deferred      PT LONG TERM GOAL #2   Title  pt will report overall 50% reduction of symptoms due to improved control of contracting and relaxing pelvic floor muscles.    Baseline  75-80% better    Status  Achieved      PT LONG TERM GOAL #3   Title  pt will be confident with pain management at home using advanced HEP    Baseline  feels confident but not sure about being discharged yet, wants to try going on her own to make sure she is able to maintain    Time  12    Period  Weeks    Status  On-going    Target Date  07/24/17      PT LONG TERM GOAL #4   Title  FOTO < or = to 65% limited based on PFDI    Baseline  initial assessment 100%, today 54% lmited    Status  Achieved            Plan - 05/01/17 1625    Clinical Impression Statement  Patient has had an overall decrease in pain since starting PT. She has met  most of her long term  goals.  She continues to feel instability in hips and pelvis due to decreased strength and connective tissue disorder.  Pt has been educated and given HEP to progress her hip, core and pelvic floor strength today.  Due to her connective tissue disorder, she will progress more slowly, but will benefit from continued PT 1x/month in order to ensure safe transition to HEP    History and Personal Factors relevant to plan of care:  fibromyalgia, endometriosis, IC, ED, abdominal surgeries, half organs flipped in abdomen    Clinical Presentation  Evolving    Rehab Potential  Excellent    Clinical Impairments Affecting Rehab Potential  fibromyalgia, endometriosis, interstitial cystitis, elhers danlos syndrome, multiple abdominal procedures, half of organs flipped in abdomen    PT Frequency  Monthy    PT Duration  12 weeks    PT Treatment/Interventions  ADLs/Self Care Home Management;Electrical Stimulation;Biofeedback;Cryotherapy;Moist Heat;Traction;Ultrasound;Gait training;Stair training;Functional mobility training;Therapeutic activities;Therapeutic exercise;Balance training;Neuromuscular re-education;Patient/family education;Manual techniques;Scar mobilization;Taping;Dry needling    PT Next Visit Plan  pball exercises, review and update HEP as needed, wall push ups    Consulted and Agree with Plan of Care  Patient       Patient will benefit from skilled therapeutic intervention in order to improve the following deficits and impairments:  Difficulty walking, Decreased strength, Pain, Increased muscle spasms, Increased fascial restricitons, Impaired tone  Visit Diagnosis: Other muscle spasm  Muscle weakness (generalized)  Unspecified lack of coordination     Problem List Patient Active Problem List   Diagnosis Date Noted  . BMI 32.0-32.9,adult 10/01/2014  . Anxiety, Depression, Insomnia, Panic disorder 10/01/2014  . Ehlers-Danlos disease 05/19/2014  . Subclinical hyperthyroidism 02/03/2014  .  Acne 11/14/2013  . Low serum progesterone 11/14/2013  . Fibromyalgia 04/04/2012  . TMJ (temporomandibular joint syndrome) 04/04/2012  . Migraines 04/04/2012    Wayne Both 05/01/2017, 5:23 PM  [facility] [address] [place], [place], [address] Phone: [phone]   Fax:  [phone]  Name: Casey Cox MRN: [id number] Date of Birth: Apr 19, 1976  PHYSICAL THERAPY DISCHARGE SUMMARY  Visits from Start of Care: 4  Current functional level related to goals / functional outcomes: See above most recent update   Remaining deficits: See above   Education / Equipment: HEP  Plan: Patient agrees to discharge.  Patient goals were partially met. Patient is being discharged due to not returning since the last visit.  ?????    Pt was on hold from discharge since last visit and she decided not to return.  Episode discharged  [facility], PT 06/20/17 10:41 AM

## 2017-05-08 ENCOUNTER — Encounter: Payer: Self-pay | Admitting: Physical Therapy

## 2017-05-15 ENCOUNTER — Encounter: Payer: Self-pay | Admitting: Physical Therapy

## 2017-06-07 ENCOUNTER — Encounter: Payer: Self-pay | Admitting: Family Medicine

## 2018-04-16 DIAGNOSIS — F410 Panic disorder [episodic paroxysmal anxiety] without agoraphobia: Secondary | ICD-10-CM | POA: Insufficient documentation

## 2018-04-16 DIAGNOSIS — F419 Anxiety disorder, unspecified: Secondary | ICD-10-CM | POA: Insufficient documentation

## 2020-06-08 LAB — PAP IG AND HPV HIGH-RISK: HPV Aptima: NEGATIVE

## 2020-06-10 ENCOUNTER — Other Ambulatory Visit: Payer: Self-pay | Admitting: Obstetrics & Gynecology

## 2020-06-10 DIAGNOSIS — N63 Unspecified lump in unspecified breast: Secondary | ICD-10-CM

## 2020-07-20 ENCOUNTER — Ambulatory Visit
Admission: RE | Admit: 2020-07-20 | Discharge: 2020-07-20 | Disposition: A | Discharge status: Home / Self Care | Payer: Commercial Managed Care - PPO | Source: Ambulatory Visit | Attending: Obstetrics & Gynecology | Admitting: Obstetrics & Gynecology

## 2020-07-20 ENCOUNTER — Other Ambulatory Visit: Payer: Self-pay | Admitting: Obstetrics & Gynecology

## 2020-07-20 ENCOUNTER — Other Ambulatory Visit: Payer: Self-pay

## 2020-07-20 ENCOUNTER — Ambulatory Visit
Admission: RE | Admit: 2020-07-20 | Discharge: 2020-07-20 | Disposition: A | Discharge status: Home / Self Care | Payer: PRIVATE HEALTH INSURANCE | Source: Ambulatory Visit | Attending: Obstetrics & Gynecology | Admitting: Obstetrics & Gynecology

## 2020-07-20 DIAGNOSIS — N63 Unspecified lump in unspecified breast: Secondary | ICD-10-CM

## 2020-09-02 ENCOUNTER — Ambulatory Visit: Payer: BC Managed Care – PPO | Attending: Obstetrics & Gynecology | Admitting: Physical Therapy

## 2020-09-02 ENCOUNTER — Encounter: Payer: Self-pay | Admitting: Physical Therapy

## 2020-09-02 ENCOUNTER — Other Ambulatory Visit: Payer: Self-pay

## 2020-09-02 DIAGNOSIS — M545 Low back pain, unspecified: Secondary | ICD-10-CM | POA: Diagnosis present

## 2020-09-02 DIAGNOSIS — R102 Pelvic and perineal pain: Secondary | ICD-10-CM | POA: Diagnosis present

## 2020-09-02 DIAGNOSIS — G8929 Other chronic pain: Secondary | ICD-10-CM

## 2020-09-02 DIAGNOSIS — M6281 Muscle weakness (generalized): Secondary | ICD-10-CM

## 2020-09-02 DIAGNOSIS — R278 Other lack of coordination: Secondary | ICD-10-CM | POA: Diagnosis present

## 2020-09-02 NOTE — Therapy (Signed)
[facility] [address], [place], [place], [address] Phone: [phone]   Fax:  [phone]  Physical Therapy Evaluation  Patient Details  Name: Casey Cox MRN: [id number] Date of Birth: 1976/04/11 Referring Provider (PT): Dr. Iona Coach Dasouki Abu-Alnadi   Encounter Date: 09/02/2020   PT End of Session - 09/02/20 1520    Visit Number 1    Date for PT Re-Evaluation 01/02/21    Authorization Type [employer]    Authorization - Visit Number 1    Authorization - Number of Visits 30    PT Start Time 4196    PT Stop Time 1230    PT Time Calculation (min) 45 min    Activity Tolerance Patient tolerated treatment well;Patient limited by pain    Behavior During Therapy [facility] for tasks assessed/performed           Past Medical History:  Diagnosis Date  . Acne    sees dermatologist  . Anxiety and depression   . Ehlers-Danlos syndrome    per pt dx with her rheuamtologist  (joint hypermobility, connective tissue disorder)  . Endometriosis   . Fibromyalgia   . History of concussion    2009-- no residual  . History of melasma    skin depigmentation  . Interstitial cystitis   . Migraine   . Osteoarthritis    jaw, left ankle, knees  . Panic disorder    hx pain attacks  . PCOS (polycystic ovarian syndrome)   . Pelvic pain in female   . Seasonal allergies   . Subclinical hyperthyroidism    sees endocrinologist--- DR Cruzita Lederer  . TMJ syndrome     Past Surgical History:  Procedure Laterality Date  . ANKLE RECONSTRUCTION Left 04/08/2014   Procedure: RECONSTRUCTION LEFT ANKLE, REPAIR SECONDARY DISRUPTED LIGAMENT ANKLE COLLATERAL, TRANSPLANTERIOR/TRANSFER TENDON TIBIAL EXTENSOR INTO MIDFOOT DEEP;  Surgeon: Yvette Rack., MD;  Location: [place];  Service: Orthopedics;  Laterality: Left;  . ARTHROTOMY Bilateral 12/23/2013   Procedure: BILATERAL ARTHROTOMY, MENISECTOMY, TEMPORALIS FASCIA GRAFT;  Surgeon:  Gae Bon, DDS;  Location: [place];  Service: Oral Surgery;  Laterality: Bilateral;   TMJ  . LAPAROSCOPY N/A 06/24/2013   Procedure: LAPAROSCOPY OPERATIVE WITH CHROMOPERTUBATION, LYSIS OF ADHESIONS AND APPENDECTOMY ;  Surgeon: Linda Hedges, DO;  Location: [facility];  Service: Gynecology;  Laterality: N/A;  . LAPAROSCOPY N/A 08/07/2016   Procedure: LAPAROSCOPY OPERATIVE, FULGERATION  OF ENDOMETRIOSIS ;  Surgeon: Linda Hedges, DO;  Location: [place];  Service: Gynecology;  Laterality: N/A;  . LIGAMENT REPAIR Right 01/13/2014   Procedure: RIGHT ANKLE REPAIR SECONDAY DISRUPTED LIGAMENT ANKLE  COLLATERAL WITH PERONEAL TENDON AUTOGRAFT;  Surgeon: Yvette Rack., MD;  Location: [place];  Service: Orthopedics;  Laterality: Right;  . TMJ ARTHROCENTESIS Bilateral 07-04-2012   w/  IV Sedation in office  . [place] EXTRACTION  2004    There were no vitals filed for this visit.    Subjective Assessment - 09/02/20 1158    Subjective Patient has been doing her HEP at home due to [place]. Patient feels like everything is falling out. Constipation is worse. Patient has had steroid shots internally to calm the muscles. Not able to do the strengthening exercises due to pain. Not able to have sex. Medication pulls pain down a little.    Patient Stated Goals Back to feeling better and get to baseline of where she is.    Currently in  Pain? Yes    Pain Score 10-Worst pain ever   never below 5/10   Pain Location Pelvis    Pain Orientation Mid;Lower    Pain Descriptors / Indicators Burning;Pressure;Aching;Dull;Stabbing    Pain Type Chronic pain    Pain Onset More than a month ago    Pain Frequency Constant    Aggravating Factors  urinating, sexual stimulation, walking for length of time, riding in a car, sitting that is not a soft cushion    Pain Relieving Factors sitting on a soft cushion, hot shower and bath, sleep in hammock    Multiple Pain Sites Yes    Pain Score 10     Pain Location Back    Pain Orientation Upper;Mid;Lower    Pain Descriptors / Indicators Cramping    Pain Type Chronic pain    Pain Onset More than a month ago    Pain Frequency Constant    Aggravating Factors  physical activity that is not soft and still, sleeping funny    Pain Relieving Factors muscle release, laying down, hot showers              [facility] PT Assessment - 09/02/20 0001      Assessment   Medical Diagnosis M62.89 Pelvic floor dysfunction    Referring Provider (PT) Dr. Iona Coach Dasouki Abu-Alnadi    Onset Date/Surgical Date --   chronic   Prior Therapy yes pelvic floor      Precautions   Precautions Other (comment)    Precaution Comments Ehlers-Danlos      Restrictions   Weight Bearing Restrictions No      Balance Screen   Has the patient fallen in the past 6 months Yes    How many times? due to autonomic system   postural change feels like she will faint; joints just give out; 2 times per week   Has the patient had a decrease in activity level because of a fear of falling?  Yes    Is the patient reluctant to leave their home because of a fear of falling?  Yes      [place] residence      Prior Function   Level of Independence Independent      Cognition   Overall Cognitive Status Within Functional Limits for tasks assessed      Posture/Postural Control   Posture/Postural Control No significant limitations      ROM / Strength   AROM / PROM / Strength AROM;PROM;Strength      Palpation   Spinal mobility rib angle greater than 90 degrees    Palpation comment tendernss located in the abdomen; tenderness located in the thoracic parapinals and SI joint; gluteus maximus                      Objective measurements completed on examination: See above findings.     Pelvic Floor Special Questions - 09/02/20 0001    Currently Sexually Active No   too painful   Marinoff Scale pain prevents any attempts at  intercourse    Urinary Leakage No    External Perineal Exam clitoral hood is adherred to the clitorus    Skin Integrity Intact;Other    External Palpation bil. ishiocavernosus, perineal body, superficial transverse,    Pelvic Floor Internal Exam Patient confirms identification and approves PT to assess pelvic floor and treatment    Exam Type Vaginal  PT Education - 09/02/20 1519    Education Details how to massage around the ischiocavernosus and pull the clitoral hood off the clitorus    Person(s) Educated Patient    Methods Explanation;Demonstration    Comprehension Verbalized understanding            PT Short Term Goals - 09/02/20 1711      PT SHORT TERM GOAL #1   Title independent with initial HEP    Time 4    Period Weeks    Status New    Target Date 09/30/20      PT SHORT TERM GOAL #2   Title pt will be able to perform self massage to perineum for improved soft tissue length    Time 4    Period Weeks    Status New    Target Date 09/30/20      PT SHORT TERM GOAL #3   Title pt will report feeling like she can relax more due to a decrease in the sensation of "everything falling out"    Time 4    Period Weeks    Status New    Target Date 09/30/20      PT SHORT TERM GOAL #4   Title pt able to correctly engage transverse abdominus muscle for improved stability during functional activities    Time 4    Period Weeks    Status On-going    Target Date 09/30/20      PT SHORT TERM GOAL #5   Title pt will be able to tolerate internal soft tissue release throughout pelvic floor    Time 4    Period Weeks    Status New    Target Date 09/30/20             PT Long Term Goals - 09/02/20 1712      PT LONG TERM GOAL #1   Title patient will be able to engage in sexual stimulation with her partner due to reduction of pain    Time 4    Period Months    Status New    Target Date 01/02/21      PT LONG TERM GOAL #2   Title pt will report  overall 50% reduction of symptoms due to improved control of contracting and relaxing pelvic floor muscles.    Baseline ---    Time 4    Period Months    Status New    Target Date 01/02/21      PT LONG TERM GOAL #3   Title pt will be confident with pain management at home using advanced HEP, pelvic wand and electrical stimulation    Baseline ---    Time 4    Period Months    Status New    Target Date 01/02/21      PT LONG TERM GOAL #4   Title burning with urination decreased >/= 50% due to reduction of pelvic floor tension    Baseline ---    Time 4    Period Months    Status New    Target Date 01/02/21                  Plan - 09/02/20 1521    Clinical Impression Statement Patient is a 45 year old female with pelvic floor dysfunction for many years. She has an extensive history of pelvic pain, confirmed endometriosis on 10/10/2016 with laproscopic surgery, Ehler-Danlos, fibromyalgia, and interstitial cystitis. Patient reports her pelvic pain is average  5/10 but gets to a 10/10 with sexual stimulation, urinating, walking for a long period of time and sitting on a firm surface. Back pain is constant 10/10 with physical activity. Lumbar ROM is full but her pelvis has to be stabilized so she does not loose her balance. Patient falls 2 times per week due to her legs giving way and when she changes positions. Rib angle is greater than 90 degrees. Tenderness located in the abdomen, SI joint, guteus maximus. Q-tip test was painful throughout the vulvar area. When insert the Q-tip into the introitus was at pain level 3/10 so internal assessment was not performed. Patient has tenderness located on the ischiocavernosus, perineal body, and superficial transverse. Clitoral hood is attached to the clitorus. Patient will benefit from skilled therapy to reduce pain and improve movement.    Personal Factors and Comorbidities Fitness;Comorbidity 3+    Comorbidities Endometriosis; Fibromyalgia;  Ehlers-Danlos, IC    Examination-Activity Limitations Locomotion Level;Transfers;Lift;Stand;Squat;Sleep;Sit;Bed Mobility    Examination-Participation Restrictions Occupation;Community Activity;Driving;Interpersonal Relationship;Laundry;Shop;Cleaning;Meal Prep    Stability/Clinical Decision Making Evolving/Moderate complexity    Clinical Decision Making Moderate    Rehab Potential Good    PT Frequency 2x / week    PT Duration Other (comment)   4 months   PT Treatment/Interventions ADLs/Self Care Home Management;Biofeedback;Cryotherapy;Electrical Stimulation;Moist Heat;Ultrasound;Neuromuscular re-education;Therapeutic exercise;Therapeutic activities;Patient/family education;Manual techniques;Passive range of motion;Dry needling;Energy conservation;Taping;Spinal Manipulations;Joint Manipulations   pelvic wand, electrical stimulation   PT Next Visit Plan talk about electrical stimulation; diaphramatic breathing for downtraining, manual work to [employer] and outside of pelvic floor, stretches    Consulted and Agree with Plan of Care Patient           Patient will benefit from skilled therapeutic intervention in order to improve the following deficits and impairments:  Decreased coordination,Increased fascial restricitons,Decreased endurance,Decreased activity tolerance,Increased muscle spasms,Pain,Decreased strength  Visit Diagnosis: Muscle weakness (generalized) - Plan: PT plan of care cert/re-cert  Other lack of coordination - Plan: PT plan of care cert/re-cert  Chronic bilateral low back pain without sciatica - Plan: PT plan of care cert/re-cert  Pelvic pain - Plan: PT plan of care cert/re-cert     Problem List Patient Active Problem List   Diagnosis Date Noted  . BMI 32.0-32.9,adult 10/01/2014  . Anxiety, Depression, Insomnia, Panic disorder 10/01/2014  . Ehlers-Danlos disease 05/19/2014  . Subclinical hyperthyroidism 02/03/2014  . Acne 11/14/2013  . Low serum progesterone  11/14/2013  . Fibromyalgia 04/04/2012  . TMJ (temporomandibular joint syndrome) 04/04/2012  . Migraines 04/04/2012    Earlie Counts, PT 09/02/20 5:18 PM   [facility] [address], [place], [place], [address] Phone: [phone]   Fax:  [phone]  Name: Casey Cox MRN: [id number] Date of Birth: 24-Feb-1976

## 2020-09-08 ENCOUNTER — Ambulatory Visit: Payer: BC Managed Care – PPO | Admitting: Physical Therapy

## 2020-09-08 ENCOUNTER — Other Ambulatory Visit: Payer: Self-pay

## 2020-09-08 ENCOUNTER — Encounter: Payer: Self-pay | Admitting: Physical Therapy

## 2020-09-08 DIAGNOSIS — M545 Low back pain, unspecified: Secondary | ICD-10-CM

## 2020-09-08 DIAGNOSIS — R278 Other lack of coordination: Secondary | ICD-10-CM

## 2020-09-08 DIAGNOSIS — M6281 Muscle weakness (generalized): Secondary | ICD-10-CM

## 2020-09-08 DIAGNOSIS — G8929 Other chronic pain: Secondary | ICD-10-CM

## 2020-09-08 DIAGNOSIS — R102 Pelvic and perineal pain: Secondary | ICD-10-CM

## 2020-09-08 NOTE — Therapy (Signed)
[facility] [address], [place], [place], [address] Phone: [phone]   Fax:  [phone]  Physical Therapy Treatment  Patient Details  Name: Casey Cox MRN: [id number] Date of Birth: August 30, 1975 Referring Provider (PT): Dr. Iona Coach Dasouki Abu-Alnadi   Encounter Date: 09/08/2020   PT End of Session - 09/08/20 1704    Visit Number 2    Date for PT Re-Evaluation 01/02/21    Authorization Type BCBS    Authorization - Visit Number 2    Authorization - Number of Visits 30    PT Start Time 3299    PT Stop Time 2426    PT Time Calculation (min) 39 min    Activity Tolerance Patient tolerated treatment well;No increased pain    Behavior During Therapy WFL for tasks assessed/performed           Past Medical History:  Diagnosis Date  . Acne    sees dermatologist  . Anxiety and depression   . Ehlers-Danlos syndrome    per pt dx with her rheuamtologist  (joint hypermobility, connective tissue disorder)  . Endometriosis   . Fibromyalgia   . History of concussion    2009-- no residual  . History of melasma    skin depigmentation  . Interstitial cystitis   . Migraine   . Osteoarthritis    jaw, left ankle, knees  . Panic disorder    hx pain attacks  . PCOS (polycystic ovarian syndrome)   . Pelvic pain in female   . Seasonal allergies   . Subclinical hyperthyroidism    sees endocrinologist--- DR Cruzita Lederer  . TMJ syndrome     Past Surgical History:  Procedure Laterality Date  . ANKLE RECONSTRUCTION Left 04/08/2014   Procedure: RECONSTRUCTION LEFT ANKLE, REPAIR SECONDARY DISRUPTED LIGAMENT ANKLE COLLATERAL, TRANSPLANTERIOR/TRANSFER TENDON TIBIAL EXTENSOR INTO MIDFOOT DEEP;  Surgeon: Yvette Rack., MD;  Location: [facility];  Service: Orthopedics;  Laterality: Left;  . ARTHROTOMY Bilateral 12/23/2013   Procedure: BILATERAL ARTHROTOMY, MENISECTOMY, TEMPORALIS FASCIA GRAFT;  Surgeon: Gae Bon, DDS;  Location: [facility];  Service: Oral Surgery;  Laterality: Bilateral;   TMJ  . LAPAROSCOPY N/A 06/24/2013   Procedure: LAPAROSCOPY OPERATIVE WITH CHROMOPERTUBATION, LYSIS OF ADHESIONS AND APPENDECTOMY ;  Surgeon: Linda Hedges, DO;  Location: [facility];  Service: Gynecology;  Laterality: N/A;  . LAPAROSCOPY N/A 08/07/2016   Procedure: LAPAROSCOPY OPERATIVE, FULGERATION  OF ENDOMETRIOSIS ;  Surgeon: Linda Hedges, DO;  Location: [facility];  Service: Gynecology;  Laterality: N/A;  . LIGAMENT REPAIR Right 01/13/2014   Procedure: RIGHT ANKLE REPAIR SECONDAY DISRUPTED LIGAMENT ANKLE  COLLATERAL WITH PERONEAL TENDON AUTOGRAFT;  Surgeon: Yvette Rack., MD;  Location: [facility];  Service: Orthopedics;  Laterality: Right;  . TMJ ARTHROCENTESIS Bilateral 07-04-2012   w/  IV Sedation in office  . [place] EXTRACTION  2004    There were no vitals filed for this visit.   Subjective Assessment - 09/08/20 1618    Subjective I felt more muscle activation after last visit. I have a home TENS unit.    Patient Stated Goals Back to feeling better and get to baseline of where she is.    Currently in Pain? Yes    Pain Score 10-Worst pain ever    Pain Location Pelvis    Pain Orientation Mid;Lower    Pain Descriptors / Indicators Aching;Dull;Burning;Pressure;Stabbing    Pain Type Chronic pain  Pain Onset More than a month ago    Pain Frequency Constant    Aggravating Factors  urinating, sexual stimulation, walking for length of time, riding in a car, sitting that is not on a soft cushion    Pain Relieving Factors sitting on a soft cushion, hot shower and bath, sleep in hammock    Multiple Pain Sites Yes    Pain Score 10    Pain Location Back    Pain Orientation Mid;Upper;Lower    Pain Descriptors / Indicators Cramping    Pain Type Chronic pain    Pain Onset More than a month ago    Pain Frequency Constant    Aggravating Factors  physical activity that is not  soft an still, sleeping funny    Pain Relieving Factors muscle release, laying down, hot showerl                             [facility] Adult PT Treatment/Exercise - 09/08/20 0001      Self-Care   Self-Care Other Self-Care Comments    Other Self-Care Comments  eduucated patient on pelvic wand to work on trigger points, using desert harvest releleum with lidocaine to reduce pain with manual work vaginally; edcuated pateint on pelvic floor model on how to perform self perineal massage externally      Manual Therapy   Manual Therapy Myofascial release    Myofascial Release fascial release along the urogenital diaphgram going through the 3 planes of fascia and educated patient on how to perform at home; fascial work to bilateral ITB, quadriceps, hip adductors to reduce sensitivity to touch and improve tissue mobility                  PT Education - 09/08/20 1703    Education Details information on vaginal wand, how to perform perineal massage with desert harvest reveleum samples; gave aloe vera capsule samples for IBS    Person(s) Educated Patient    Methods Explanation;Demonstration    Comprehension Verbalized understanding            PT Short Term Goals - 09/02/20 1711      PT SHORT TERM GOAL #1   Title independent with initial HEP    Time 4    Period Weeks    Status New    Target Date 09/30/20      PT SHORT TERM GOAL #2   Title pt will be able to perform self massage to perineum for improved soft tissue length    Time 4    Period Weeks    Status New    Target Date 09/30/20      PT SHORT TERM GOAL #3   Title pt will report feeling like she can relax more due to a decrease in the sensation of "everything falling out"    Time 4    Period Weeks    Status New    Target Date 09/30/20      PT SHORT TERM GOAL #4   Title pt able to correctly engage transverse abdominus muscle for improved stability during functional activities    Time 4    Period Weeks     Status On-going    Target Date 09/30/20      PT SHORT TERM GOAL #5   Title pt will be able to tolerate internal soft tissue release throughout pelvic floor    Time 4    Period Weeks  Status New    Target Date 09/30/20             PT Long Term Goals - 09/02/20 1712      PT LONG TERM GOAL #1   Title patient will be able to engage in sexual stimulation with her partner due to reduction of pain    Time 4    Period Months    Status New    Target Date 01/02/21      PT LONG TERM GOAL #2   Title pt will report overall 50% reduction of symptoms due to improved control of contracting and relaxing pelvic floor muscles.    Baseline ---    Time 4    Period Months    Status New    Target Date 01/02/21      PT LONG TERM GOAL #3   Title pt will be confident with pain management at home using advanced HEP, pelvic wand and electrical stimulation    Baseline ---    Time 4    Period Months    Status New    Target Date 01/02/21      PT LONG TERM GOAL #4   Title burning with urination decreased >/= 50% due to reduction of pelvic floor tension    Baseline ---    Time 4    Period Months    Status New    Target Date 01/02/21                 Plan - 09/08/20 1704    Clinical Impression Statement Patient had decreased pain after therapy. Patient did well with therapist performing hands on therapy. She was educated on perineal massage and will do at home. Patient was educated on looking into vaginal wand to perform her manual work to the pelvic floor. Patient will use her Home TENS unit to manage pain. Patient has not met goals due to just starting therapy. Patient will benefit from skillled therapy to reduce pain and improve movement.    Personal Factors and Comorbidities Fitness;Comorbidity 3+    Comorbidities Endometriosis; Fibromyalgia; Ehlers-Danlos, IC    Examination-Activity Limitations Locomotion Level;Transfers;Lift;Stand;Squat;Sleep;Sit;Bed Mobility     Examination-Participation Restrictions Occupation;Community Activity;Driving;Interpersonal Relationship;Laundry;Shop;Cleaning;Meal Prep    Stability/Clinical Decision Making Evolving/Moderate complexity    Rehab Potential Good    PT Frequency 2x / week    PT Duration Other (comment)   4 months   PT Treatment/Interventions ADLs/Self Care Home Management;Biofeedback;Cryotherapy;Electrical Stimulation;Moist Heat;Ultrasound;Neuromuscular re-education;Therapeutic exercise;Therapeutic activities;Patient/family education;Manual techniques;Passive range of motion;Dry needling;Energy conservation;Taping;Spinal Manipulations;Joint Manipulations    PT Next Visit Plan diaphramatic breathing for downtraining, manual work to [employer] and outside of pelvic floor, stretches, dry needling to pelvic floor, manual work to the vulva area    Consulted and Agree with Plan of Care Patient           Patient will benefit from skilled therapeutic intervention in order to improve the following deficits and impairments:  Decreased coordination,Increased fascial restricitons,Decreased endurance,Decreased activity tolerance,Increased muscle spasms,Pain,Decreased strength  Visit Diagnosis: Muscle weakness (generalized)  Other lack of coordination  Chronic bilateral low back pain without sciatica  Pelvic pain     Problem List Patient Active Problem List   Diagnosis Date Noted  . BMI 32.0-32.9,adult 10/01/2014  . Anxiety, Depression, Insomnia, Panic disorder 10/01/2014  . Ehlers-Danlos disease 05/19/2014  . Subclinical hyperthyroidism 02/03/2014  . Acne 11/14/2013  . Low serum progesterone 11/14/2013  . Fibromyalgia 04/04/2012  . TMJ (temporomandibular joint syndrome) 04/04/2012  . Migraines 04/04/2012  Earlie Counts, PT 09/08/20 5:14 PM   [facility] [address] [place], [place], [address] Phone: [phone]   Fax:  [phone]  Name:  Casey Cox MRN: [id number] Date of Birth: [birth year]

## 2020-10-14 ENCOUNTER — Ambulatory Visit: Payer: BC Managed Care – PPO | Attending: Obstetrics & Gynecology | Admitting: Physical Therapy

## 2020-10-14 ENCOUNTER — Encounter: Payer: Self-pay | Admitting: Physical Therapy

## 2020-10-14 ENCOUNTER — Other Ambulatory Visit: Payer: Self-pay

## 2020-10-14 DIAGNOSIS — M6281 Muscle weakness (generalized): Secondary | ICD-10-CM | POA: Diagnosis not present

## 2020-10-14 DIAGNOSIS — M545 Low back pain, unspecified: Secondary | ICD-10-CM | POA: Diagnosis present

## 2020-10-14 DIAGNOSIS — R102 Pelvic and perineal pain: Secondary | ICD-10-CM | POA: Diagnosis present

## 2020-10-14 DIAGNOSIS — G8929 Other chronic pain: Secondary | ICD-10-CM

## 2020-10-14 DIAGNOSIS — R278 Other lack of coordination: Secondary | ICD-10-CM

## 2020-10-14 NOTE — Therapy (Signed)
[facility] [address] [place], [place], [address] Phone: [phone]   Fax:  [phone]  Physical Therapy Treatment  Patient Details  Name: Casey Cox MRN: [id number] Date of Birth: 11/14/1975 Referring Provider (PT): Dr. Iona Coach Dasouki Abu-Alnadi   Encounter Date: 10/14/2020   PT End of Session - 10/14/20 1544    Visit Number 3    Date for PT Re-Evaluation 01/02/21    Authorization Type BCBS    Authorization - Visit Number 3    Authorization - Number of Visits 30    PT Start Time 6270    PT Stop Time 3500    PT Time Calculation (min) 50 min    Activity Tolerance Patient tolerated treatment well;No increased pain    Behavior During Therapy WFL for tasks assessed/performed           Past Medical History:  Diagnosis Date  . Acne    sees dermatologist  . Anxiety and depression   . Ehlers-Danlos syndrome    per pt dx with her rheuamtologist  (joint hypermobility, connective tissue disorder)  . Endometriosis   . Fibromyalgia   . History of concussion    2009-- no residual  . History of melasma    skin depigmentation  . Interstitial cystitis   . Migraine   . Osteoarthritis    jaw, left ankle, knees  . Panic disorder    hx pain attacks  . PCOS (polycystic ovarian syndrome)   . Pelvic pain in female   . Seasonal allergies   . Subclinical hyperthyroidism    sees endocrinologist--- DR Cruzita Lederer  . TMJ syndrome     Past Surgical History:  Procedure Laterality Date  . ANKLE RECONSTRUCTION Left 04/08/2014   Procedure: RECONSTRUCTION LEFT ANKLE, REPAIR SECONDARY DISRUPTED LIGAMENT ANKLE COLLATERAL, TRANSPLANTERIOR/TRANSFER TENDON TIBIAL EXTENSOR INTO MIDFOOT DEEP;  Surgeon: Yvette Rack., MD;  Location: [facility];  Service: Orthopedics;  Laterality: Left;  . ARTHROTOMY Bilateral 12/23/2013   Procedure: BILATERAL ARTHROTOMY, MENISECTOMY, TEMPORALIS FASCIA GRAFT;  Surgeon: Gae Bon, DDS;  Location: [facility];  Service: Oral Surgery;  Laterality: Bilateral;   TMJ  . LAPAROSCOPY N/A 06/24/2013   Procedure: LAPAROSCOPY OPERATIVE WITH CHROMOPERTUBATION, LYSIS OF ADHESIONS AND APPENDECTOMY ;  Surgeon: Linda Hedges, DO;  Location: [facility];  Service: Gynecology;  Laterality: N/A;  . LAPAROSCOPY N/A 08/07/2016   Procedure: LAPAROSCOPY OPERATIVE, FULGERATION  OF ENDOMETRIOSIS ;  Surgeon: Linda Hedges, DO;  Location: [facility];  Service: Gynecology;  Laterality: N/A;  . LIGAMENT REPAIR Right 01/13/2014   Procedure: RIGHT ANKLE REPAIR SECONDAY DISRUPTED LIGAMENT ANKLE  COLLATERAL WITH PERONEAL TENDON AUTOGRAFT;  Surgeon: Yvette Rack., MD;  Location: [facility];  Service: Orthopedics;  Laterality: Right;  . TMJ ARTHROCENTESIS Bilateral 07-04-2012   w/  IV Sedation in office  . [place] EXTRACTION  2004    There were no vitals filed for this visit.   Subjective Assessment - 10/14/20 1448    Subjective I feel the same. I have focused on doing my exercises. I can withstand increased pressure externally. I have not used a wand yet. Feels like everything is falling out. I feel weak and tight in the pelvic floor. The bladder is not listening to her. No leakage. The bladder feels like it takes a long time to empty. I have a weaker stream. Less nerve pain in back.    Patient Stated Goals Back to  feeling better and get to baseline of where she is.    Currently in Pain? Yes    Pain Score 8     Pain Location Pelvis    Pain Orientation Mid;Lower    Pain Descriptors / Indicators Aching;Burning;Dull;Pressure;Stabbing    Pain Type Chronic pain    Pain Onset More than a month ago    Pain Frequency Constant    Aggravating Factors  urinating, sexual stimulation, walking for length of time, riding in a car, sitting that is not on a soft cushion    Pain Relieving Factors sitting on a soft cushion hot shower and bath, sleep in hammock    Pain Score 8    Pain  Location Back    Pain Orientation Upper;Lower;Mid    Pain Descriptors / Indicators Cramping    Pain Type Chronic pain    Pain Onset More than a month ago    Pain Frequency Constant    Aggravating Factors  physical activity that is not soft and still, sleeping funny    Pain Relieving Factors muscle release, laying down, hot shower                             [facility] Adult PT Treatment/Exercise - 10/14/20 0001      Neuro Re-ed    Neuro Re-ed Details  diaphragmatic breathing with therapist helping her to lengthen her pelvic floor and view it as a flower that is opening and changing colors      Manual Therapy   Manual Therapy Myofascial release;Soft tissue mobilization;Internal Pelvic Floor    Myofascial Release fascial release to the inner thigh, quadriceps, along the groin, along the labia majora and minora    Internal Pelvic Floor gentle manual mobization to the perineal body and elongate the tissue while monitoring for pain                  PT Education - 10/14/20 1543    Education Details continue to work on her diaphragmatic breathing and relaxing her pelvic floor    Person(s) Educated Patient    Methods Explanation;Demonstration    Comprehension Verbalized understanding;Returned demonstration            PT Short Term Goals - 10/14/20 1547      PT SHORT TERM GOAL #1   Title independent with initial HEP    Time 4    Period Weeks    Status On-going      PT SHORT TERM GOAL #2   Title pt will be able to perform self massage to perineum for improved soft tissue length    Time 4    Period Weeks    Status On-going      PT SHORT TERM GOAL #3   Title pt will report feeling like she can relax more due to a decrease in the sensation of "everything falling out"    Time 4    Period Weeks    Status On-going      PT SHORT TERM GOAL #4   Title pt able to correctly engage transverse abdominus muscle for improved stability during functional activities     Time 4    Period Weeks    Status On-going      PT SHORT TERM GOAL #5   Title pt will be able to tolerate internal soft tissue release throughout pelvic floor    Time 4    Period Weeks  Status On-going             PT Long Term Goals - 09/02/20 1712      PT LONG TERM GOAL #1   Title patient will be able to engage in sexual stimulation with her partner due to reduction of pain    Time 4    Period Months    Status New    Target Date 01/02/21      PT LONG TERM GOAL #2   Title pt will report overall 50% reduction of symptoms due to improved control of contracting and relaxing pelvic floor muscles.    Baseline ---    Time 4    Period Months    Status New    Target Date 01/02/21      PT LONG TERM GOAL #3   Title pt will be confident with pain management at home using advanced HEP, pelvic wand and electrical stimulation    Baseline ---    Time 4    Period Months    Status New    Target Date 01/02/21      PT LONG TERM GOAL #4   Title burning with urination decreased >/= 50% due to reduction of pelvic floor tension    Baseline ---    Time 4    Period Months    Status New    Target Date 01/02/21                 Plan - 10/14/20 1544    Clinical Impression Statement Patient was able to let the therapist work on the perineal body with the therapist index finger to the DIP joint for the first time. Patient is having trouble with relaxing her pelvic floor with diaphragmatic breathing. Patient is trying to perform manual work to the perineum but still has difficulty. Patient will benefit from skilled therapy to reduce pain and improve movement.    Personal Factors and Comorbidities Fitness;Comorbidity 3+    Comorbidities Endometriosis; Fibromyalgia; Ehlers-Danlos, IC    Examination-Activity Limitations Locomotion Level;Transfers;Lift;Stand;Squat;Sleep;Sit;Bed Mobility    Examination-Participation Restrictions Occupation;Community Activity;Driving;Interpersonal  Relationship;Laundry;Shop;Cleaning;Meal Prep    Stability/Clinical Decision Making Evolving/Moderate complexity    Rehab Potential Good    PT Frequency 2x / week    PT Duration Other (comment)   4 months   PT Treatment/Interventions ADLs/Self Care Home Management;Biofeedback;Cryotherapy;Electrical Stimulation;Moist Heat;Ultrasound;Neuromuscular re-education;Therapeutic exercise;Therapeutic activities;Patient/family education;Manual techniques;Passive range of motion;Dry needling;Energy conservation;Taping;Spinal Manipulations;Joint Manipulations    PT Next Visit Plan diaphramatic breathing for downtraining, manual work to [employer] and outside of pelvic floor, stretches, dry needling to pelvic floor, manual work to the vulva area    Recommended Other Services MD signed initial eval    Consulted and Agree with Plan of Care Patient           Patient will benefit from skilled therapeutic intervention in order to improve the following deficits and impairments:  Decreased coordination,Increased fascial restricitons,Decreased endurance,Decreased activity tolerance,Increased muscle spasms,Pain,Decreased strength  Visit Diagnosis: Muscle weakness (generalized)  Other lack of coordination  Chronic bilateral low back pain without sciatica  Pelvic pain     Problem List Patient Active Problem List   Diagnosis Date Noted  . BMI 32.0-32.9,adult 10/01/2014  . Anxiety, Depression, Insomnia, Panic disorder 10/01/2014  . Ehlers-Danlos disease 05/19/2014  . Subclinical hyperthyroidism 02/03/2014  . Acne 11/14/2013  . Low serum progesterone 11/14/2013  . Fibromyalgia 04/04/2012  . TMJ (temporomandibular joint syndrome) 04/04/2012  . Migraines 04/04/2012    Earlie Counts, PT 10/14/20 3:49  PM   [facility] [address] [place], [place], [address] Phone: [phone]   Fax:  [phone]  Name: Casey Cox MRN:  [id number] Date of Birth: Oct 17, 1975

## 2020-10-18 ENCOUNTER — Encounter: Payer: BC Managed Care – PPO | Admitting: Physical Therapy

## 2020-10-21 ENCOUNTER — Encounter: Payer: Self-pay | Admitting: Physical Therapy

## 2020-10-21 ENCOUNTER — Ambulatory Visit: Payer: BC Managed Care – PPO | Admitting: Physical Therapy

## 2020-10-21 ENCOUNTER — Other Ambulatory Visit: Payer: Self-pay

## 2020-10-21 DIAGNOSIS — M6281 Muscle weakness (generalized): Secondary | ICD-10-CM

## 2020-10-21 DIAGNOSIS — R102 Pelvic and perineal pain: Secondary | ICD-10-CM

## 2020-10-21 DIAGNOSIS — G8929 Other chronic pain: Secondary | ICD-10-CM

## 2020-10-21 DIAGNOSIS — R278 Other lack of coordination: Secondary | ICD-10-CM

## 2020-10-21 NOTE — Therapy (Signed)
[facility] [address], [place], [place], [address] Phone: [phone]   Fax:  [phone]  Physical Therapy Treatment  Patient Details  Name: Casey Cox MRN: [id number] Date of Birth: Jan 25, 1976 Referring Provider (PT): Dr. Iona Coach Dasouki Abu-Alnadi   Encounter Date: 10/21/2020   PT End of Session - 10/21/20 1233    Visit Number 4    Date for PT Re-Evaluation 01/02/21    Authorization Type BCBS    Authorization - Visit Number 4    Authorization - Number of Visits 30    PT Start Time 6962    PT Stop Time 9528    PT Time Calculation (min) 38 min    Activity Tolerance Patient tolerated treatment well;No increased pain    Behavior During Therapy WFL for tasks assessed/performed           Past Medical History:  Diagnosis Date  . Acne    sees dermatologist  . Anxiety and depression   . Ehlers-Danlos syndrome    per pt dx with her rheuamtologist  (joint hypermobility, connective tissue disorder)  . Endometriosis   . Fibromyalgia   . History of concussion    2009-- no residual  . History of melasma    skin depigmentation  . Interstitial cystitis   . Migraine   . Osteoarthritis    jaw, left ankle, knees  . Panic disorder    hx pain attacks  . PCOS (polycystic ovarian syndrome)   . Pelvic pain in female   . Seasonal allergies   . Subclinical hyperthyroidism    sees endocrinologist--- DR Cruzita Lederer  . TMJ syndrome     Past Surgical History:  Procedure Laterality Date  . ANKLE RECONSTRUCTION Left 04/08/2014   Procedure: RECONSTRUCTION LEFT ANKLE, REPAIR SECONDARY DISRUPTED LIGAMENT ANKLE COLLATERAL, TRANSPLANTERIOR/TRANSFER TENDON TIBIAL EXTENSOR INTO MIDFOOT DEEP;  Surgeon: Yvette Rack., MD;  Location: [place];  Service: Orthopedics;  Laterality: Left;  . ARTHROTOMY Bilateral 12/23/2013   Procedure: BILATERAL ARTHROTOMY, MENISECTOMY, TEMPORALIS FASCIA GRAFT;  Surgeon: Gae Bon, DDS;  Location: [place];  Service: Oral Surgery;  Laterality: Bilateral;   TMJ  . LAPAROSCOPY N/A 06/24/2013   Procedure: LAPAROSCOPY OPERATIVE WITH CHROMOPERTUBATION, LYSIS OF ADHESIONS AND APPENDECTOMY ;  Surgeon: Linda Hedges, DO;  Location: [facility];  Service: Gynecology;  Laterality: N/A;  . LAPAROSCOPY N/A 08/07/2016   Procedure: LAPAROSCOPY OPERATIVE, FULGERATION  OF ENDOMETRIOSIS ;  Surgeon: Linda Hedges, DO;  Location: [place];  Service: Gynecology;  Laterality: N/A;  . LIGAMENT REPAIR Right 01/13/2014   Procedure: RIGHT ANKLE REPAIR SECONDAY DISRUPTED LIGAMENT ANKLE  COLLATERAL WITH PERONEAL TENDON AUTOGRAFT;  Surgeon: Yvette Rack., MD;  Location: [place];  Service: Orthopedics;  Laterality: Right;  . TMJ ARTHROCENTESIS Bilateral 07-04-2012   w/  IV Sedation in office  . [place] EXTRACTION  2004    There were no vitals filed for this visit.   Subjective Assessment - 10/21/20 1235    Subjective It helped to be more relaxed and it lasted a little longer. Last visit was very helpful. I showed my wife on what to do.    Patient Stated Goals Back to feeling better and get to baseline of where she is.    Currently in Pain? Yes    Pain Score 10-Worst pain ever    Pain Location Pelvis    Pain Orientation Mid;Lower    Pain Descriptors / Indicators  Aching;Burning;Dull;Pressure;Stabbing    Pain Type Chronic pain    Pain Onset More than a month ago    Pain Frequency Constant    Aggravating Factors  urinating, sexual stimulation, wakling for length of time, riding in a car, siting that is not on a soft cushion    Pain Relieving Factors sitting on a soft cushion, hot shower and bath, sleep in hammock    Pain Score 10    Pain Location Back    Pain Orientation Upper;Lower;Mid    Pain Descriptors / Indicators Cramping    Pain Type Chronic pain    Pain Onset More than a month ago    Pain Frequency Constant    Aggravating Factors  physical  activity that is not soft and still, sleeping funny    Pain Relieving Factors muscle release, laying down, hot shower                             [facility] Adult PT Treatment/Exercise - 10/21/20 0001      Manual Therapy   Manual Therapy Myofascial release    Myofascial Release craniosacral work to the occiput to work on the fluid and have it flow correctly; release of the thoracic inlet with hands on the shoulder and chest; release of the urogenital system with one hand on the sacrum and abdomen to elongate the spine and abdomen; leg pull to unwind the fascial system                    PT Short Term Goals - 10/14/20 1547      PT SHORT TERM GOAL #1   Title independent with initial HEP    Time 4    Period Weeks    Status On-going      PT SHORT TERM GOAL #2   Title pt will be able to perform self massage to perineum for improved soft tissue length    Time 4    Period Weeks    Status On-going      PT SHORT TERM GOAL #3   Title pt will report feeling like she can relax more due to a decrease in the sensation of "everything falling out"    Time 4    Period Weeks    Status On-going      PT SHORT TERM GOAL #4   Title pt able to correctly engage transverse abdominus muscle for improved stability during functional activities    Time 4    Period Weeks    Status On-going      PT SHORT TERM GOAL #5   Title pt will be able to tolerate internal soft tissue release throughout pelvic floor    Time 4    Period Weeks    Status On-going             PT Long Term Goals - 09/02/20 1712      PT LONG TERM GOAL #1   Title patient will be able to engage in sexual stimulation with her partner due to reduction of pain    Time 4    Period Months    Status New    Target Date 01/02/21      PT LONG TERM GOAL #2   Title pt will report overall 50% reduction of symptoms due to improved control of contracting and relaxing pelvic floor muscles.    Baseline ---    Time  4  Period Months    Status New    Target Date 01/02/21      PT LONG TERM GOAL #3   Title pt will be confident with pain management at home using advanced HEP, pelvic wand and electrical stimulation    Baseline ---    Time 4    Period Months    Status New    Target Date 01/02/21      PT LONG TERM GOAL #4   Title burning with urination decreased >/= 50% due to reduction of pelvic floor tension    Baseline ---    Time 4    Period Months    Status New    Target Date 01/02/21                 Plan - 10/21/20 1233    Clinical Impression Statement Patient pain decreased by 1 level after the manual work. She was having one of the worst pain days and on her cycle. Patient was able to relax her hands and had less wrinkles in her face due ot lesss pain. Patient was more relaxed. Patient did not have manual work to the pelvic floor due to being on her cycle. Patient will benefit from skilled therapy to reduce pain and improve movement.    Personal Factors and Comorbidities Fitness;Comorbidity 3+    Comorbidities Endometriosis; Fibromyalgia; Ehlers-Danlos, IC    Examination-Activity Limitations Locomotion Level;Transfers;Lift;Stand;Squat;Sleep;Sit;Bed Mobility    Examination-Participation Restrictions Occupation;Community Activity;Driving;Interpersonal Relationship;Laundry;Shop;Cleaning;Meal Prep    Stability/Clinical Decision Making Evolving/Moderate complexity    Rehab Potential Good    PT Frequency 2x / week    PT Duration Other (comment)   4 months   PT Treatment/Interventions ADLs/Self Care Home Management;Biofeedback;Cryotherapy;Electrical Stimulation;Moist Heat;Ultrasound;Neuromuscular re-education;Therapeutic exercise;Therapeutic activities;Patient/family education;Manual techniques;Passive range of motion;Dry needling;Energy conservation;Taping;Spinal Manipulations;Joint Manipulations    PT Next Visit Plan diaphramatic breathing for downtraining, manual work to [employer] and  outside of pelvic floor, stretches, dry needling to pelvic floor, manual work to the vulva area    Consulted and Agree with Plan of Care Patient           Patient will benefit from skilled therapeutic intervention in order to improve the following deficits and impairments:  Decreased coordination,Increased fascial restricitons,Decreased endurance,Decreased activity tolerance,Increased muscle spasms,Pain,Decreased strength  Visit Diagnosis: Muscle weakness (generalized)  Other lack of coordination  Chronic bilateral low back pain without sciatica  Pelvic pain     Problem List Patient Active Problem List   Diagnosis Date Noted  . BMI 32.0-32.9,adult 10/01/2014  . Anxiety, Depression, Insomnia, Panic disorder 10/01/2014  . Ehlers-Danlos disease 05/19/2014  . Subclinical hyperthyroidism 02/03/2014  . Acne 11/14/2013  . Low serum progesterone 11/14/2013  . Fibromyalgia 04/04/2012  . TMJ (temporomandibular joint syndrome) 04/04/2012  . Migraines 04/04/2012    Earlie Counts, PT 10/21/20 1:20 PM   [facility] [address], [place], [place], [address] Phone: [phone]   Fax:  [phone]  Name: MARRA FRAGA MRN: [id number] Date of Birth: April 05, 1976

## 2020-10-28 ENCOUNTER — Encounter: Payer: Self-pay | Admitting: Physical Therapy

## 2020-10-28 ENCOUNTER — Other Ambulatory Visit: Payer: Self-pay

## 2020-10-28 ENCOUNTER — Ambulatory Visit: Payer: BC Managed Care – PPO | Attending: Obstetrics & Gynecology | Admitting: Physical Therapy

## 2020-10-28 DIAGNOSIS — M6281 Muscle weakness (generalized): Secondary | ICD-10-CM

## 2020-10-28 DIAGNOSIS — R278 Other lack of coordination: Secondary | ICD-10-CM | POA: Insufficient documentation

## 2020-10-28 DIAGNOSIS — R102 Pelvic and perineal pain: Secondary | ICD-10-CM | POA: Diagnosis present

## 2020-10-28 DIAGNOSIS — M545 Low back pain, unspecified: Secondary | ICD-10-CM | POA: Diagnosis present

## 2020-10-28 DIAGNOSIS — G8929 Other chronic pain: Secondary | ICD-10-CM

## 2020-10-28 NOTE — Therapy (Signed)
[facility] [address] [place], [place], [address] Phone: [phone]   Fax:  [phone]  Physical Therapy Treatment  Patient Details  Name: Casey Cox MRN: [id number] Date of Birth: 1976-03-12 Referring Provider (PT): Dr. Iona Coach Dasouki Abu-Alnadi   Encounter Date: 10/28/2020   PT End of Session - 10/28/20 1551    Visit Number 5    Date for PT Re-Evaluation 01/02/21    Authorization Type BCBS    Authorization - Visit Number 5    Authorization - Number of Visits 30    PT Start Time 0867    PT Stop Time 6195    PT Time Calculation (min) 40 min    Activity Tolerance Patient tolerated treatment well;No increased pain    Behavior During Therapy WFL for tasks assessed/performed           Past Medical History:  Diagnosis Date  . Acne    sees dermatologist  . Anxiety and depression   . Ehlers-Danlos syndrome    per pt dx with her rheuamtologist  (joint hypermobility, connective tissue disorder)  . Endometriosis   . Fibromyalgia   . History of concussion    2009-- no residual  . History of melasma    skin depigmentation  . Interstitial cystitis   . Migraine   . Osteoarthritis    jaw, left ankle, knees  . Panic disorder    hx pain attacks  . PCOS (polycystic ovarian syndrome)   . Pelvic pain in female   . Seasonal allergies   . Subclinical hyperthyroidism    sees endocrinologist--- DR Cruzita Lederer  . TMJ syndrome     Past Surgical History:  Procedure Laterality Date  . ANKLE RECONSTRUCTION Left 04/08/2014   Procedure: RECONSTRUCTION LEFT ANKLE, REPAIR SECONDARY DISRUPTED LIGAMENT ANKLE COLLATERAL, TRANSPLANTERIOR/TRANSFER TENDON TIBIAL EXTENSOR INTO MIDFOOT DEEP;  Surgeon: Yvette Rack., MD;  Location: [place];  Service: Orthopedics;  Laterality: Left;  . ARTHROTOMY Bilateral 12/23/2013   Procedure: BILATERAL ARTHROTOMY, MENISECTOMY, TEMPORALIS FASCIA GRAFT;  Surgeon: Gae Bon, DDS;  Location: [place];  Service: Oral Surgery;  Laterality: Bilateral;   TMJ  . LAPAROSCOPY N/A 06/24/2013   Procedure: LAPAROSCOPY OPERATIVE WITH CHROMOPERTUBATION, LYSIS OF ADHESIONS AND APPENDECTOMY ;  Surgeon: Linda Hedges, DO;  Location: [facility];  Service: Gynecology;  Laterality: N/A;  . LAPAROSCOPY N/A 08/07/2016   Procedure: LAPAROSCOPY OPERATIVE, FULGERATION  OF ENDOMETRIOSIS ;  Surgeon: Linda Hedges, DO;  Location: [place];  Service: Gynecology;  Laterality: N/A;  . LIGAMENT REPAIR Right 01/13/2014   Procedure: RIGHT ANKLE REPAIR SECONDAY DISRUPTED LIGAMENT ANKLE  COLLATERAL WITH PERONEAL TENDON AUTOGRAFT;  Surgeon: Yvette Rack., MD;  Location: [place];  Service: Orthopedics;  Laterality: Right;  . TMJ ARTHROCENTESIS Bilateral 07-04-2012   w/  IV Sedation in office  . [place] EXTRACTION  2004    There were no vitals filed for this visit.   Subjective Assessment - 10/28/20 1535    Subjective I felt okay after last visit. I had some of the lidocain cream still. I am having an endo flare right now. I need another surgery to remove the eno that is growing. All the muscle sof my abdomen is tight.    Patient Stated Goals Back to feeling better and get to baseline of where she is.    Currently in Pain? Yes    Pain Score 9     Pain  Location Pelvis    Pain Orientation Mid;Lower    Pain Descriptors / Indicators Aching;Burning;Dull;Pressure;Stabbing    Pain Type Chronic pain    Pain Onset More than a month ago    Pain Frequency Constant    Aggravating Factors  urniating, sexual stimulation, walking for length of time, riding in a car, sitting that is not on a soft cushion    Pain Relieving Factors sitting on soft cushion, hot shower and bath, sleep in hammock    Multiple Pain Sites Yes    Pain Location Back    Pain Orientation Upper;Lower;Mid    Pain Descriptors / Indicators Cramping    Pain Type Chronic pain    Pain Onset More than a  month ago    Pain Frequency Constant    Aggravating Factors  physical activity that is not soft and still sleeping funny    Pain Relieving Factors muscle release laying down, hot shower                             [facility] Adult PT Treatment/Exercise - 10/28/20 0001      Manual Therapy   Manual Therapy Myofascial release;Soft tissue mobilization    Myofascial Release fascial release of the urogenital diaphragm with one hand on the abdomen and other on the sacrum going through all the planes; fascial release around the labias, proximal hip adductors and along the perineal body while monitoring for pain and increased breath rate                    PT Short Term Goals - 10/28/20 1614      PT SHORT TERM GOAL #1   Title independent with initial HEP    Period Weeks    Status Achieved      PT SHORT TERM GOAL #2   Title pt will be able to perform self massage to perineum for improved soft tissue length    Time 4    Period Weeks    Status Achieved             PT Long Term Goals - 09/02/20 1712      PT LONG TERM GOAL #1   Title patient will be able to engage in sexual stimulation with her partner due to reduction of pain    Time 4    Period Months    Status New    Target Date 01/02/21      PT LONG TERM GOAL #2   Title pt will report overall 50% reduction of symptoms due to improved control of contracting and relaxing pelvic floor muscles.    Baseline ---    Time 4    Period Months    Status New    Target Date 01/02/21      PT LONG TERM GOAL #3   Title pt will be confident with pain management at home using advanced HEP, pelvic wand and electrical stimulation    Baseline ---    Time 4    Period Months    Status New    Target Date 01/02/21      PT LONG TERM GOAL #4   Title burning with urination decreased >/= 50% due to reduction of pelvic floor tension    Baseline ---    Time 4    Period Months    Status New    Target Date 01/02/21  Plan - 10/28/20 1608    Clinical Impression Statement Patient was having an endo flar-up today with a distended belly and tightness. Patient was able to have the therapist work on the labia and perineal body for the first time. Patient left with her pain level decreased to 8/10. Patient was not using her cane today. Her partner is assisting her with the manual work to help with pain management. Patient was able to steady her breathing while having the manual work performed due to relaxing with greater ease. Patient will benefit from skilled therapy to reduce pain and improve movement.    Personal Factors and Comorbidities Fitness;Comorbidity 3+    Comorbidities Endometriosis; Fibromyalgia; Ehlers-Danlos, IC    Examination-Activity Limitations Locomotion Level;Transfers;Lift;Stand;Squat;Sleep;Sit;Bed Mobility    Examination-Participation Restrictions Occupation;Community Activity;Driving;Interpersonal Relationship;Laundry;Shop;Cleaning;Meal Prep    Stability/Clinical Decision Making Evolving/Moderate complexity    Rehab Potential Good    PT Frequency 2x / week    PT Duration Other (comment)   4 months   PT Treatment/Interventions ADLs/Self Care Home Management;Biofeedback;Cryotherapy;Electrical Stimulation;Moist Heat;Ultrasound;Neuromuscular re-education;Therapeutic exercise;Therapeutic activities;Patient/family education;Manual techniques;Passive range of motion;Dry needling;Energy conservation;Taping;Spinal Manipulations;Joint Manipulations    PT Next Visit Plan diaphramatic breathing for downtraining, manual work to [employer] and outside of pelvic floor, anual work to the vulva area    [employer] and Agree with Plan of Care Patient           Patient will benefit from skilled therapeutic intervention in order to improve the following deficits and impairments:  Decreased coordination,Increased fascial restricitons,Decreased endurance,Decreased activity tolerance,Increased muscle  spasms,Pain,Decreased strength  Visit Diagnosis: Muscle weakness (generalized)  Other lack of coordination  Chronic bilateral low back pain without sciatica  Pelvic pain     Problem List Patient Active Problem List   Diagnosis Date Noted  . BMI 32.0-32.9,adult 10/01/2014  . Anxiety, Depression, Insomnia, Panic disorder 10/01/2014  . Ehlers-Danlos disease 05/19/2014  . Subclinical hyperthyroidism 02/03/2014  . Acne 11/14/2013  . Low serum progesterone 11/14/2013  . Fibromyalgia 04/04/2012  . TMJ (temporomandibular joint syndrome) 04/04/2012  . Migraines 04/04/2012    Earlie Counts, PT 10/28/20 4:15 PM   [facility] [address] [place], [place], [address] Phone: [phone]   Fax:  [phone]  Name: Casey Cox MRN: [id number] Date of Birth: 13-Jul-1975

## 2020-11-03 ENCOUNTER — Encounter: Payer: BC Managed Care – PPO | Admitting: Physical Therapy

## 2020-11-05 ENCOUNTER — Ambulatory Visit: Payer: BC Managed Care – PPO | Admitting: Physical Therapy

## 2020-11-05 ENCOUNTER — Other Ambulatory Visit: Payer: Self-pay

## 2020-11-05 ENCOUNTER — Encounter: Payer: Self-pay | Admitting: Physical Therapy

## 2020-11-05 DIAGNOSIS — M6281 Muscle weakness (generalized): Secondary | ICD-10-CM | POA: Diagnosis not present

## 2020-11-05 DIAGNOSIS — G8929 Other chronic pain: Secondary | ICD-10-CM

## 2020-11-05 DIAGNOSIS — R102 Pelvic and perineal pain: Secondary | ICD-10-CM

## 2020-11-05 DIAGNOSIS — R278 Other lack of coordination: Secondary | ICD-10-CM

## 2020-11-05 NOTE — Therapy (Addendum)
[facility] [address] [place], [place], [address] Phone: [phone]   Fax:  [phone]  Physical Therapy Treatment  Patient Details  Name: Casey Cox MRN: [id number] Date of Birth: 21-Dec-1975 Referring Provider (PT): Dr. Concha Norway Dasouki Abu-Alnadi   Encounter Date: 11/05/2020   PT End of Session - 11/05/20 1058     Visit Number 6    Date for PT Re-Evaluation 01/02/21    Authorization Type BC[employer]    Authorization - Visit Number 6    Authorization - Number of Visits 30    PT Start Time 1015    PT Stop Time 1058    PT Time Calculation (min) 43 min    Activity Tolerance Patient tolerated treatment well;No increased pain    Behavior During Therapy WFL for tasks assessed/performed             Past Medical History:  Diagnosis Date   Acne    sees dermatologist   Anxiety and depression    Ehlers-Danlos syndrome    per pt dx with her rheuamtologist  (joint hypermobility, connective tissue disorder)   Endometriosis    Fibromyalgia    History of concussion    2009-- no residual   History of melasma    skin depigmentation   Interstitial cystitis    Migraine    Osteoarthritis    jaw, left ankle, knees   Panic disorder    hx pain attacks   PCOS (polycystic ovarian syndrome)    Pelvic pain in female    Seasonal allergies    Subclinical hyperthyroidism    sees endocrinologist--- DR Elvera Lennox   TMJ syndrome     Past Surgical History:  Procedure Laterality Date   ANKLE RECONSTRUCTION Left 04/08/2014   Procedure: RECONSTRUCTION LEFT ANKLE, REPAIR SECONDARY DISRUPTED LIGAMENT ANKLE COLLATERAL, TRANSPLANTERIOR/TRANSFER TENDON TIBIAL EXTENSOR INTO MIDFOOT DEEP;  Surgeon: Thera Flake., MD;  Location: [facility];  Service: Orthopedics;  Laterality: Left;   ARTHROTOMY Bilateral 12/23/2013   Procedure: BILATERAL ARTHROTOMY, MENISECTOMY, TEMPORALIS FASCIA GRAFT;  Surgeon: Georgia Lopes,  DDS;  Location: [facility];  Service: Oral Surgery;  Laterality: Bilateral;   TMJ   LAPAROSCOPY N/A 06/24/2013   Procedure: LAPAROSCOPY OPERATIVE WITH CHROMOPERTUBATION, LYSIS OF ADHESIONS AND APPENDECTOMY ;  Surgeon: Mitchel Honour, DO;  Location: [facility];  Service: Gynecology;  Laterality: N/A;   LAPAROSCOPY N/A 08/07/2016   Procedure: LAPAROSCOPY OPERATIVE, FULGERATION  OF ENDOMETRIOSIS ;  Surgeon: Mitchel Honour, DO;  Location: [facility];  Service: Gynecology;  Laterality: N/A;   LIGAMENT REPAIR Right 01/13/2014   Procedure: RIGHT ANKLE REPAIR SECONDAY DISRUPTED LIGAMENT ANKLE  COLLATERAL WITH PERONEAL TENDON AUTOGRAFT;  Surgeon: Thera Flake., MD;  Location: [facility];  Service: Orthopedics;  Laterality: Right;   TMJ ARTHROCENTESIS Bilateral 07-04-2012   w/  IV Sedation in office   WISDOM TOOTH EXTRACTION  2004    There were no vitals filed for this visit.   Subjective Assessment - 11/05/20 1017     Subjective No increased pain since last visit. I was able to do more of the massaging work on my own.    Patient Stated Goals Back to feeling better and get to baseline of where she is.    Currently in Pain? Yes    Pain Score 9     Pain Location Pelvis    Pain Orientation Mid;Lower    Pain Descriptors / Indicators Aching;Burning;Dull;Pressure;Stabbing  Pain Type Chronic pain    Pain Onset More than a month ago    Pain Frequency Constant    Aggravating Factors  urinating, sexual stimulation, walking for length of time, riding in a car, sitting that is not on a soft cushion    Pain Relieving Factors sitting on soft cushion, hot shower and bath, sleep in hammock    Pain Score 8    Pain Location Back    Pain Orientation Upper;Lower;Mid    Pain Descriptors / Indicators Cramping    Pain Type Chronic pain    Pain Onset More than a month ago    Pain Frequency Constant    Aggravating Factors  physical activity that is not soft and still sleeping funny    Pain  Relieving Factors muscle release layiing down, hot shower                               [facility] Adult PT Treatment/Exercise - 11/05/20 0001       Neuro Re-ed    Neuro Re-ed Details  diaphragmatic breathing to relax the pelvic floor      Manual Therapy   Manual Therapy Myofascial release    Myofascial Release along the quadricep, hip adductors, ITB, and hamstring; fascial release along bilateral ischiocavernosus and superficial transverse perineum feeling for the msucles to relax and monitor for pain                      PT Short Term Goals - 11/05/20 1155       PT SHORT TERM GOAL #1   Title independent with initial HEP    Time 4    Period Weeks    Status Achieved      PT SHORT TERM GOAL #2   Title pt will be able to perform self massage to perineum for improved soft tissue length    Time 4    Period Weeks    Status Achieved      PT SHORT TERM GOAL #3   Title pt will report feeling like she can relax more due to a decrease in the sensation of "everything falling out"    Time 4    Period Weeks    Status On-going    Target Date 09/30/20      PT SHORT TERM GOAL #4   Title pt able to correctly engage transverse abdominus muscle for improved stability during functional activities    Time 4    Period Weeks    Status On-going    Target Date 09/30/20      PT SHORT TERM GOAL #5   Title pt will be able to tolerate internal soft tissue release throughout pelvic floor    Baseline tolerating external perieum    Time 4    Period Weeks    Status On-going               PT Long Term Goals - 09/02/20 1712       PT LONG TERM GOAL #1   Title patient will be able to engage in sexual stimulation with her partner due to reduction of pain    Time 4    Period Months    Status New    Target Date 01/02/21      PT LONG TERM GOAL #2   Title pt will report overall 50% reduction of symptoms due to improved control of contracting and  relaxing pelvic  floor muscles.    Baseline ---    Time 4    Period Months    Status New    Target Date 01/02/21      PT LONG TERM GOAL #3   Title pt will be confident with pain management at home using advanced HEP, pelvic wand and electrical stimulation    Baseline ---    Time 4    Period Months    Status New    Target Date 01/02/21      PT LONG TERM GOAL #4   Title burning with urination decreased >/= 50% due to reduction of pelvic floor tension    Baseline ---    Time 4    Period Months    Status New    Target Date 01/02/21                   Plan - 11/05/20 1020     Clinical Impression Statement Patient pain level has not increased for 1 week. Her pain went down one level with the manual work. She does best with heat on her back while manual work is done to reduce pain and spasm. Patient was able to tolerate the therapist working on the ischiocavernosus and superficial transverse. She felt like she was expecting pain but did not having increased pain with the manual work. Patient is doing her HEP. Patient will benefit from skilled therapy to reduce pain and improve movement.    Personal Factors and Comorbidities Fitness;Comorbidity 3+    Comorbidities Endometriosis; Fibromyalgia; Ehlers-Danlos, IC    Examination-Activity Limitations Locomotion Level;Transfers;Lift;Stand;Squat;Sleep;Sit;Bed Mobility    Examination-Participation Restrictions Occupation;Community Activity;Driving;Interpersonal Relationship;Laundry;Shop;Cleaning;Meal Prep    Stability/Clinical Decision Making Evolving/Moderate complexity    Rehab Potential Good    PT Frequency 2x / week    PT Duration Other (comment)   4 months   PT Treatment/Interventions ADLs/Self Care Home Management;Biofeedback;Cryotherapy;Electrical Stimulation;Moist Heat;Ultrasound;Neuromuscular re-education;Therapeutic exercise;Therapeutic activities;Patient/family education;Manual techniques;Passive range of motion;Dry needling;Energy  conservation;Taping;Spinal Manipulations;Joint Manipulations    PT Next Visit Plan manual work to [employer] and outside of pelvic floor, place hot pack on back; engage the loewr abdominals    Consulted and Agree with Plan of Care Patient             Patient will benefit from skilled therapeutic intervention in order to improve the following deficits and impairments:  Decreased coordination, Increased fascial restricitons, Decreased endurance, Decreased activity tolerance, Increased muscle spasms, Pain, Decreased strength  Visit Diagnosis: Muscle weakness (generalized)  Other lack of coordination  Chronic bilateral low back pain without sciatica  Pelvic pain     Problem List Patient Active Problem List   Diagnosis Date Noted   BMI 32.0-32.9,adult 10/01/2014   Anxiety, Depression, Insomnia, Panic disorder 10/01/2014   Ehlers-Danlos disease 05/19/2014   Subclinical hyperthyroidism 02/03/2014   Acne 11/14/2013   Low serum progesterone 11/14/2013   Fibromyalgia 04/04/2012   TMJ (temporomandibular joint syndrome) 04/04/2012   Migraines 04/04/2012    Earlie Counts, PT 11/05/20 11:57 AM   [facility] [address], [place], [place], [address] Phone: [phone]   Fax:  [phone]  Name: DELAYZA LUNGREN MRN: [id number] Date of Birth: 10-14-1975  PHYSICAL THERAPY DISCHARGE SUMMARY  Visits from Start of Care: 6  Current functional level related to goals / functional outcomes: See above. Patient did not return to therapy after 11/05/2020.    Remaining deficits: See above. Not able to assess patient due to not  returning.    Education / Equipment: HEP   Patient agrees to discharge. Patient goals were not met. Patient is being discharged due to not returning since the last visit. Thank you for the referral. Earlie Counts, PT 09/06/21 11:37 AM

## 2021-04-03 ENCOUNTER — Emergency Department (HOSPITAL_BASED_OUTPATIENT_CLINIC_OR_DEPARTMENT_OTHER): Payer: BC Managed Care – PPO

## 2021-04-03 ENCOUNTER — Emergency Department (HOSPITAL_BASED_OUTPATIENT_CLINIC_OR_DEPARTMENT_OTHER)
Admission: EM | Admit: 2021-04-03 | Discharge: 2021-04-03 | Disposition: A | Discharge status: Home / Self Care | Payer: BC Managed Care – PPO | Attending: Emergency Medicine | Admitting: Emergency Medicine

## 2021-04-03 ENCOUNTER — Encounter (HOSPITAL_BASED_OUTPATIENT_CLINIC_OR_DEPARTMENT_OTHER): Payer: Self-pay | Admitting: Emergency Medicine

## 2021-04-03 ENCOUNTER — Other Ambulatory Visit: Payer: Self-pay

## 2021-04-03 DIAGNOSIS — R0789 Other chest pain: Secondary | ICD-10-CM | POA: Diagnosis not present

## 2021-04-03 DIAGNOSIS — G43811 Other migraine, intractable, with status migrainosus: Secondary | ICD-10-CM | POA: Diagnosis not present

## 2021-04-03 DIAGNOSIS — R630 Anorexia: Secondary | ICD-10-CM | POA: Insufficient documentation

## 2021-04-03 DIAGNOSIS — R519 Headache, unspecified: Secondary | ICD-10-CM | POA: Diagnosis present

## 2021-04-03 DIAGNOSIS — Z20822 Contact with and (suspected) exposure to covid-19: Secondary | ICD-10-CM | POA: Diagnosis not present

## 2021-04-03 DIAGNOSIS — R1010 Upper abdominal pain, unspecified: Secondary | ICD-10-CM | POA: Diagnosis not present

## 2021-04-03 DIAGNOSIS — R509 Fever, unspecified: Secondary | ICD-10-CM | POA: Insufficient documentation

## 2021-04-03 DIAGNOSIS — G43801 Other migraine, not intractable, with status migrainosus: Secondary | ICD-10-CM

## 2021-04-03 DIAGNOSIS — R079 Chest pain, unspecified: Secondary | ICD-10-CM

## 2021-04-03 DIAGNOSIS — R799 Abnormal finding of blood chemistry, unspecified: Secondary | ICD-10-CM | POA: Insufficient documentation

## 2021-04-03 LAB — COMPREHENSIVE METABOLIC PANEL
ALT: 14 U/L (ref 0–44)
AST: 14 U/L — ABNORMAL LOW (ref 15–41)
Albumin: 4.8 g/dL (ref 3.5–5.0)
Alkaline Phosphatase: 56 U/L (ref 38–126)
Anion gap: 13 (ref 5–15)
BUN: 9 mg/dL (ref 6–20)
CO2: 23 mmol/L (ref 22–32)
Calcium: 9.5 mg/dL (ref 8.9–10.3)
Chloride: 102 mmol/L (ref 98–111)
Creatinine, Ser: 0.77 mg/dL (ref 0.44–1.00)
GFR, Estimated: >60 mL/min (ref >60)
Glucose, Bld: 138 mg/dL — ABNORMAL HIGH (ref 70–99)
Potassium: 3.3 mmol/L — ABNORMAL LOW (ref 3.5–5.1)
Sodium: 138 mmol/L (ref 135–145)
Total Bilirubin: 0.6 mg/dL (ref 0.3–1.2)
Total Protein: 7.4 g/dL (ref 6.5–8.1)

## 2021-04-03 LAB — CBC
HCT: 41.9 % (ref 36.0–46.0)
Hemoglobin: 15.4 g/dL — ABNORMAL HIGH (ref 12.0–15.0)
MCH: 33.1 pg (ref 26.0–34.0)
MCHC: 36.8 g/dL — ABNORMAL HIGH (ref 30.0–36.0)
MCV: 90.1 fL (ref 80.0–100.0)
Platelets: 475 10*3/uL — ABNORMAL HIGH (ref 150–400)
RBC: 4.65 MIL/uL (ref 3.87–5.11)
RDW: 11.5 % (ref 11.5–15.5)
WBC: 10.4 10*3/uL (ref 4.0–10.5)
nRBC: 0 % (ref 0.0–0.2)

## 2021-04-03 LAB — TROPONIN I (HIGH SENSITIVITY): Troponin I (High Sensitivity): <2 ng/L (ref <18)

## 2021-04-03 LAB — URINALYSIS, ROUTINE W REFLEX MICROSCOPIC
Bilirubin Urine: NEGATIVE
Glucose, UA: NEGATIVE mg/dL
Ketones, ur: NEGATIVE mg/dL
Leukocytes,Ua: NEGATIVE
Nitrite: NEGATIVE
Protein, ur: NEGATIVE mg/dL
Specific Gravity, Urine: <1.005 — ABNORMAL LOW (ref 1.005–1.030)
pH: 8 (ref 5.0–8.0)

## 2021-04-03 LAB — RESP PANEL BY RT-PCR (FLU A&B, COVID) ARPGX2
Influenza A by PCR: NEGATIVE
Influenza B by PCR: NEGATIVE
SARS Coronavirus 2 by RT PCR: NEGATIVE

## 2021-04-03 LAB — PREGNANCY, URINE: Preg Test, Ur: NEGATIVE

## 2021-04-03 LAB — LIPASE, BLOOD: Lipase: 17 U/L (ref 11–51)

## 2021-04-03 LAB — D-DIMER, QUANTITATIVE: D-Dimer, Quant: <0.27 ug/mL-FEU (ref 0.00–0.50)

## 2021-04-03 MED ORDER — PROCHLORPERAZINE EDISYLATE 10 MG/2ML IJ SOLN
10.0000 mg | Freq: Once | INTRAMUSCULAR | Status: AC
  Administered 2021-04-03: 10 mg via INTRAVENOUS
  Filled 2021-04-03: qty 2

## 2021-04-03 MED ORDER — LACTATED RINGERS IV BOLUS
2000.0000 mL | Freq: Once | INTRAVENOUS | Status: AC
  Administered 2021-04-03: 2000 mL via INTRAVENOUS

## 2021-04-03 MED ORDER — DIPHENHYDRAMINE HCL 50 MG/ML IJ SOLN
12.5000 mg | Freq: Once | INTRAMUSCULAR | Status: AC
  Administered 2021-04-03: 12.5 mg via INTRAVENOUS
  Filled 2021-04-03: qty 1

## 2021-04-03 MED ORDER — KETOROLAC TROMETHAMINE 30 MG/ML IJ SOLN
15.0000 mg | Freq: Once | INTRAMUSCULAR | Status: AC
  Administered 2021-04-03: 15 mg via INTRAVENOUS
  Filled 2021-04-03: qty 1

## 2021-04-03 MED ORDER — ASPIRIN 81 MG PO CHEW
324.0000 mg | CHEWABLE_TABLET | Freq: Once | ORAL | Status: DC
  Filled 2021-04-03: qty 4

## 2021-04-03 NOTE — ED Notes (Signed)
Consulted with pharm r/t med reconcialtion

## 2021-04-03 NOTE — ED Notes (Signed)
ED Provider at bedside. 

## 2021-04-03 NOTE — ED Notes (Signed)
Pt is requesting to have all of her med reconciled before taking ordered medicine from ED with exception for LR

## 2021-04-03 NOTE — ED Notes (Signed)
Pt requesting pain and nausea medication

## 2021-04-03 NOTE — ED Triage Notes (Signed)
Pt from home complains of Chest pain that started yesterday evening. Pt stated that she received a flu shot yesterday afternoon. Pt states that  she has Ehlers danlos syndrome. Pt also complains of generalized weakness.

## 2021-04-03 NOTE — ED Provider Notes (Signed)
[facility] EM[facility]GENC[facility] DEPT Provider Note   CSN: [id number] Arrival date & time: 04/03/21  6283     History Chief Complaint  Patient presents with   Chest Pain    Casey Cox is a 45 y.o. female.   Chest Pain Associated symptoms: abdominal pain, fever, headache and weakness    Patient states she started having symptoms of low-grade fevers earlier in the week.  She ended up getting her flu shot the day after.  She started having persistent symptoms of chills.  Last evening patient started having pressure discomfort in her chest that has been constant.  She feels like her heart is racing.  She cannot get it to slow down.  She is also having some nausea and pain in her upper abdomen.  Patient is having difficulty eating or drinking.  She is feeling chilled and lightheaded.  Patient also started develop a migraine headache.  She has photophobia.  She also has some stuttering speech which she gets when she gets a migraine  Patient does have history of multiple medical problems including fibromyalgia, Ehlers-Danlos syndrome and gastroparesis.  She was started on a new medication Reglan but stopped taking it because of concerns about the side effects.  She continues to pain in her upper abdomen and decreased p.o. intake.  She does follow with gastroenterology at [facility]  Past Medical History:  Diagnosis Date   Acne    sees dermatologist   Anxiety and depression    Ehlers-Danlos syndrome    per pt dx with her rheuamtologist  (joint hypermobility, connective tissue disorder)   Endometriosis    Fibromyalgia    History of concussion    2009-- no residual   History of melasma    skin depigmentation   Interstitial cystitis    Migraine    Osteoarthritis    jaw, left ankle, knees   Panic disorder    hx pain attacks   PCOS (polycystic ovarian syndrome)    Pelvic pain in female    Seasonal allergies    Subclinical hyperthyroidism    sees endocrinologist--- DR  Cruzita Lederer   TMJ syndrome     Patient Active Problem List   Diagnosis Date Noted   BMI 32.0-32.9,adult 10/01/2014   Anxiety, Depression, Insomnia, Panic disorder 10/01/2014   Ehlers-Danlos disease 05/19/2014   Subclinical hyperthyroidism 02/03/2014   Acne 11/14/2013   Low serum progesterone 11/14/2013   Fibromyalgia 04/04/2012   TMJ (temporomandibular joint syndrome) 04/04/2012   Migraines 04/04/2012    Past Surgical History:  Procedure Laterality Date   ANKLE RECONSTRUCTION Left 04/08/2014   Procedure: RECONSTRUCTION LEFT ANKLE, REPAIR SECONDARY DISRUPTED LIGAMENT ANKLE COLLATERAL, TRANSPLANTERIOR/TRANSFER TENDON TIBIAL EXTENSOR INTO MIDFOOT DEEP;  Surgeon: Yvette Rack., MD;  Location: [facility];  Service: Orthopedics;  Laterality: Left;   ARTHROTOMY Bilateral 12/23/2013   Procedure: BILATERAL ARTHROTOMY, MENISECTOMY, TEMPORALIS FASCIA GRAFT;  Surgeon: Gae Bon, DDS;  Location: [facility];  Service: Oral Surgery;  Laterality: Bilateral;   TMJ   LAPAROSCOPY N/A 06/24/2013   Procedure: LAPAROSCOPY OPERATIVE WITH CHROMOPERTUBATION, LYSIS OF ADHESIONS AND APPENDECTOMY ;  Surgeon: Linda Hedges, DO;  Location: [facility];  Service: Gynecology;  Laterality: N/A;   LAPAROSCOPY N/A 08/07/2016   Procedure: LAPAROSCOPY OPERATIVE, FULGERATION  OF ENDOMETRIOSIS ;  Surgeon: Linda Hedges, DO;  Location: [facility];  Service: Gynecology;  Laterality: N/A;   LIGAMENT REPAIR Right 01/13/2014   Procedure: RIGHT ANKLE REPAIR SECONDAY DISRUPTED LIGAMENT ANKLE  COLLATERAL WITH PERONEAL  TENDON AUTOGRAFT;  Surgeon: Yvette Rack., MD;  Location: [facility];  Service: Orthopedics;  Laterality: Right;   TMJ ARTHROCENTESIS Bilateral 07-04-2012   w/  IV Sedation in office   WISDOM TOOTH EXTRACTION  2004     OB History     Gravida  2   Para      Term      Preterm      AB  2   Living  0      SAB  2   IAB      Ectopic      Multiple      Live Births               Family History  Problem Relation Age of Onset   Fibromyalgia Mother    Arthritis Mother    Cancer Mother        SKIN CA   Diabetes Father    Arthritis Father    Mental illness Maternal Grandmother    Cancer Maternal Grandfather        PROSTATE CA   Diabetes Maternal Grandfather    Diabetes Paternal Grandmother    Hyperlipidemia Paternal Grandmother    Hypertension Paternal Grandmother    Hypertension Paternal Grandfather    Arthritis Paternal Grandfather    Heart disease Paternal Grandfather     Social History   Tobacco Use   Smoking status: Never   Smokeless tobacco: Never  Substance Use Topics   Alcohol use: Not Currently   Drug use: No    Home Medications Prior to Admission medications   Medication Sig Start Date End Date Taking? Authorizing Provider  ALPRAZolam Duanne Moron) 0.5 MG tablet Take 0.5 mg by mouth 2 (two) times daily as needed for sleep.    Yes [provider]  buPROPion (WELLBUTRIN XL) 300 MG 24 hr tablet Take 300 mg by mouth daily.   Yes [provider]  buPROPion (ZYBAN) 150 MG 12 hr tablet Take 150 mg by mouth 2 (two) times daily.   Yes [provider]  Calcium Polycarbophil (FIBER-CAPS PO) Take by mouth daily.   Yes [provider]  cetirizine (ZYRTEC) 10 MG chewable tablet Chew 10 mg by mouth daily.   Yes [provider]  Cholecalciferol (VITAMIN D3 PO) Take 4,000 Int'l Units by mouth every morning.   Yes [provider]  dicyclomine (BENTYL) 10 MG capsule Take 10 mg by mouth 4 (four) times daily -  before meals and at bedtime.   Yes [provider]  levonorgestrel (MIRENA) 20 MCG/24HR IUD 1 each by Intrauterine route once. PLACED 09/ 2015   Yes [provider]  Multiple Vitamin (MULTIVITAMIN) tablet Take 1 tablet by mouth daily.   Yes [provider]  ondansetron (ZOFRAN) 4 MG tablet Take 4 mg by mouth every 8 (eight) hours as needed for nausea or vomiting.    Yes [provider]  Probiotic Product (PROBIOTIC DAILY PO) Take 1 capsule by mouth every morning.   Yes [provider]  propranolol (INDERAL) 20 MG tablet Take 20 mg by mouth 3 (three) times daily.   Yes [provider]  rizatriptan (MAXALT) 10 MG tablet Take 10 mg by mouth as needed for migraine. May repeat in 2 hours if needed   Yes [provider]  spironolactone (ALDACTONE) 100 MG tablet Take 100 mg by mouth every evening.  09/30/14  Yes [provider]  DULoxetine (CYMBALTA) 60 MG capsule Take 60 mg  by mouth every morning. Patient not taking: Reported on 09/02/2020    [provider]  fluticasone (FLONASE) 50 MCG/ACT nasal spray Place 1 spray into both nostrils daily as needed for allergies or rhinitis. Patient not taking: Reported on 09/02/2020    [provider]  ibuprofen (ADVIL,MOTRIN) 800 MG tablet Take 1 tablet (800 mg total) by mouth every 6 (six) hours as needed. Patient not taking: Reported on 09/02/2020 08/07/16   Linda Hedges, DO  ondansetron (ZOFRAN) 4 MG tablet Take 2 tablets (8 mg total) by mouth every 8 (eight) hours as needed for nausea or vomiting. 08/07/16   Linda Hedges, DO  oxyCODONE-acetaminophen (ROXICET) 5-325 MG tablet Take 1-2 tablets by mouth every 6 (six) hours as needed for severe pain. Patient not taking: Reported on 09/02/2020 08/07/16   Linda Hedges, DO  Specialty Vitamins Products (BIOTIN PLUS KERATIN PO) Take 1 tablet by mouth daily. Patient not taking: Reported on 09/02/2020    [provider]    Allergies    Amoxicillin and Nickel  Review of Systems   Review of Systems  Constitutional:  Positive for fever.  Cardiovascular:  Positive for chest pain.  Gastrointestinal:  Positive for abdominal pain.  Neurological:  Positive for weakness and headaches.  All other systems reviewed and are negative.  Physical Exam Updated Vital Signs BP 109/79   Pulse 73   Temp 98.7 F (37.1 C) (Oral)    Resp 18   Ht 1.575 m (5\' 2" )   Wt 54.4 kg   SpO2 100%   BMI 21.95 kg/m   Physical Exam Vitals and nursing note reviewed.  Constitutional:      Appearance: She is well-developed. She is ill-appearing.  HENT:     Head: Normocephalic and atraumatic.     Right Ear: External ear normal.     Left Ear: External ear normal.  Eyes:     General: No scleral icterus.       Right eye: No discharge.        Left eye: No discharge.     Conjunctiva/sclera: Conjunctivae normal.  Neck:     Trachea: No tracheal deviation.  Cardiovascular:     Rate and Rhythm: Normal rate and regular rhythm.  Pulmonary:     Effort: Pulmonary effort is normal. No respiratory distress.     Breath sounds: Normal breath sounds. No stridor. No wheezing or rales.  Abdominal:     General: Bowel sounds are normal. There is no distension.     Palpations: Abdomen is soft.     Tenderness: There is abdominal tenderness in the epigastric area. There is no guarding or rebound.  Musculoskeletal:        General: No tenderness or deformity.     Cervical back: Neck supple.  Skin:    General: Skin is warm and dry.     Findings: No rash.  Neurological:     General: No focal deficit present.     Cranial Nerves: No cranial nerve deficit (no facial droop, extraocular movements intact, no slurred speech).     Sensory: No sensory deficit.     Motor: No abnormal muscle tone or seizure activity.     Coordination: Coordination normal.  Psychiatric:        Mood and Affect: Mood normal.    ED Results / Procedures / Treatments   Labs (all labs ordered are listed, but only abnormal results are displayed) Labs Reviewed  CBC - Abnormal; Notable for the following components:  Result Value   Hemoglobin 15.4 (*)    MCHC 36.8 (*)    Platelets 475 (*)    All other components within normal limits  COMPREHENSIVE METABOLIC PANEL - Abnormal; Notable for the following components:   Potassium 3.3 (*)    Glucose, Bld 138 (*)    AST 14  (*)    All other components within normal limits  URINALYSIS, ROUTINE W REFLEX MICROSCOPIC - Abnormal; Notable for the following components:   Color, Urine COLORLESS (*)    Specific Gravity, Urine <1.005 (*)    Hgb urine dipstick SMALL (*)    Bacteria, UA RARE (*)    All other components within normal limits  RESP PANEL BY RT-PCR (FLU A&B, COVID) ARPGX2  PREGNANCY, URINE  LIPASE, BLOOD  D-DIMER, QUANTITATIVE (NOT AT [facility])  CBG MONITORING, ED  TROPONIN I (HIGH SENSITIVITY)    EKG EKG Interpretation  Date/Time:  Sunday April 03 2021 08:40:24 EST Ventricular Rate:  106 PR Interval:  144 QRS Duration: 88 QT Interval:  333 QTC Calculation: 443 R Axis:   88 Text Interpretation: Sinus tachycardia Borderline repolarization abnormality No old tracing to compare Confirmed by Dorie Rank [phone]) on 04/03/2021 8:47:41 AM  Radiology DG Chest Portable 1 View  Result Date: 04/03/2021 CLINICAL DATA:  Chest pain beginning yesterday. Weakness. Ehlers-Danlos syndrome. EXAM: PORTABLE CHEST 1 VIEW COMPARISON:  None. FINDINGS: The heart size and mediastinal contours are within normal limits. Both lungs are clear. The visualized skeletal structures are unremarkable. IMPRESSION: No active disease. Electronically Signed   By: Marlaine Hind M.D.   On: 04/03/2021 09:28    Procedures Procedures   Medications Ordered in ED Medications  prochlorperazine (COMPAZINE) injection 10 mg (10 mg Intravenous Given 04/03/21 1056)  diphenhydrAMINE (BENADRYL) injection 12.5 mg (12.5 mg Intravenous Given 04/03/21 1053)  lactated ringers bolus 2,000 mL (0 mLs Intravenous Stopped 04/03/21 1035)  ketorolac (TORADOL) 30 MG/ML injection 15 mg (15 mg Intravenous Given 04/03/21 1102)    ED Course  I have reviewed the triage vital signs and the nursing notes.  Pertinent labs & imaging results that were available during my care of the patient were reviewed by me and considered in my medical decision making (see chart for  details).  Clinical Course as of 04/04/21 1344  Sun Apr 03, 2021  9798 D-dimer is negative.  CBC is normal [JK]  0951 Chest x-ray without acute findings [JK]  1024 Troponin is normal. [JK]  1030 Laboratory tests reviewed with patient and family.  All reassuring at this point [JK]  1030 Patient now primarily complaining of headache.  Initially did not want the Compazine.  She does agree to try that now at this point [JK]  1246 Urinalysis without signs of infection [JK]  1308 Patient is feeling much better now.  Ready for discharge [JK]    Clinical Course User Index [JK] Dorie Rank, MD   MDM Rules/Calculators/A&P                           Patient presented to the ED for evaluation of chest pain headache nausea and vomiting.  No signs of pna.  Doubt pe, d dimer negative.  Low risk heart score, doubt cardiac disease, etiology.  Pt with history of migraine headache.  Treated with IV fluids and antiemetics.  Sx improved.  Headache resolved, no further vomiting.  Stable for discharge. Final Clinical Impression(s) / ED Diagnoses Final diagnoses:  Other migraine with status  migrainosus, not intractable  Chest pain, unspecified type    Rx / DC Orders ED Discharge Orders     None        Dorie Rank, MD 04/04/21 1344

## 2021-04-03 NOTE — Discharge Instructions (Signed)
Continue your current medications.  Follow-up with your doctor to be rechecked if the symptoms return.

## 2021-06-29 DIAGNOSIS — Z6823 Body mass index (BMI) 23.0-23.9, adult: Secondary | ICD-10-CM | POA: Diagnosis not present

## 2021-06-29 DIAGNOSIS — D125 Benign neoplasm of sigmoid colon: Secondary | ICD-10-CM | POA: Diagnosis not present

## 2021-06-29 DIAGNOSIS — R112 Nausea with vomiting, unspecified: Secondary | ICD-10-CM | POA: Diagnosis not present

## 2021-06-29 DIAGNOSIS — K5900 Constipation, unspecified: Secondary | ICD-10-CM | POA: Diagnosis not present

## 2021-06-29 DIAGNOSIS — R634 Abnormal weight loss: Secondary | ICD-10-CM | POA: Diagnosis not present

## 2021-06-29 DIAGNOSIS — K635 Polyp of colon: Secondary | ICD-10-CM | POA: Diagnosis not present

## 2021-06-29 DIAGNOSIS — K5140 Inflammatory polyps of colon without complications: Secondary | ICD-10-CM | POA: Diagnosis not present

## 2021-07-07 DIAGNOSIS — G8929 Other chronic pain: Secondary | ICD-10-CM | POA: Diagnosis not present

## 2021-07-07 DIAGNOSIS — R102 Pelvic and perineal pain: Secondary | ICD-10-CM | POA: Diagnosis not present

## 2021-07-07 DIAGNOSIS — M7918 Myalgia, other site: Secondary | ICD-10-CM | POA: Diagnosis not present

## 2021-07-07 DIAGNOSIS — N809 Endometriosis, unspecified: Secondary | ICD-10-CM | POA: Diagnosis not present

## 2021-08-18 DIAGNOSIS — M549 Dorsalgia, unspecified: Secondary | ICD-10-CM | POA: Diagnosis not present

## 2021-08-18 DIAGNOSIS — Q796 Ehlers-Danlos syndrome, unspecified: Secondary | ICD-10-CM | POA: Diagnosis not present

## 2021-08-18 DIAGNOSIS — M797 Fibromyalgia: Secondary | ICD-10-CM | POA: Diagnosis not present

## 2021-08-18 DIAGNOSIS — G8929 Other chronic pain: Secondary | ICD-10-CM | POA: Diagnosis not present

## 2021-11-15 DIAGNOSIS — N959 Unspecified menopausal and perimenopausal disorder: Secondary | ICD-10-CM | POA: Diagnosis not present

## 2022-06-01 IMAGING — US US BREAST*R* LIMITED INC AXILLA
1 series · 11 of 11 positions shown · non-contrast
Comparison: 08/07/2016 and earlier

CLINICAL DATA: Tender mass in the 2 o'clock location of the LEFT
breast.

EXAM:
DIGITAL DIAGNOSTIC BILATERAL MAMMOGRAM WITH TOMOSYNTHESIS AND CAD;
ULTRASOUND LEFT BREAST LIMITED; ULTRASOUND RIGHT BREAST LIMITED
TECHNIQUE: Bilateral digital diagnostic mammography and breast tomosynthesis
was performed. The images were evaluated with computer-aided
detection.; Targeted ultrasound examination of the left breast was
performed; Targeted ultrasound examination of the right breast was
performed

[Series 1: us breast*right* limited inc axilla · 0.06mm/px · 11 of 11 slices shown]
[im 1/11]
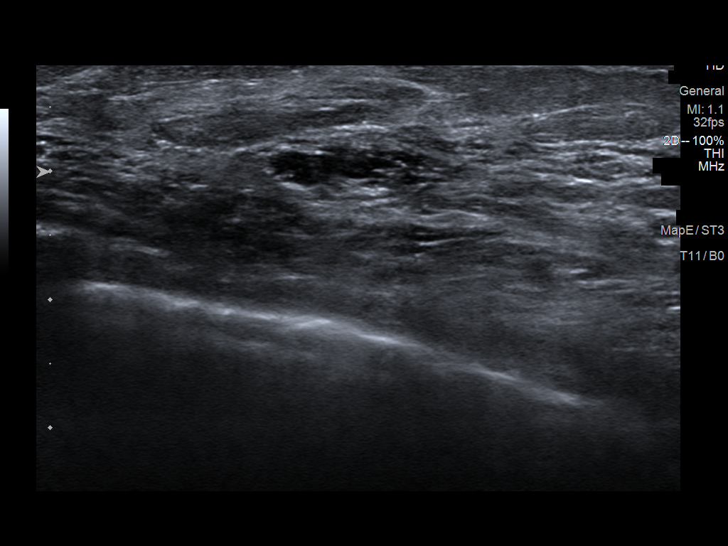
[im 2/11]
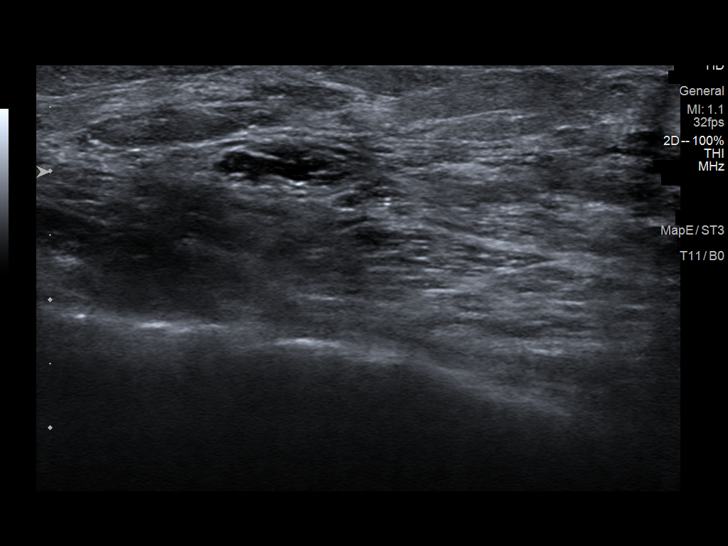
[im 3/11]
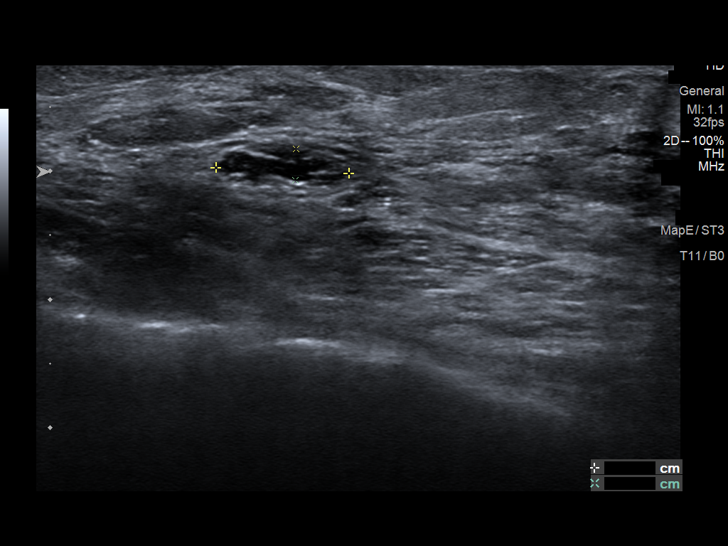
[im 4/11]
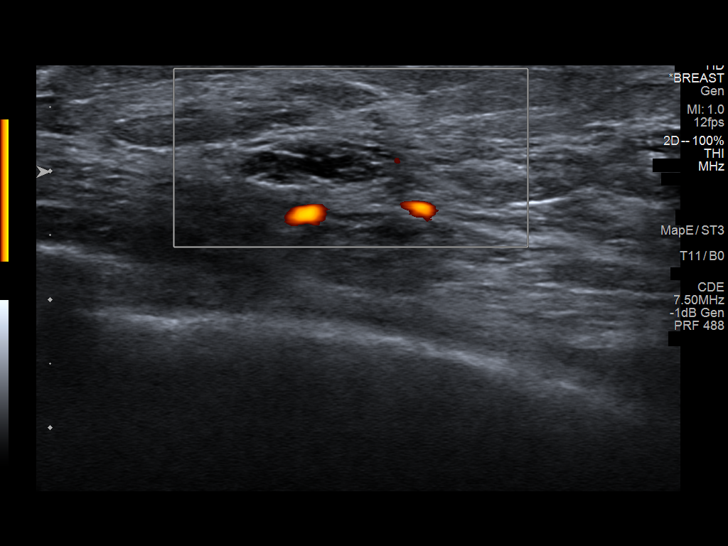
[im 5/11]
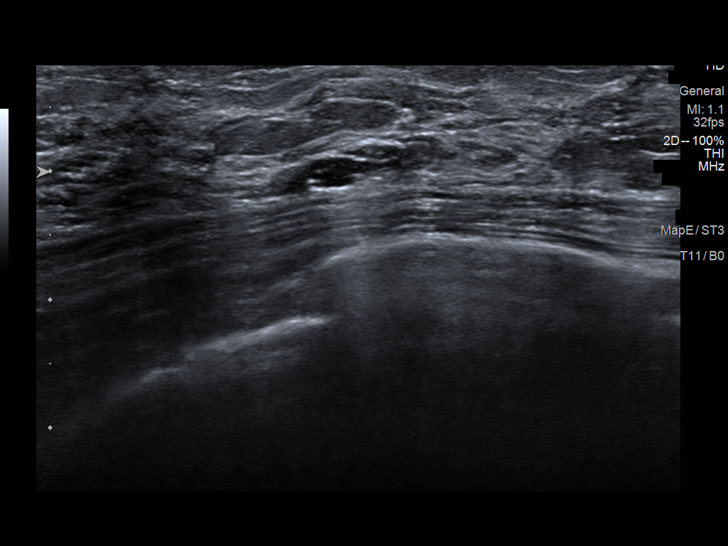
[im 6/11]
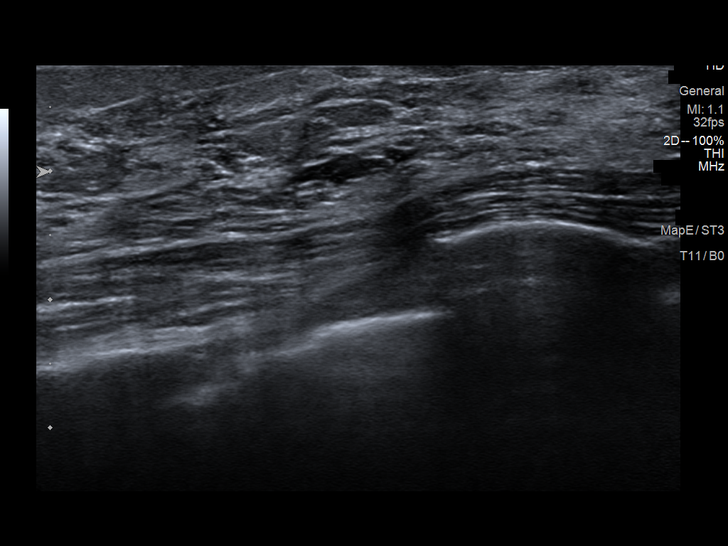
[im 7/11]
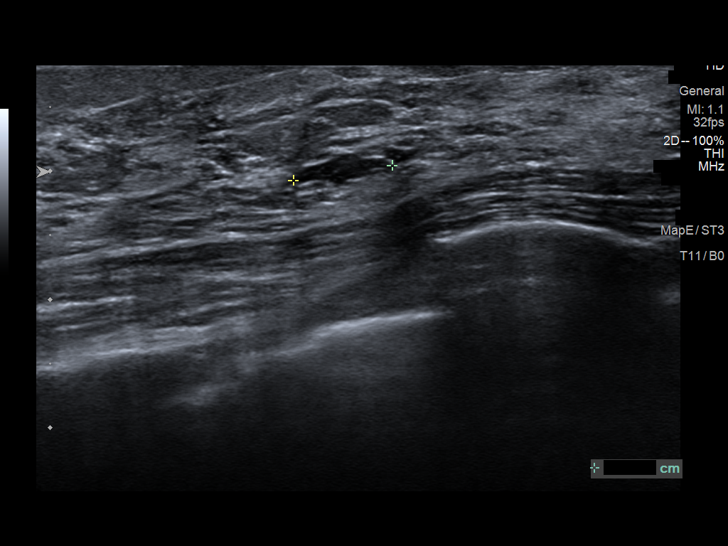
[im 8/11]
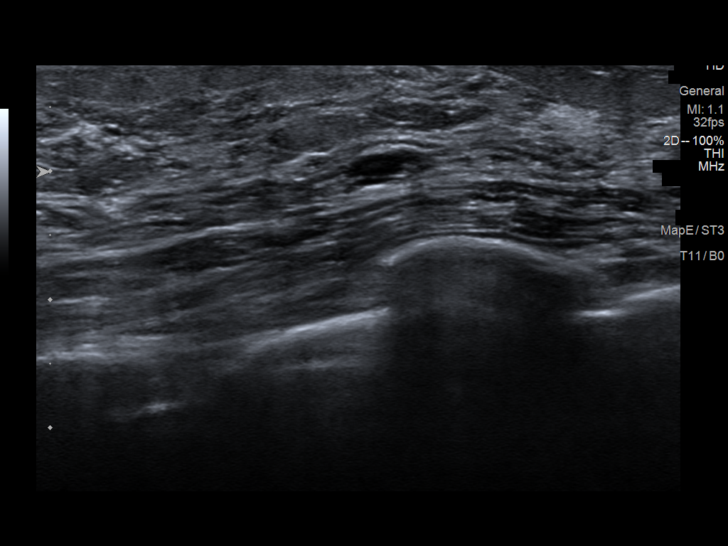
[im 9/11]
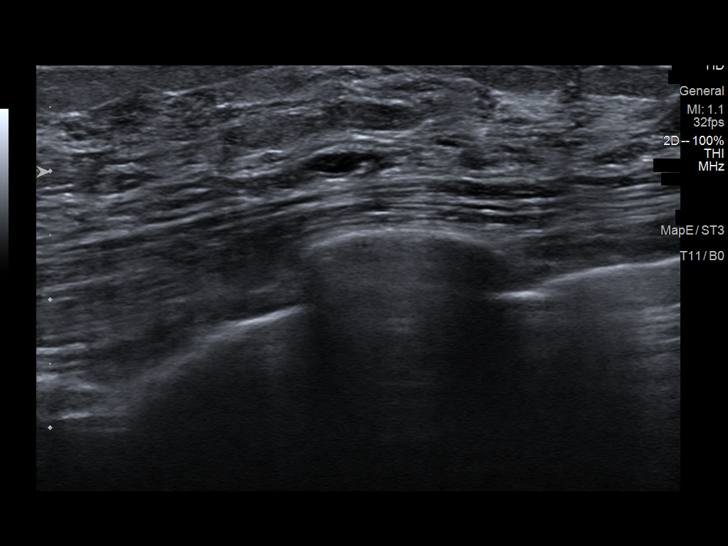
[im 10/11]
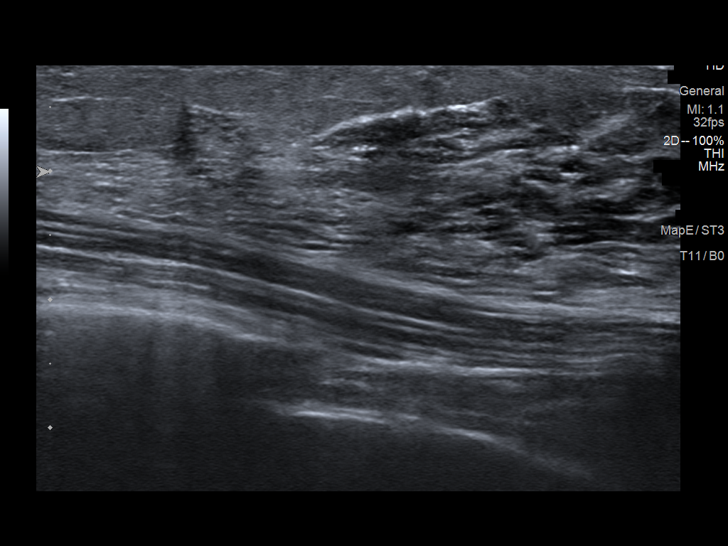
[im 11/11]
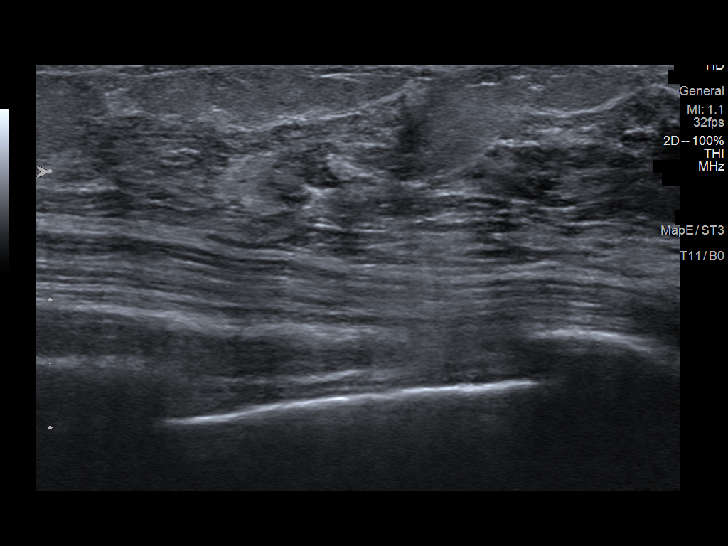

[11 of 11 positions shown; findings below may reference images not displayed]

ACR Breast Density Category c: The breast tissue is heterogeneously
dense, which may obscure small masses.
FINDINGS: RIGHT BREAST:

Mammogram: Asymmetry in the anterior UPPER OUTER QUADRANT of the
RIGHT breast is confirmed on spot compression views. These views
demonstrate no mass or distortion. Mammographic images were
processed with CAD.

Ultrasound: Targeted ultrasound is performed, showing dense
fibroglandular tissue with scattered fibrocystic changes in the
anterior UPPER OUTER QUADRANT of the RIGHT breast.

LEFT BREAST:

Mammogram: No suspicious mass, distortion, or microcalcifications
are identified to suggest presence of malignancy. Spot tangential
view is performed in the area of patient's concern, showing normal
appearing fibroglandular tissue without mass. Mammographic images
were processed with CAD.

Physical Exam: I palpate soft nodularity in the UPPER-OUTER QUADRANT
of the LEFT breast. I palpate no discreet or firm mass.

Ultrasound: Targeted ultrasound is performed, showing normal
appearing dense fibroglandular tissue with scattered fibrocystic
changes. No suspicious mass, distortion, or acoustic shadowing is
demonstrated with ultrasound.
IMPRESSION: No mammographic or ultrasound evidence for malignancy.

RECOMMENDATION:
Screening mammogram in one year.(Code:D7-C-IAO)

I have discussed the findings and recommendations with the patient.
If applicable, a reminder letter will be sent to the patient
regarding the next appointment.

BI-RADS CATEGORY  1: Negative.

## 2022-06-01 IMAGING — US US BREAST*L* LIMITED INC AXILLA
1 series · 7 of 7 positions shown · non-contrast
Comparison: 08/07/2016 and earlier

CLINICAL DATA: Tender mass in the 2 o'clock location of the LEFT
breast.

EXAM:
DIGITAL DIAGNOSTIC BILATERAL MAMMOGRAM WITH TOMOSYNTHESIS AND CAD;
ULTRASOUND LEFT BREAST LIMITED; ULTRASOUND RIGHT BREAST LIMITED
TECHNIQUE: Bilateral digital diagnostic mammography and breast tomosynthesis
was performed. The images were evaluated with computer-aided
detection.; Targeted ultrasound examination of the left breast was
performed; Targeted ultrasound examination of the right breast was
performed

[Series 1: us breast*left* limited inc axilla · 0.06mm/px · 7 of 7 slices shown]
[im 1/7]
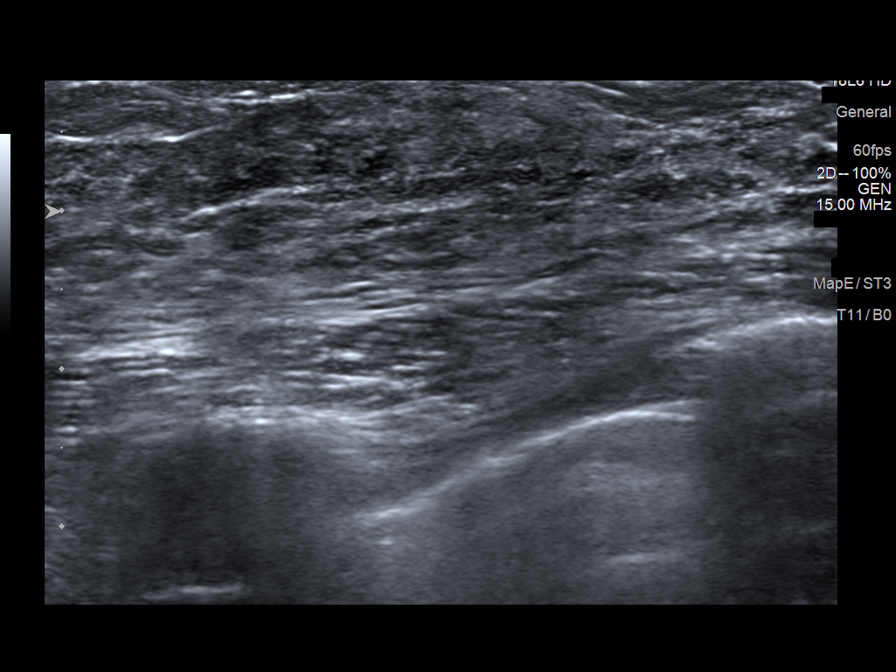
[im 2/7]
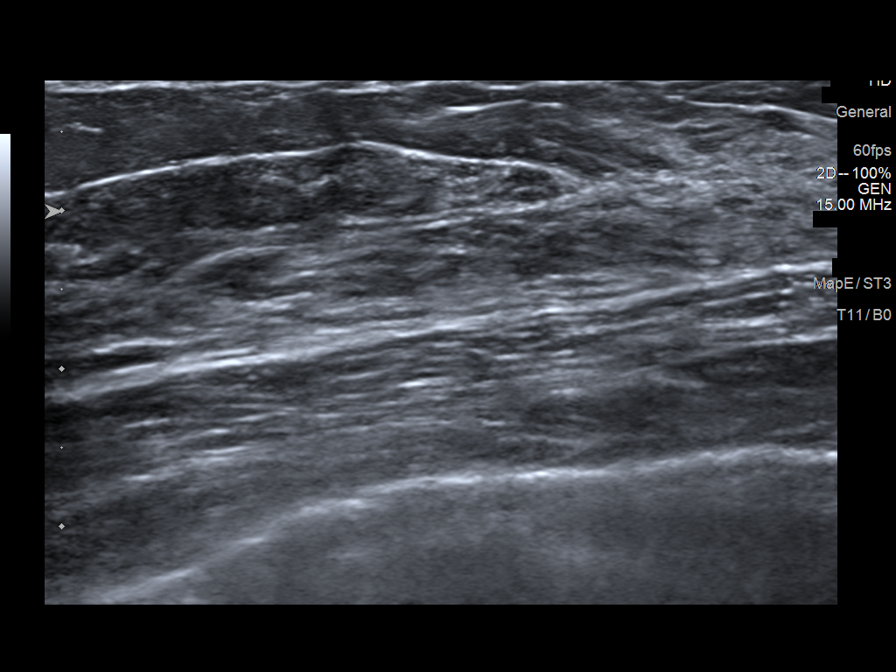
[im 3/7]
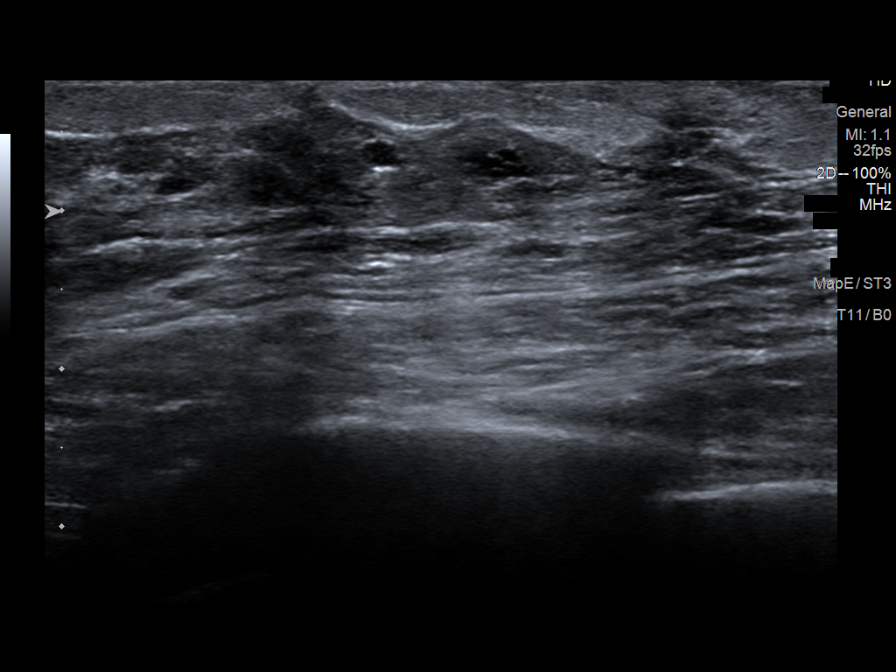
[im 4/7]
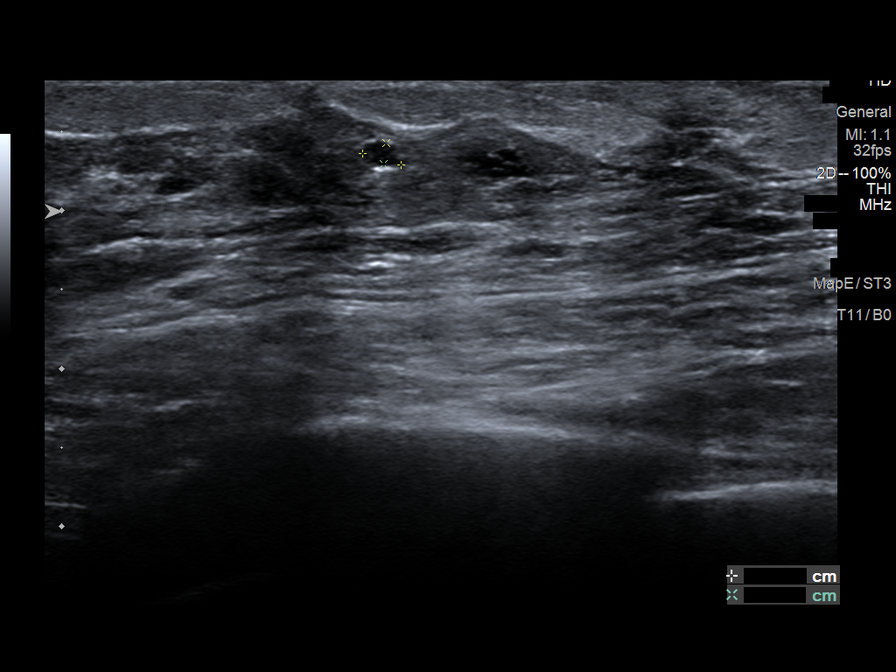
[im 5/7]
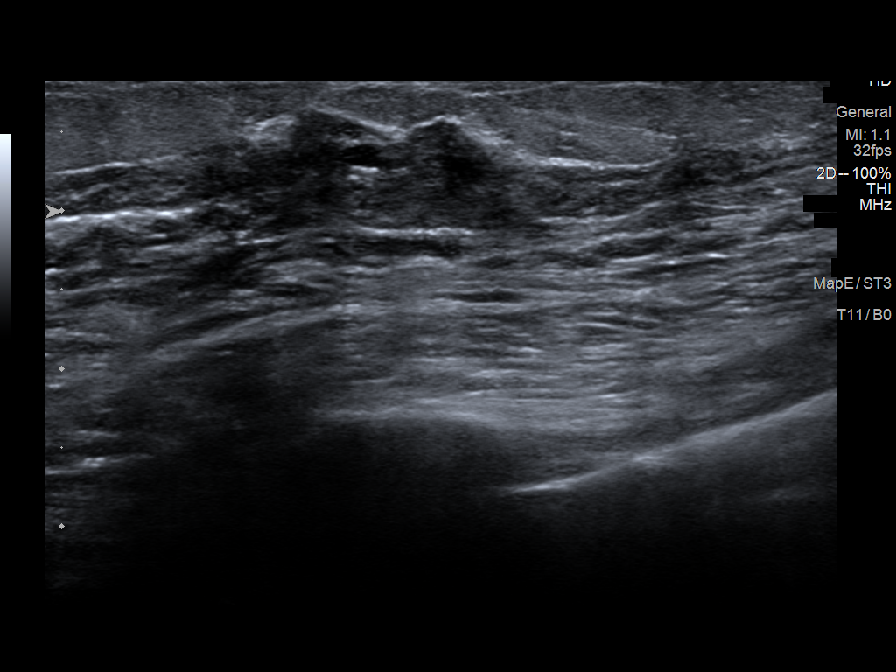
[im 6/7]
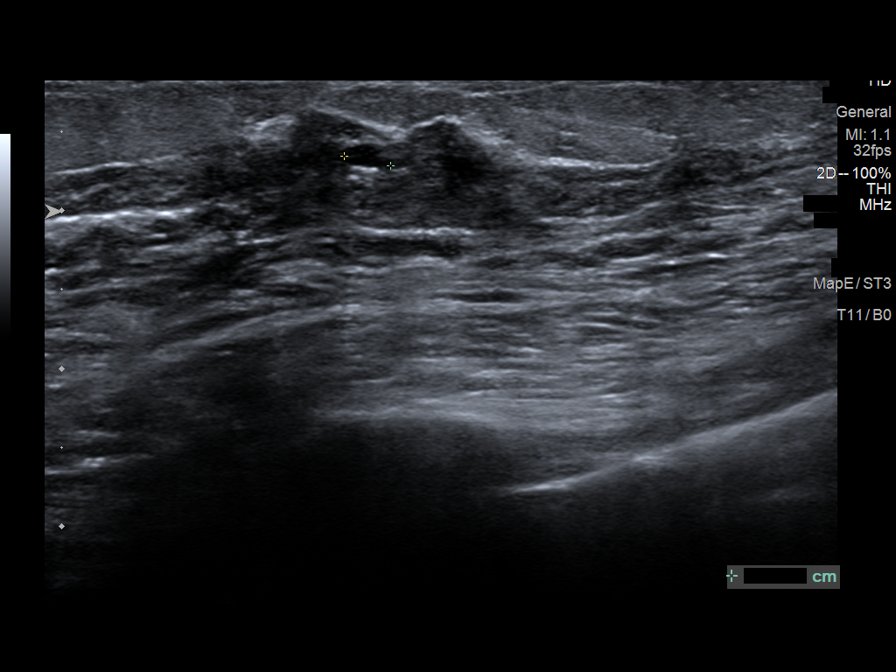
[im 7/7]
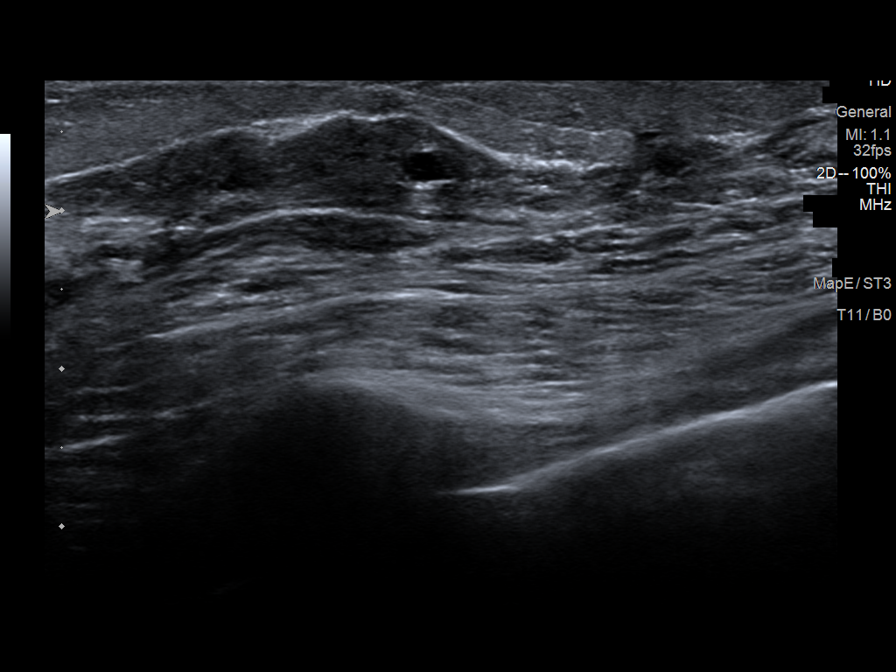

[7 of 7 positions shown; findings below may reference images not displayed]

ACR Breast Density Category c: The breast tissue is heterogeneously
dense, which may obscure small masses.
FINDINGS: RIGHT BREAST:

Mammogram: Asymmetry in the anterior UPPER OUTER QUADRANT of the
RIGHT breast is confirmed on spot compression views. These views
demonstrate no mass or distortion. Mammographic images were
processed with CAD.

Ultrasound: Targeted ultrasound is performed, showing dense
fibroglandular tissue with scattered fibrocystic changes in the
anterior UPPER OUTER QUADRANT of the RIGHT breast.

LEFT BREAST:

Mammogram: No suspicious mass, distortion, or microcalcifications
are identified to suggest presence of malignancy. Spot tangential
view is performed in the area of patient's concern, showing normal
appearing fibroglandular tissue without mass. Mammographic images
were processed with CAD.

Physical Exam: I palpate soft nodularity in the UPPER-OUTER QUADRANT
of the LEFT breast. I palpate no discreet or firm mass.

Ultrasound: Targeted ultrasound is performed, showing normal
appearing dense fibroglandular tissue with scattered fibrocystic
changes. No suspicious mass, distortion, or acoustic shadowing is
demonstrated with ultrasound.
IMPRESSION: No mammographic or ultrasound evidence for malignancy.

RECOMMENDATION:
Screening mammogram in one year.(Code:D7-C-IAO)

I have discussed the findings and recommendations with the patient.
If applicable, a reminder letter will be sent to the patient
regarding the next appointment.

BI-RADS CATEGORY  1: Negative.

## 2022-12-19 ENCOUNTER — Ambulatory Visit: Payer: BC Managed Care – PPO | Admitting: Physician Assistant

## 2023-02-13 IMAGING — DX DG CHEST 1V PORT
1 series · 1 of 1 positions shown · non-contrast
Comparison: None.

CLINICAL DATA: Chest pain beginning yesterday. Weakness.
Ehlers-Danlos syndrome.

EXAM:
PORTABLE CHEST 1 VIEW

[chest]
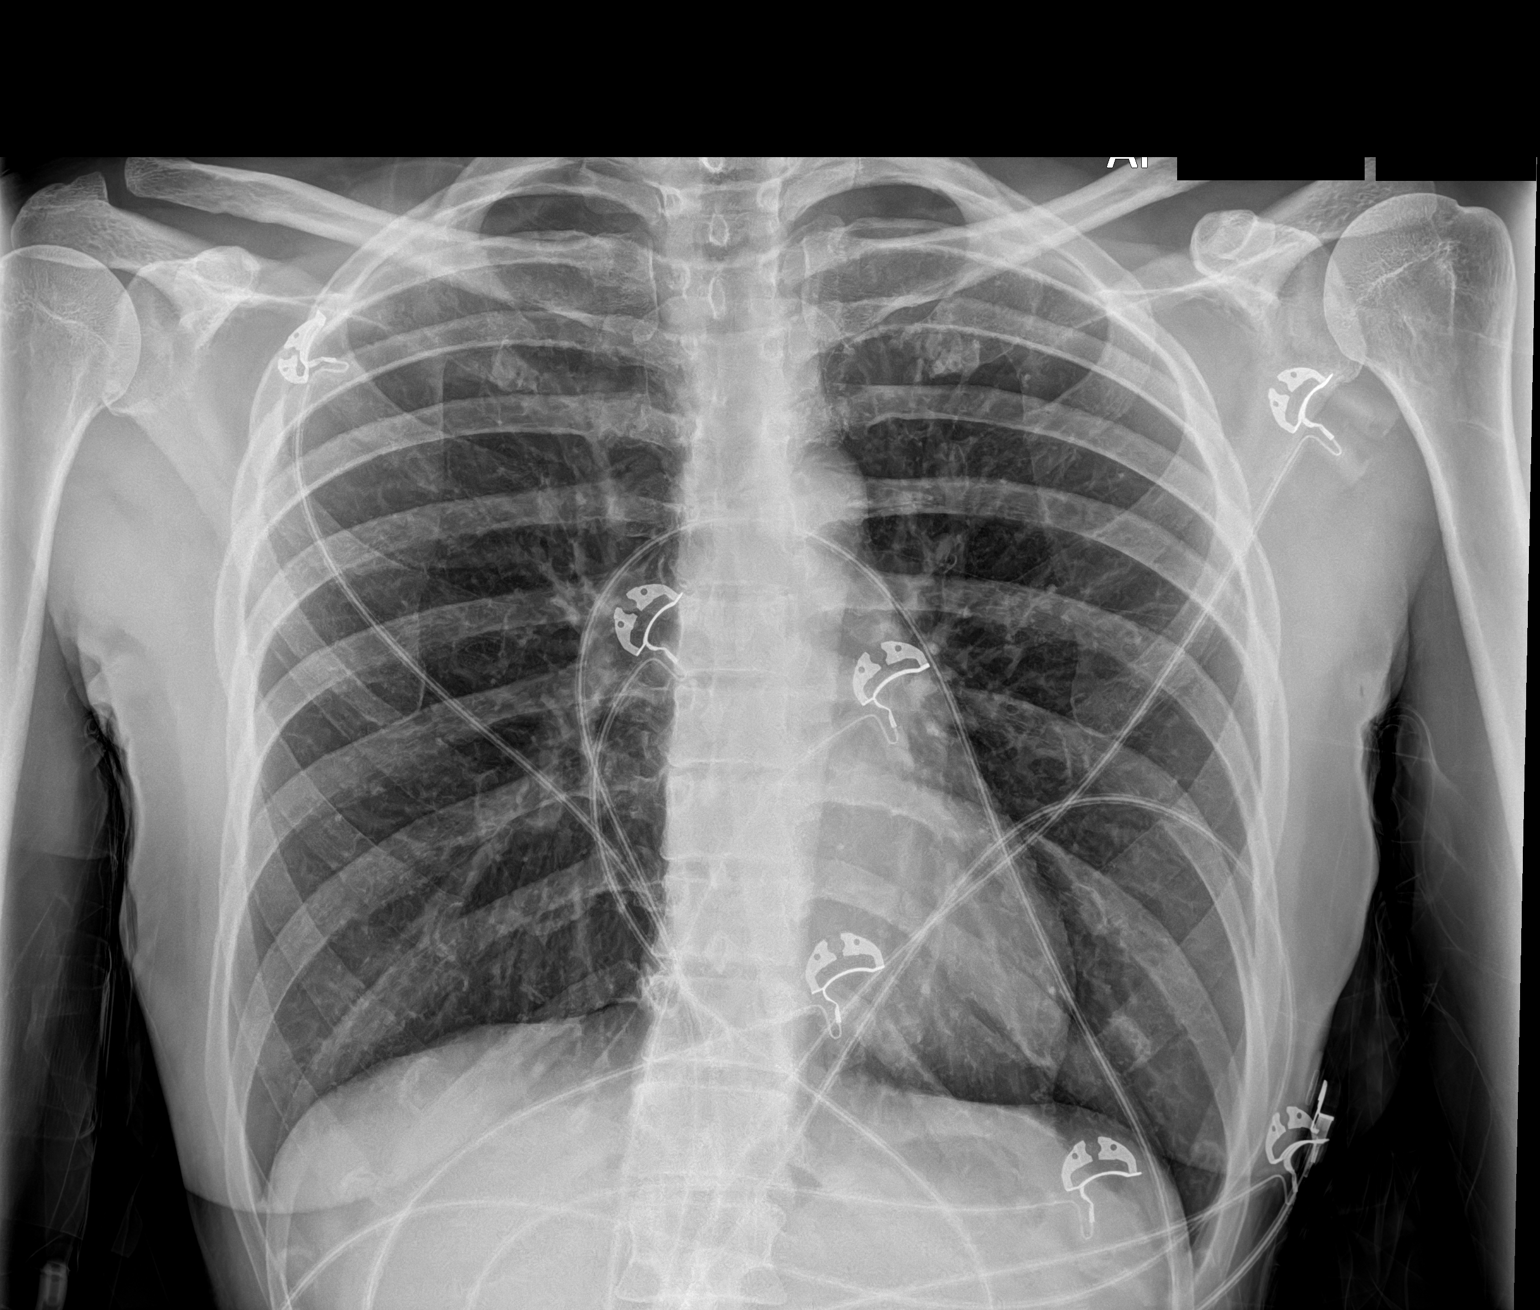

[1 of 1 positions shown; findings below may reference images not displayed]

FINDINGS: The heart size and mediastinal contours are within normal limits.
Both lungs are clear. The visualized skeletal structures are
unremarkable.
IMPRESSION: No active disease.

## 2023-03-06 ENCOUNTER — Encounter: Payer: Self-pay | Admitting: Obstetrics & Gynecology

## 2023-03-06 DIAGNOSIS — Z1231 Encounter for screening mammogram for malignant neoplasm of breast: Secondary | ICD-10-CM

## 2023-03-15 DIAGNOSIS — Z1329 Encounter for screening for other suspected endocrine disorder: Secondary | ICD-10-CM | POA: Diagnosis not present

## 2023-03-15 DIAGNOSIS — Z6825 Body mass index (BMI) 25.0-25.9, adult: Secondary | ICD-10-CM | POA: Diagnosis not present

## 2023-03-15 DIAGNOSIS — R61 Generalized hyperhidrosis: Secondary | ICD-10-CM | POA: Diagnosis not present

## 2023-03-15 DIAGNOSIS — N644 Mastodynia: Secondary | ICD-10-CM | POA: Diagnosis not present

## 2023-03-15 DIAGNOSIS — R110 Nausea: Secondary | ICD-10-CM | POA: Diagnosis not present

## 2023-03-15 DIAGNOSIS — Z01419 Encounter for gynecological examination (general) (routine) without abnormal findings: Secondary | ICD-10-CM | POA: Diagnosis not present

## 2023-03-15 DIAGNOSIS — Z1321 Encounter for screening for nutritional disorder: Secondary | ICD-10-CM | POA: Diagnosis not present

## 2023-03-15 DIAGNOSIS — R6882 Decreased libido: Secondary | ICD-10-CM | POA: Diagnosis not present

## 2023-03-15 DIAGNOSIS — N898 Other specified noninflammatory disorders of vagina: Secondary | ICD-10-CM | POA: Diagnosis not present

## 2023-03-15 DIAGNOSIS — Z131 Encounter for screening for diabetes mellitus: Secondary | ICD-10-CM | POA: Diagnosis not present

## 2023-03-15 DIAGNOSIS — N959 Unspecified menopausal and perimenopausal disorder: Secondary | ICD-10-CM | POA: Diagnosis not present

## 2023-03-15 DIAGNOSIS — Z13220 Encounter for screening for lipoid disorders: Secondary | ICD-10-CM | POA: Diagnosis not present

## 2023-03-16 ENCOUNTER — Other Ambulatory Visit: Payer: Self-pay | Admitting: Family

## 2023-03-16 DIAGNOSIS — N644 Mastodynia: Secondary | ICD-10-CM

## 2023-03-20 DIAGNOSIS — F411 Generalized anxiety disorder: Secondary | ICD-10-CM | POA: Diagnosis not present

## 2023-03-20 DIAGNOSIS — L700 Acne vulgaris: Secondary | ICD-10-CM | POA: Diagnosis not present

## 2023-03-20 DIAGNOSIS — Z114 Encounter for screening for human immunodeficiency virus [HIV]: Secondary | ICD-10-CM | POA: Diagnosis not present

## 2023-03-20 DIAGNOSIS — R6882 Decreased libido: Secondary | ICD-10-CM | POA: Diagnosis not present

## 2023-03-20 DIAGNOSIS — Z113 Encounter for screening for infections with a predominantly sexual mode of transmission: Secondary | ICD-10-CM | POA: Diagnosis not present

## 2023-03-20 DIAGNOSIS — Z23 Encounter for immunization: Secondary | ICD-10-CM | POA: Diagnosis not present

## 2023-03-20 DIAGNOSIS — N951 Menopausal and female climacteric states: Secondary | ICD-10-CM | POA: Diagnosis not present

## 2023-04-05 ENCOUNTER — Ambulatory Visit: Payer: Medicaid Other | Admitting: Nurse Practitioner

## 2023-04-05 ENCOUNTER — Encounter: Payer: Self-pay | Admitting: Nurse Practitioner

## 2023-04-05 VITALS — BP 127/69 | HR 69 | Temp 98.0°F | Ht 62.0 in | Wt 147.8 lb

## 2023-04-05 DIAGNOSIS — M797 Fibromyalgia: Secondary | ICD-10-CM | POA: Diagnosis not present

## 2023-04-05 DIAGNOSIS — F39 Unspecified mood [affective] disorder: Secondary | ICD-10-CM | POA: Diagnosis not present

## 2023-04-05 DIAGNOSIS — F902 Attention-deficit hyperactivity disorder, combined type: Secondary | ICD-10-CM

## 2023-04-05 DIAGNOSIS — K5900 Constipation, unspecified: Secondary | ICD-10-CM | POA: Insufficient documentation

## 2023-04-05 DIAGNOSIS — F909 Attention-deficit hyperactivity disorder, unspecified type: Secondary | ICD-10-CM | POA: Insufficient documentation

## 2023-04-05 DIAGNOSIS — Z0001 Encounter for general adult medical examination with abnormal findings: Secondary | ICD-10-CM

## 2023-04-05 DIAGNOSIS — F419 Anxiety disorder, unspecified: Secondary | ICD-10-CM

## 2023-04-05 DIAGNOSIS — G901 Familial dysautonomia [Riley-Day]: Secondary | ICD-10-CM

## 2023-04-05 DIAGNOSIS — Z0000 Encounter for general adult medical examination without abnormal findings: Secondary | ICD-10-CM

## 2023-04-05 DIAGNOSIS — G43E11 Chronic migraine with aura, intractable, with status migrainosus: Secondary | ICD-10-CM

## 2023-04-05 DIAGNOSIS — Z136 Encounter for screening for cardiovascular disorders: Secondary | ICD-10-CM | POA: Diagnosis not present

## 2023-04-05 DIAGNOSIS — R141 Gas pain: Secondary | ICD-10-CM | POA: Insufficient documentation

## 2023-04-05 MED ORDER — DICYCLOMINE HCL 10 MG PO CAPS: 10.0000 mg | ORAL_CAPSULE | Freq: Three times a day (TID) | ORAL | 0 refills | Status: DC

## 2023-04-05 MED ORDER — TIZANIDINE HCL 4 MG PO TABS: 4.0000 mg | ORAL_TABLET | Freq: Once | ORAL | 2 refills | Status: AC

## 2023-04-05 NOTE — Progress Notes (Signed)
New Patient Office Visit  Subjective    Patient ID: Casey Cox, female    DOB: September 03, 1975  Age: 47 y.o. MRN: [id number]  CC:  Chief Complaint  Patient presents with   Establish Care    HPI Casey Cox is a 47 yrs-old female presents on April 05, 2023 with her wife to establish care and in need of medication refill.  She has an extensive past medical history some of the diagnoses are confirmed and some are still pending. She is requesting genetic study for Hypermobile Ehlers-Danlos Syndrome (HEDS), Dysautonomia, endometriosis, pelvic floor dysfunction, TMJ, migraine with aura, anxiety disorder, ADHD, gastroparesis, , IBS, Complex PTSD, OCD,  interstitial cystitis. Unconfirmed dx: PMDD, lungs COVID, insomnia, lipedema, MCAS, hormone dysfunction  Bilateral lung breast History of breast lump has a mammogram schedule  CPTSD/anxiety/OCC/panic attacks Casey Cox reported history of complex PTSD, anxiety, ADHD, OCD and panic disorder.  Currently being managed with Wellbutrin 450 mg  daily, propranolol 20 mg daily and reports that she has been on Xanax but has not had any for over a year and would like to get a new prescription.  Based on client complex mental health diagnosis, it was advised for her to get a referral to psychiatry so they can make better manage her complex PTSD.   Fibromyalgia/ Hypermobile Ehlers-Danlos Syndrome (hEDS)/osteoarthritis: She is requesting to be placed on Suboxone for pain.  Explained to her that the clinic does not continue not dispense Suboxone and agreed to be referred to pain management. She is requesting a refill for Zanaflex.  She also brought a handicap like a form to be filled out  Migraine: She has a history of migraine currently being managed with Maxalt 10 mg as needed.  Report greater than 15 migraine a month.  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  Gastroparesis: She has history of gastroparesis being managed with Bentyl and requesting to have that refill  She is requesting to have tetanus booster however based on her record she already had it last month  LMP 03/05/2023 Outpatient Encounter Medications as of 04/05/2023  Medication Sig   Acetylcysteine (NAC PO) Take by mouth.   ALPRAZolam (XANAX) 0.5 MG tablet Take 0.5 mg by mouth 2 (two) times daily as needed for sleep.    b complex vitamins capsule Take 1 capsule by mouth daily.   Barberry-Oreg Grape-Goldenseal (BERBERINE COMPLEX PO) Take by mouth.   buPROPion (WELLBUTRIN XL) 300 MG 24 hr tablet Take 300 mg by mouth daily.   buPROPion (ZYBAN) 150 MG 12 hr tablet Take 150 mg by mouth 2 (two) times daily.   Calcium Polycarbophil (FIBER-CAPS PO) Take by mouth daily.   cetirizine (ZYRTEC) 10 MG chewable tablet Chew 10 mg by mouth daily.   Cholecalciferol (VITAMIN D3 PO) Take 4,000 Int'l Units by mouth every morning.   co-enzyme Q-10 30 MG capsule Take 30 mg by mouth 3 (three) times daily.   CREATINE PO Take by mouth.   dicyclomine (BENTYL) 10 MG capsule Take 1 capsule (10 mg total) by mouth 4 (four) times daily -  before meals and at bedtime.   docusate sodium (COLACE) 50 MG capsule Take 50 mg by mouth 2 (two) times daily.   estradiol (VIVELLE-DOT) 0.0375 MG/24HR 1 patch.   famotidine (PEPCID)  10 MG tablet Take 10 mg by mouth 2 (two) times daily.   levonorgestrel (MIRENA) 20 MCG/24HR IUD 1 each by Intrauterine route once. PLACED 09/ 2015   magnesium 30 MG tablet Take 30 mg by mouth 2 (two) times daily.   Melatonin 10 MG CHEW Chew by mouth.   Misc Natural Products (FIBER 7 PO) Take by mouth.   Multiple Vitamin (MULTIVITAMIN) tablet Take 1 tablet by mouth daily.   Omega-3 Fatty Acids (OMEGA 3 500 PO)    ondansetron (ZOFRAN) 4 MG tablet Take 4 mg by mouth every 8 (eight) hours as needed for nausea or  vomiting.   ondansetron (ZOFRAN) 4 MG tablet Take 2 tablets (8 mg total) by mouth every 8 (eight) hours as needed for nausea or vomiting.   PREMARIN vaginal cream 0.5 G PER VAGINA NIGHTLY 2-3 TIMES PER WEEK   Probiotic Product (PROBIOTIC DAILY PO) Take 1 capsule by mouth every morning.   propranolol (INDERAL) 20 MG tablet Take 20 mg by mouth 3 (three) times daily.   rizatriptan (MAXALT) 10 MG tablet Take 10 mg by mouth as needed for migraine. May repeat in 2 hours if needed   tiZANidine (ZANAFLEX) 4 MG tablet Take 1 tablet (4 mg total) by mouth once for 1 dose.   Turmeric (QC TUMERIC COMPLEX PO) Take by mouth.   [DISCONTINUED] DULoxetine (CYMBALTA) 60 MG capsule Take 60 mg by mouth every morning.   [DISCONTINUED] fluticasone (FLONASE) 50 MCG/ACT nasal spray Place 1 spray into both nostrils daily as needed for allergies or rhinitis.   [DISCONTINUED] ibuprofen (ADVIL,MOTRIN) 800 MG tablet Take 1 tablet (800 mg total) by mouth every 6 (six) hours as needed.   [DISCONTINUED] oxyCODONE-acetaminophen (ROXICET) 5-325 MG tablet Take 1-2 tablets by mouth every 6 (six) hours as needed for severe pain.   [DISCONTINUED] Specialty Vitamins Products (BIOTIN PLUS KERATIN PO) Take 1 tablet by mouth daily.   [DISCONTINUED] spironolactone (ALDACTONE) 100 MG tablet Take 100 mg by mouth every evening.    [DISCONTINUED] tiZANidine (ZANAFLEX) 4 MG tablet Take by mouth.   [DISCONTINUED] dicyclomine (BENTYL) 10 MG capsule Take 10 mg by mouth 4 (four) times daily -  before meals and at bedtime. (Patient not taking: Reported on 04/05/2023)   No facility-administered encounter medications on file as of 04/05/2023.    Past Medical History:  Diagnosis Date   Acne    sees dermatologist   Anxiety and depression    Ehlers-Danlos syndrome    per pt dx with her rheuamtologist  (joint hypermobility, connective tissue disorder)   Endometriosis    Fibromyalgia    History of concussion    2009-- no residual   History of  melasma    skin depigmentation   Interstitial cystitis    Migraine    Osteoarthritis    jaw, left ankle, knees   Panic disorder    hx pain attacks   PCOS (polycystic ovarian syndrome)    Pelvic pain in female    Seasonal allergies    Subclinical hyperthyroidism    sees endocrinologist--- DR Elvera Lennox   TMJ syndrome     Past Surgical History:  Procedure Laterality Date   ANKLE RECONSTRUCTION Left 04/08/2014   Procedure: RECONSTRUCTION LEFT ANKLE, REPAIR SECONDARY DISRUPTED LIGAMENT ANKLE COLLATERAL, TRANSPLANTERIOR/TRANSFER TENDON TIBIAL EXTENSOR INTO MIDFOOT DEEP;  Surgeon: Thera Flake., MD;  Location: [facility];  Service: Orthopedics;  Laterality: Left;   ARTHROTOMY Bilateral 12/23/2013   Procedure: BILATERAL ARTHROTOMY, MENISECTOMY, TEMPORALIS FASCIA GRAFT;  Surgeon:  Georgia Lopes, DDS;  Location: [facility];  Service: Oral Surgery;  Laterality: Bilateral;   TMJ   LAPAROSCOPY N/A 06/24/2013   Procedure: LAPAROSCOPY OPERATIVE WITH CHROMOPERTUBATION, LYSIS OF ADHESIONS AND APPENDECTOMY ;  Surgeon: Mitchel Honour, DO;  Location: [facility];  Service: Gynecology;  Laterality: N/A;   LAPAROSCOPY N/A 08/07/2016   Procedure: LAPAROSCOPY OPERATIVE, FULGERATION  OF ENDOMETRIOSIS ;  Surgeon: Mitchel Honour, DO;  Location: [facility];  Service: Gynecology;  Laterality: N/A;   LIGAMENT REPAIR Right 01/13/2014   Procedure: RIGHT ANKLE REPAIR SECONDAY DISRUPTED LIGAMENT ANKLE  COLLATERAL WITH PERONEAL TENDON AUTOGRAFT;  Surgeon: Thera Flake., MD;  Location: [facility];  Service: Orthopedics;  Laterality: Right;   TMJ ARTHROCENTESIS Bilateral 07-04-2012   w/  IV Sedation in office   WISDOM TOOTH EXTRACTION  2004    Family History  Problem Relation Age of Onset   Fibromyalgia Mother    Arthritis Mother    Cancer Mother        SKIN CA   Diabetes Father    Arthritis Father    Mental illness Maternal Grandmother    Cancer Maternal Grandfather         PROSTATE CA   Diabetes Maternal Grandfather    Diabetes Paternal Grandmother    Hyperlipidemia Paternal Grandmother    Hypertension Paternal Grandmother    Hypertension Paternal Grandfather    Arthritis Paternal Grandfather    Heart disease Paternal Grandfather     Social History   Socioeconomic History   Marital status: Married    Spouse name: Not on file   Number of children: Not on file   Years of education: Not on file   Highest education level: Some college, no degree  Occupational History   Not on file  Tobacco Use   Smoking status: Never   Smokeless tobacco: Never  Substance and Sexual Activity   Alcohol use: Not Currently   Drug use: No   Sexual activity: Yes    Partners: Female    Birth control/protection: I.U.D.    Comment: partner  Other Topics Concern   Not on file  Social History Narrative   Work or School: full time [employer] Situation: lives with female partner - trying for a child      Spiritual Beliefs:      Lifestyle:               Social Determinants of Health   Financial Resource Strain: High Risk (04/04/2023)   Overall Financial Resource Strain (CARDIA)    Difficulty of Paying Living Expenses: Hard  Food Insecurity: No Food Insecurity (04/04/2023)   Hunger Vital Sign    Worried About Running Out of Food in the Last Year: Never true    Ran Out of Food in the Last Year: Never true  Transportation Needs: No Transportation Needs (04/04/2023)   PRAPARE - Administrator, Civil Service (Medical): No    Lack of Transportation (Non-Medical): No  Physical Activity: Unknown (04/04/2023)   Exercise Vital Sign    Days of Exercise per Week: Patient declined    Minutes of Exercise per Session: Not on file  Stress: Stress Concern Present (04/04/2023)   [employer] of Occupational Health - Occupational Stress Questionnaire    Feeling of Stress : Very much  Social Connections: Unknown (04/04/2023)   Social Connection and Isolation  Panel [NHANES]    Frequency of Communication with Friends and Family:  More than three times a week    Frequency of Social Gatherings with Friends and Family: Patient declined    Attends Religious Services: Patient declined    Active Member of Clubs or Organizations: Patient declined    Attends Banker Meetings: Not on file    Marital Status: Married  Intimate Partner Violence: Not At Risk (10/25/2020)   Received from [facility] visits prior to 07/29/2022., [facility] visits prior to 07/29/2022.   Humiliation, Afraid, Rape, and Kick questionnaire    Fear of Current or Ex-Partner: No    Emotionally Abused: No    Physically Abused: No    Sexually Abused: No    Review of Systems  Constitutional:  Negative for chills and fever.  HENT:  Negative for congestion and sore throat.   Respiratory:  Negative for cough and shortness of breath.   Cardiovascular:  Negative for chest pain and leg swelling.  Gastrointestinal:  Negative for nausea and vomiting.  Genitourinary:  Negative for frequency, hematuria and urgency.  Musculoskeletal:  Positive for myalgias.  Skin:  Negative for itching and rash.  Neurological:  Positive for headaches. Negative for dizziness.       Currently being managed with Maxalt greater than 15 migraine a month  Endo/Heme/Allergies:  Negative for environmental allergies and polydipsia. Does not bruise/bleed easily.  Psychiatric/Behavioral:  Negative for substance abuse and suicidal ideas.    Negative unless indicated in HPI   Objective    BP 127/69   Pulse 69   Temp 98 F (36.7 C) (Temporal)   Ht 5\' 2"  (1.575 m)   Wt 147 lb 12.8 oz (67 kg)   SpO2 98%   BMI 27.03 kg/m   Physical Exam Vitals and nursing note reviewed.  Constitutional:      Appearance: Normal appearance.  HENT:     Head: Normocephalic and atraumatic.     Nose: Nose normal. No congestion or rhinorrhea.  Eyes:     General: No scleral  icterus.    Extraocular Movements: Extraocular movements intact.     Conjunctiva/sclera: Conjunctivae normal.     Pupils: Pupils are equal, round, and reactive to light.  Cardiovascular:     Rate and Rhythm: Normal rate and regular rhythm.  Pulmonary:     Effort: Pulmonary effort is normal.     Breath sounds: Normal breath sounds.  Abdominal:     General: Bowel sounds are normal.     Palpations: Abdomen is soft.  Musculoskeletal:        General: Normal range of motion.     Cervical back: Normal range of motion and neck supple.     Right lower leg: No edema.     Left lower leg: No edema.     Comments: He is a cane for ambulation  Skin:    General: Skin is warm and dry.     Findings: No rash.  Neurological:     Mental Status: She is alert and oriented to person, place, and time. Mental status is at baseline.  Psychiatric:        Attention and Perception: Attention and perception normal.        Mood and Affect: Affect is flat. Affect is not angry.        Speech: Speech normal.        Behavior: Behavior is cooperative.        Thought Content: Thought content does not include homicidal or suicidal ideation. Thought  content does not include homicidal or suicidal plan.        Cognition and Memory: Cognition and memory normal.        Judgment: Judgment normal.     Comments: She was very dismissive, and was bothered, with the writer questions     Last CBC Lab Results  Component Value Date   WBC 10.4 04/03/2021   HGB 15.4 (H) 04/03/2021   HCT 41.9 04/03/2021   MCV 90.1 04/03/2021   MCH 33.1 04/03/2021   RDW 11.5 04/03/2021   PLT 475 (H) 04/03/2021   Last metabolic panel Lab Results  Component Value Date   GLUCOSE 138 (H) 04/03/2021   NA 138 04/03/2021   K 3.3 (L) 04/03/2021   CL 102 04/03/2021   CO2 23 04/03/2021   BUN 9 04/03/2021   CREATININE 0.77 04/03/2021   GFRNONAA >60 04/03/2021   CALCIUM 9.5 04/03/2021   PROT 7.4 04/03/2021   ALBUMIN 4.8 04/03/2021   BILITOT  0.6 04/03/2021   ALKPHOS 56 04/03/2021   AST 14 (L) 04/03/2021   ALT 14 04/03/2021   ANIONGAP 13 04/03/2021   Last lipids Lab Results  Component Value Date   CHOL 130 05/19/2014   HDL 47.80 05/19/2014   LDLCALC 67 05/19/2014   TRIG 74.0 05/19/2014   CHOLHDL 3 05/19/2014   Last hemoglobin A1c Lab Results  Component Value Date   HGBA1C 5.4 11/14/2013   Last thyroid functions Lab Results  Component Value Date   TSH 0.35 05/08/2014        Assessment & Plan:  Routine medical exam -     CBC with Differential/Platelet -     CMP14+EGFR -     Lipid panel -     Thyroid Panel With TSH  Encounter for general adult medical examination with abnormal findings -     CBC with Differential/Platelet -     CMP14+EGFR -     Lipid panel -     Thyroid Panel With TSH -     HepB+HepC+HIV Panel  Attention deficit hyperactivity disorder (ADHD), combined type -     Ambulatory referral to Psychiatry  Anxiety -     Ambulatory referral to Psychiatry  Anxiety, Depression, Insomnia, Panic disorder -     Ambulatory referral to Psychiatry  Fibromyalgia -     Ambulatory referral to Pain Clinic  Intractable chronic migraine with aura with status migrainosus -     Ambulatory referral to Neurology  Dysautonomia [facility])  Other orders -     Dicyclomine HCl; Take 1 capsule (10 mg total) by mouth 4 (four) times daily -  before meals and at bedtime.  Dispense: 90 capsule; Refill: 0 -     tiZANidine HCl; Take 1 tablet (4 mg total) by mouth once for 1 dose.  Dispense: 30 tablet; Refill: 2  Casey Cox is is a 47 year old Caucasian female, no acute distress  - Due to client complex mental health diagnoses she has been referred to psychiatry so they can address her ADHD, complex PTSD, OCD, anxiety and panic attack and restart Xanax -Migraine with aura: Continue Maxalt since client is concerned about CFS coming out of her nose for 5 years she is referred to neurology.  Fibromyalgia/osteoarthritis/HEDS:  Referred to pain management for possible Suboxone Labs: CBC, CMP, lipid, TSH, HepB+HepC+HIV Panel Future: She wants to run Epstein-Barr panel at her next follow-up appointment  All question answered Encourage healthy lifestyle choices, including diet (rich in fruits, vegetables, and lean proteins, and low  in salt and simple carbohydrates) and exercise (at least 30 minutes of moderate physical activity daily).     The above assessment and management plan was discussed with the patient. The patient verbalized understanding of and has agreed to the management plan. Patient is aware to call the clinic if they develop any new symptoms or if symptoms persist or worsen. Patient is aware when to return to the clinic for a follow-up visit. Patient educated on when it is appropriate to go to the emergency department.   Return in about 6 weeks (around 05/17/2023) for physical.   Martina Sinner, DNP [facility] [address] [place], [place] [address] [phone]

## 2023-04-06 LAB — THYROID PANEL WITH TSH
Free Thyroxine Index: 2.4 (ref 1.2–4.9)
T3 Uptake Ratio: 29 % (ref 24–39)
T4, Total: 8.2 ug/dL (ref 4.5–12.0)
TSH: 0.729 u[IU]/mL (ref 0.450–4.500)

## 2023-04-06 LAB — CMP14+EGFR
ALT: 10 [IU]/L (ref 0–32)
AST: 12 [IU]/L (ref 0–40)
Albumin: 4.4 g/dL (ref 3.9–4.9)
Alkaline Phosphatase: 70 [IU]/L (ref 44–121)
BUN/Creatinine Ratio: 16 (ref 9–23)
BUN: 12 mg/dL (ref 6–24)
Bilirubin Total: 0.6 mg/dL (ref 0.0–1.2)
CO2: 24 mmol/L (ref 20–29)
Calcium: 9.7 mg/dL (ref 8.7–10.2)
Chloride: 105 mmol/L (ref 96–106)
Creatinine, Ser: 0.75 mg/dL (ref 0.57–1.00)
Globulin, Total: 2.1 g/dL (ref 1.5–4.5)
Glucose: 103 mg/dL — ABNORMAL HIGH (ref 70–99)
Potassium: 4.9 mmol/L (ref 3.5–5.2)
Sodium: 141 mmol/L (ref 134–144)
Total Protein: 6.5 g/dL (ref 6.0–8.5)
eGFR: 100 mL/min/{1.73_m2} (ref >59)

## 2023-04-06 LAB — LIPID PANEL
Chol/HDL Ratio: 2 ratio (ref 0.0–4.4)
Cholesterol, Total: 131 mg/dL (ref 100–199)
HDL: 66 mg/dL (ref >39)
LDL Chol Calc (NIH): 53 mg/dL (ref 0–99)
Triglycerides: 51 mg/dL (ref 0–149)
VLDL Cholesterol Cal: 12 mg/dL (ref 5–40)

## 2023-04-06 LAB — CBC WITH DIFFERENTIAL/PLATELET
Basophils Absolute: 0.1 10*3/uL (ref 0.0–0.2)
Basos: 1 %
EOS (ABSOLUTE): 0.2 10*3/uL (ref 0.0–0.4)
Eos: 3 %
Hematocrit: 39.3 % (ref 34.0–46.6)
Hemoglobin: 13.4 g/dL (ref 11.1–15.9)
Immature Grans (Abs): 0 10*3/uL (ref 0.0–0.1)
Immature Granulocytes: 0 %
Lymphocytes Absolute: 1.9 10*3/uL (ref 0.7–3.1)
Lymphs: 25 %
MCH: 32.3 pg (ref 26.6–33.0)
MCHC: 34.1 g/dL (ref 31.5–35.7)
MCV: 95 fL (ref 79–97)
Monocytes Absolute: 0.6 10*3/uL (ref 0.1–0.9)
Monocytes: 8 %
Neutrophils Absolute: 4.7 10*3/uL (ref 1.4–7.0)
Neutrophils: 63 %
Platelets: 369 10*3/uL (ref 150–450)
RBC: 4.15 x10E6/uL (ref 3.77–5.28)
RDW: 12.3 % (ref 11.7–15.4)
WBC: 7.5 10*3/uL (ref 3.4–10.8)

## 2023-04-06 LAB — HEPB+HEPC+HIV PANEL
HIV Screen 4th Generation wRfx: NONREACTIVE
Hep B C IgM: NEGATIVE
Hep B Core Total Ab: NEGATIVE
Hep B E Ab: NONREACTIVE
Hep B E Ag: NEGATIVE
Hep B Surface Ab, Qual: REACTIVE
Hep C Virus Ab: NONREACTIVE
Hepatitis B Surface Ag: NEGATIVE

## 2023-04-18 ENCOUNTER — Ambulatory Visit: Payer: PRIVATE HEALTH INSURANCE

## 2023-04-18 ENCOUNTER — Ambulatory Visit
Admission: RE | Admit: 2023-04-18 | Discharge: 2023-04-18 | Disposition: A | Discharge status: Home / Self Care | Payer: PRIVATE HEALTH INSURANCE | Source: Ambulatory Visit | Attending: Family | Admitting: Family

## 2023-04-18 ENCOUNTER — Ambulatory Visit
Admission: RE | Admit: 2023-04-18 | Discharge: 2023-04-18 | Disposition: A | Discharge status: Home / Self Care | Payer: Medicaid Other | Source: Ambulatory Visit | Attending: Family | Admitting: Family

## 2023-04-18 DIAGNOSIS — N644 Mastodynia: Secondary | ICD-10-CM

## 2023-04-19 ENCOUNTER — Encounter: Payer: Self-pay | Admitting: Neurology

## 2023-04-19 ENCOUNTER — Encounter: Payer: Self-pay | Admitting: Physical Medicine and Rehabilitation

## 2023-04-26 ENCOUNTER — Other Ambulatory Visit: Payer: Self-pay | Admitting: Nurse Practitioner

## 2023-05-07 ENCOUNTER — Encounter
Payer: Medicaid Other | Attending: Physical Medicine and Rehabilitation | Admitting: Physical Medicine and Rehabilitation

## 2023-05-07 VITALS — BP 112/80 | HR 67 | Ht 62.0 in | Wt 145.0 lb

## 2023-05-07 DIAGNOSIS — M533 Sacrococcygeal disorders, not elsewhere classified: Secondary | ICD-10-CM | POA: Diagnosis not present

## 2023-05-07 DIAGNOSIS — G4700 Insomnia, unspecified: Secondary | ICD-10-CM

## 2023-05-07 DIAGNOSIS — M797 Fibromyalgia: Secondary | ICD-10-CM | POA: Diagnosis not present

## 2023-05-07 DIAGNOSIS — G894 Chronic pain syndrome: Secondary | ICD-10-CM | POA: Diagnosis not present

## 2023-05-07 DIAGNOSIS — G8929 Other chronic pain: Secondary | ICD-10-CM | POA: Diagnosis present

## 2023-05-07 DIAGNOSIS — Q796 Ehlers-Danlos syndrome, unspecified: Secondary | ICD-10-CM

## 2023-05-07 MED ORDER — LIDOCAINE 5 % EX PTCH: 1.0000 | MEDICATED_PATCH | CUTANEOUS | 2 refills | Status: AC

## 2023-05-07 NOTE — Progress Notes (Signed)
Subjective:    Patient ID: Casey Cox, female    DOB: 1975-08-22, 47 y.o.   MRN: [id number]  HPI HPI  Casey Cox is a 47 y.o. year old female  who  has a past medical history of Acne, Anxiety and depression, Ehlers-Danlos syndrome, Endometriosis, Fibromyalgia, History of concussion, History of melasma, Interstitial cystitis, Migraine, Osteoarthritis, Panic disorder, PCOS (polycystic ovarian syndrome), Pelvic pain in female, Seasonal allergies, Subclinical hyperthyroidism, and TMJ syndrome.   They are presenting to PM&R clinic as a new patient for pain management evaluation. They were referred by Santa Lighter NP for treatment of FM/hEDS pain.   Per their last note: Fibromyalgia/ Hypermobile Ehlers-Danlos Syndrome (hEDS)/osteoarthritis: She is requesting to be placed on Suboxone for pain.  Explained to her that the clinic does not continue not dispense Suboxone and agreed to be referred to pain management. She is requesting a refill for Zanaflex.  She also brought a handicap like a form to be filled out   Migraine: She has a history of migraine currently being managed with Maxalt 10 mg as needed.  Report greater than 15 migraine a month.  She also reports that she has been leaking CSF fluid out of her nose for over 5 years and is requesting a referral to be sent to neurology "they need to find out why I am having all migraines and I am having".   Gastroparesis: She has history of gastroparesis being managed with Bentyl and requesting to have that refill  Casey Cox is is a 47 year old Caucasian female, no acute distress  - Due to client complex mental health diagnoses she has been referred to psychiatry so they can address her ADHD, complex PTSD, OCD, anxiety and panic attack and restart Xanax -Migraine with aura: Continue Maxalt since client is concerned about CFS coming out of her nose for 5 years she is referred to neurology.   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   Description of pain: constant, dull, and aching with intermittent sharp throbbing Exacerbating factors:  stress, changing position, lack of sleep, car rides , sitting, rest, cold, and heat Remitting factors: heating pad, hot bath, and PT and lymphatic drainage, TENs unit Red flag symptoms: No red flags for back pain endorsed in Hx or ROS  Medications tried: Topical medications (no effect) : "I have salves that I use" . Tried voltaren without effect.  Nsaids (no effect) : Stopped taking 7 years ago because of GI issues; unsure if it was from IBS at that time Tylenol  (no effect) : Will use occasionally for headaches, not migraines Opiates  (mild effect) : Tramadol works occasionally, was given 50 mg tabs in the past.  Gabapentin / Lyrica  (unsure of effect) : Gabapentin "did not work well for me"; unsure if no effect or if side effect. Has not tried Lyrica TCAs  (No effect) : Elavil in the past, did not help. No s/e  SNRIs  (no effect) : Tried Cymbalta, did not help and and the withdrawal was horrible Other  (moderate effect) : Muscle relaxers mildlyl helpful for sleep - used  Zanaflex 1 pill at bedtime + 1 pill PRN. Flexeril not as effective   She is interested in low dose naltrexone for pain management  Other treatments: PT/OT  (excellent effect) : PT was "immensely helpful". Stopped in 2019. Never tried aquatherapy but does tub exercises at home.  Accupuncture/chiropractor/massage  (moderate effect) : Acupuncture helped; stopped because of insurance coverage. Didn't like  chiropractic, manipulations weren't helpful and was expensive.  TENs unit (moderate effect) : Has one at home, uses a few times per week on her SI joint and shoulders Injections (no effect) : Knees and hips - [place], unsure if they helped. 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 Uses bilateral knee, wrist, and ankle braces and a "body braid" to help prevent.   Notable family + genetic Dx EDS but unsure what subtypes.   + trouble falling and staying asleep. Does not take naps, does not fall asleep because "I modify so I dont have to drive around." No Hx snoring.    Prior UDS results: No results found for: "LABOPIA", "COCAINSCRNUR", "LABBENZ", "AMPHETMU", "THCU", "LABBARB"    Pain Inventory Average Pain 8 Pain Right Now 8 My pain is constant, sharp, burning, dull, stabbing, tingling, and aching  In the last 24 hours, has pain interfered with the following? General activity 10 Relation with others 10 Enjoyment of life 10 What TIME of day is your pain at its worst? morning  and night Sleep (in general) Poor  Pain is worse with: walking, bending, sitting, inactivity, standing, and some activites Pain improves with: rest, heat/ice, therapy/exercise, pacing activities, and medication Relief from Meds: 1  use a cane use a walker how many minutes can you walk? Depends on the day ability to climb steps?  yes do you drive?  yes use a wheelchair  disabled: date disabled 2019 I need assistance with the following:  meal prep,  household duties, and shopping  weakness numbness tremor tingling trouble walking spasms dizziness depression anxiety  New pt  New pt    Family History  Problem Relation Age of Onset   Fibromyalgia Mother    Arthritis Mother    Cancer Mother        SKIN CA   Diabetes Father    Arthritis Father    Mental illness Maternal Grandmother    Cancer Maternal Grandfather        PROSTATE CA   Diabetes Maternal Grandfather    Diabetes Paternal Grandmother    Hyperlipidemia Paternal Grandmother    Hypertension Paternal Grandmother    Hypertension Paternal Grandfather    Arthritis Paternal Grandfather    Heart disease Paternal Grandfather    Social History   Socioeconomic History   Marital status: Married    Spouse name: Not on file   Number of children: Not on file   Years of education: Not on file   Highest education level: Some college, no degree  Occupational History   Not on file  Tobacco Use   Smoking status: Never  Smokeless tobacco: Never  Substance and Sexual Activity   Alcohol use: Not Currently   Drug use: No   Sexual activity: Yes    Partners: Female    Birth control/protection: I.U.D.    Comment: partner  Other Topics Concern   Not on file  Social History Narrative   Work or School: full time [employer] Situation: lives with female partner - trying for a child      Spiritual Beliefs:      Lifestyle:               Social Determinants of Health   Financial Resource Strain: High Risk (04/04/2023)   Overall Financial Resource Strain (CARDIA)    Difficulty of Paying Living Expenses: Hard  Food Insecurity: No Food Insecurity (04/04/2023)   Hunger Vital Sign    Worried About Running Out of Food in the Last Year: Never true    Ran Out of Food in the Last Year: Never true  Transportation Needs: No Transportation Needs (04/04/2023)   PRAPARE - Administrator, Civil Service (Medical): No    Lack of Transportation (Non-Medical): No   Physical Activity: Unknown (04/04/2023)   Exercise Vital Sign    Days of Exercise per Week: Patient declined    Minutes of Exercise per Session: Not on file  Stress: Stress Concern Present (04/04/2023)   Casey Cox    Feeling of Stress : Very much  Social Connections: Unknown (04/04/2023)   Social Connection and Isolation Panel [NHANES]    Frequency of Communication with Friends and Family: More than three times a week    Frequency of Social Gatherings with Friends and Family: Patient declined    Attends Religious Services: Patient declined    Database administrator or Organizations: Patient declined    Attends Engineer, structural: Not on file    Marital Status: Married   Past Surgical History:  Procedure Laterality Date   ANKLE RECONSTRUCTION Left 04/08/2014   Procedure: RECONSTRUCTION LEFT ANKLE, REPAIR SECONDARY DISRUPTED LIGAMENT ANKLE COLLATERAL, TRANSPLANTERIOR/TRANSFER TENDON TIBIAL EXTENSOR INTO MIDFOOT DEEP;  Surgeon: Thera Flake., MD;  Location: [facility];  Service: Orthopedics;  Laterality: Left;   ARTHROTOMY Bilateral 12/23/2013   Procedure: BILATERAL ARTHROTOMY, MENISECTOMY, TEMPORALIS FASCIA GRAFT;  Surgeon: Georgia Lopes, DDS;  Location: [facility];  Service: Oral Surgery;  Laterality: Bilateral;   TMJ   LAPAROSCOPY N/A 06/24/2013   Procedure: LAPAROSCOPY OPERATIVE WITH CHROMOPERTUBATION, LYSIS OF ADHESIONS AND APPENDECTOMY ;  Surgeon: Mitchel Honour, DO;  Location: [facility];  Service: Gynecology;  Laterality: N/A;   LAPAROSCOPY N/A 08/07/2016   Procedure: LAPAROSCOPY OPERATIVE, FULGERATION  OF ENDOMETRIOSIS ;  Surgeon: Mitchel Honour, DO;  Location: [facility];  Service: Gynecology;  Laterality: N/A;   LIGAMENT REPAIR Right 01/13/2014   Procedure: RIGHT ANKLE REPAIR SECONDAY DISRUPTED LIGAMENT ANKLE  COLLATERAL WITH PERONEAL TENDON AUTOGRAFT;  Surgeon: Thera Flake., MD;   Location: [facility];  Service: Orthopedics;  Laterality: Right;   TMJ ARTHROCENTESIS Bilateral 07-04-2012   w/  IV Sedation in office   WISDOM TOOTH EXTRACTION  2004   Past Medical History:  Diagnosis Date   Acne    sees dermatologist   Anxiety and depression    Ehlers-Danlos syndrome    per pt dx with her rheuamtologist  (joint hypermobility, connective tissue disorder)   Endometriosis    Fibromyalgia  History of concussion    2009-- no residual   History of melasma    skin depigmentation   Interstitial cystitis    Migraine    Osteoarthritis    jaw, left ankle, knees   Panic disorder    hx pain attacks   PCOS (polycystic ovarian syndrome)    Pelvic pain in female    Seasonal allergies    Subclinical hyperthyroidism    sees endocrinologist--- DR Elvera Lennox   TMJ syndrome    There were no vitals taken for this visit.  Opioid Risk Score:   Fall Risk Score:  `1  Depression screen [facility] 2/9     04/05/2023    8:25 AM  Depression screen PHQ 2/9  Decreased Interest 1  Down, Depressed, Hopeless 1  PHQ - 2 Score 2  Altered sleeping 3  Tired, decreased energy 1  Change in appetite 0  Feeling bad or failure about yourself  0  Trouble concentrating 1  Moving slowly or fidgety/restless 0  Suicidal thoughts 0  PHQ-9 Score 7  Difficult doing work/chores Somewhat difficult      Review of Systems  Constitutional:  Positive for appetite change and diaphoresis.  Gastrointestinal:  Positive for abdominal pain, nausea and vomiting.  Musculoskeletal:  Positive for back pain, gait problem, joint swelling, myalgias and neck pain.  Neurological:  Positive for dizziness, tremors, weakness and numbness.  Psychiatric/Behavioral:  Positive for dysphoric mood. The patient is nervous/anxious.   All other systems reviewed and are negative.     Objective:   Physical Exam   PE: Constitution: Appropriate appearance for age. No apparent distress   Resp: No respiratory  distress. No accessory muscle usage.  Cardio: Well perfused appearance.  No peripheral edema. Psych: Appropriate mood and affect. Neuro: AAOx4. No apparent cognitive deficits   Neurologic Exam:   Sensory exam: revealed normal sensation in all dermatomal regions in bilateral upper extremities, bilateral lower extremities, and with reduced sensation to light touch in bilateral ankles over prior ATF surgery Motor exam: strength 5/5 throughout bilateral upper extremities and bilateral lower extremities Coordination: Fine motor coordination was normal.   Gait:  Mild R trendelenberg with use of cane in R hand    MSK: + TTP throughout greatest at bilateral PSIS, SI joints, and rhomboids  Beighton Score for Hypermobility (tested bilaterally, with Hx + for older adults):  Little finger beyond 90 degrees 2 / 2 Thumb back to touch forearm 2/2 Elbow hyperextension beyond 10 degrees 2/2 Knee hyperextension beyond 10 degrees 0/2 Hands on the floor with knees straight 1/1  Total score : 7 (5/9 is a positive test for hypermobility)      Assessment & Plan:   Casey Cox is a 47 y.o. year old female  who  has a past medical history of Acne, Anxiety and depression, Ehlers-Danlos syndrome, Endometriosis, Fibromyalgia, History of concussion, History of melasma, Interstitial cystitis, Migraine, Osteoarthritis, Panic disorder, PCOS (polycystic ovarian syndrome), Pelvic pain in female, Seasonal allergies, Subclinical hyperthyroidism, and TMJ syndrome.   They are presenting to PM&R clinic as a new patient for pain management due to FM, Hypermobility (family Hx +EDS, ?subtype) . Notable addition Hx migraines with referral to neurology for ?CSF leak.   Chronic pain syndrome Fibromyalgia I will message you once your naltrexone script is sent in to a compounding pharmacy; cost is generally between $30-70 per month out of pocket.   Start low dose naltrexone at 1.5 mg dose (1 capsule) on an empty stomach at  night for 1 week. If no side effects, increase to 3 mg (2 capsules) at nighttime for 3 weeks. You will need to call the office or message me through mychart at week 2-3 and report effects before I will prescribe the medication further; based on your response, we may keep 3 mg nightly or increase up to 4.5 mg nightly.   This is NOT an as needed medication, you must take it CONSISTENTLY to appreciate a benefit, but it can be stopped safely without weaning if needed. Call clinic if any questions  I am prescribing lidocaine patches 5% 2 patches daily as needed for pain in your bilateral SI joints.  If insurance does not approve these, you can also use Aspercreme patches over-the-counter.  Follow up with me in 3 months. Can consider TPI in the future if no improvements  Ehlers-Danlos disease  I also gave you information on genetic screening through [employer] for Ehlers-Danlos syndrome.  I recommend calling your insurer and asking how much this would be out-of-pocket, or going to their website and using the [employer] tools to look at how much it will be.  If this is something you wish to consider, contact the clinic and I am happy to order it for you.  I am sending you to aqua therapy to work on gentle mobilization and pain control.  I am also ordering a corset brace to use to help protect her low back during work.  Marland Kitchen

## 2023-05-07 NOTE — Patient Instructions (Addendum)
I will message you once your naltrexone script is sent in to a compounding pharmacy; cost is generally between $30-70 per month out of pocket.   Start low dose naltrexone at 1.5 mg dose (1 capsule) on an empty stomach at night for 1 week. If no side effects, increase to 3 mg (2 capsules) at nighttime for 3 weeks. You will need to call the office or message me through mychart at week 2-3 and report effects before I will prescribe the medication further; based on your response, we may keep 3 mg nightly or increase up to 4.5 mg nightly.   This is NOT an as needed medication, you must take it CONSISTENTLY to appreciate a benefit, but it can be stopped safely without weaning if needed. Call clinic if any questions  I am prescribing lidocaine patches 5% 2 patches daily as needed for pain in your bilateral SI joints.  If insurance does not approve these, you can also use Aspercreme patches over-the-counter.  I also gave you information on genetic screening through [employer] for Ehlers-Danlos syndrome.  I recommend calling your insurer and asking how much this would be out-of-pocket, or going to their website and using the [employer] tools to look at how much it will be.  If this is something you wish to consider, contact the clinic and I am happy to order it for you.  I am sending you to aqua therapy to work on gentle mobilization and pain control.  I am also ordering a corset brace to use to help protect her low back during work.  Follow up with me in 3 months. Can consider TPI in the future if no improvements.

## 2023-05-08 ENCOUNTER — Telehealth: Payer: Self-pay

## 2023-05-08 NOTE — Telephone Encounter (Signed)
PA Lidocaine submitted

## 2023-05-09 NOTE — Telephone Encounter (Signed)
Approved on December 10 by [facility] 2017 NCPDP Request Reference Number: [id number]. LIDOCAINE PAD 5% is approved through 05/07/2024. For further questions, call [employer] at [phone]. Authorization Expiration Date: 05/07/2024

## 2023-05-11 ENCOUNTER — Encounter: Payer: Self-pay | Admitting: Physical Medicine and Rehabilitation

## 2023-05-11 MED ORDER — NALTREXONE HCL (PAIN) 1.5 MG PO CAPS: ORAL_CAPSULE | ORAL | 0 refills | Status: AC

## 2023-05-15 DIAGNOSIS — Z23 Encounter for immunization: Secondary | ICD-10-CM | POA: Diagnosis not present

## 2023-05-17 ENCOUNTER — Encounter: Payer: Self-pay | Admitting: Nurse Practitioner

## 2023-05-17 ENCOUNTER — Telehealth: Payer: Medicaid Other | Admitting: Nurse Practitioner

## 2023-05-17 VITALS — BP 126/75 | HR 59 | Temp 98.0°F | Ht 62.0 in | Wt 142.6 lb

## 2023-05-17 DIAGNOSIS — E0590 Thyrotoxicosis, unspecified without thyrotoxic crisis or storm: Secondary | ICD-10-CM

## 2023-05-17 DIAGNOSIS — G894 Chronic pain syndrome: Secondary | ICD-10-CM

## 2023-05-17 DIAGNOSIS — R142 Eructation: Secondary | ICD-10-CM

## 2023-05-17 DIAGNOSIS — F410 Panic disorder [episodic paroxysmal anxiety] without agoraphobia: Secondary | ICD-10-CM

## 2023-05-17 DIAGNOSIS — Z0001 Encounter for general adult medical examination with abnormal findings: Secondary | ICD-10-CM | POA: Diagnosis not present

## 2023-05-17 DIAGNOSIS — R143 Flatulence: Secondary | ICD-10-CM

## 2023-05-17 DIAGNOSIS — F39 Unspecified mood [affective] disorder: Secondary | ICD-10-CM

## 2023-05-17 DIAGNOSIS — M797 Fibromyalgia: Secondary | ICD-10-CM

## 2023-05-17 DIAGNOSIS — K5900 Constipation, unspecified: Secondary | ICD-10-CM | POA: Diagnosis not present

## 2023-05-17 DIAGNOSIS — Q796 Ehlers-Danlos syndrome, unspecified: Secondary | ICD-10-CM

## 2023-05-17 DIAGNOSIS — R141 Gas pain: Secondary | ICD-10-CM

## 2023-05-17 DIAGNOSIS — F902 Attention-deficit hyperactivity disorder, combined type: Secondary | ICD-10-CM

## 2023-05-17 MED ORDER — TIZANIDINE HCL 4 MG PO CAPS: 4.0000 mg | ORAL_CAPSULE | Freq: Three times a day (TID) | ORAL | 0 refills | Status: DC | PRN

## 2023-05-17 MED ORDER — ONDANSETRON HCL 4 MG PO TABS: 4.0000 mg | ORAL_TABLET | Freq: Three times a day (TID) | ORAL | 0 refills | Status: AC | PRN

## 2023-05-17 MED ORDER — PROPRANOLOL HCL 20 MG PO TABS
20.0000 mg | ORAL_TABLET | Freq: Three times a day (TID) | ORAL | 2 refills | Status: DC
Start: 2023-05-17 — End: 2023-08-03

## 2023-05-17 MED ORDER — BUPROPION HCL ER (SR) 150 MG PO TB12: 150.0000 mg | ORAL_TABLET | Freq: Every day | ORAL | 0 refills | Status: DC

## 2023-05-17 MED ORDER — BUPROPION HCL ER (XL) 300 MG PO TB24: 300.0000 mg | ORAL_TABLET | Freq: Every day | ORAL | 0 refills | Status: DC

## 2023-05-17 NOTE — Progress Notes (Signed)
PCP: Martina Sinner DNP Pain management:Engler, Bobetta Lime, DO   Virtual Visit via video Note Due to COVID-19 pandemic this visit was conducted virtually. This visit type was conducted due to national recommendations for restrictions regarding the COVID-19 Pandemic (e.g. social distancing, sheltering in place) in an effort to limit this patient's exposure and mitigate transmission in our community. All issues noted in this document were discussed and addressed.  A physical exam was not performed with this format.   I connected with Casey Cox on 05/17/2023 at [phone] by name and DOB and verified that I am speaking with the correct person using two identifiers. Casey Cox is currently located at the clinic in an exam room during visit. The provider, Martina Sinner, DNP is in their home at time of visit  I discussed the limitations, risks, security and privacy concerns of performing an evaluation and management service by virtual visit and the availability of in person appointments. I also discussed with the patient that there may be a patient responsible charge related to this service. The patient expressed understanding and agreed to proceed.  Subjective:  Patient ID: Casey Cox, female    DOB: Sep 10, 1975, 47 y.o.   MRN: [id number]  Chief Complaint:  Annual Exam  Patient: Casey Cox   DOB: Aug 22, 1975   47 y.o. Female  MRN: [id number]  Subjective:    Chief Complaint  Patient presents with   Annual Exam   Casey Cox is a 47 y.o. female who presents today for a complete physical exam. She reports consuming a general diet. The patient does not participate in regular exercise at present. She generally feels well. She reports sleeping well. She does have additional problems to discuss today, Needs medication refills and referral to GI, cardiology and psychiatry. Past medical history of Acne, Anxiety and depression, Ehlers-Danlos syndrome,  Endometriosis, Fibromyalgia, History of concussion, History of melasma, Interstitial cystitis, Migraine, Osteoarthritis, Panic disorder, PCOS (polycystic ovarian syndrome), Pelvic pain in female, Seasonal allergies, Subclinical hyperthyroidism, and TMJ syndrome.     Depression She has a past diagnosis of MDD and was seen a psychiatrist prior to transferring her care to this clinic and is requesting a referral to psych because it is closer to home.  Her depression has been managed with Wellbutrin 450 mg daily which was started by her previous psychiatrist.  Client is requesting a refill of that medication today as the next follow-up with the psychiatrist will not be until next year.  She did report that she has only been taking 300 mg of Wellbutrin because the pharmacist did not dispensed both prescription.  History close scripts from previous psychiatrist is very nice follow Wellbutrin 300 mg extended release daily, and Wellbutrin 150 mg sustained-release twice daily.  "Since I was not on Wellbutrin for a while: I was started with 150 daily and was in to increase it after a week to 300 mg daily was changed to 300 mg extended release and 150 daily" the prescription was never updated.  Last PHQ-9 client scored 11 tried to discuss current symptoms     She reports excellent compliance with treatment. She is not having side effects.  She reports excellent tolerance of treatment. Current symptoms include: "I am feeling great I do not think I have any symptoms of depression, my mood is great". She feels she is Unchanged since last visit. Will refill Wellbutrin and refer client to psychiatry.     05/07/2023  1:30 PM 04/05/2023    8:25 AM  Depression screen PHQ 2/9  Decreased Interest 2 1  Down, Depressed, Hopeless 2 1  PHQ - 2 Score 4 2  Altered sleeping 2 3  Tired, decreased energy 2 1  Change in appetite 1 0  Feeling bad or failure about yourself  1 0  Trouble concentrating 1 1  Moving slowly or  fidgety/restless 0 0  Suicidal thoughts 0 0  PHQ-9 Score 11 7  Difficult doing work/chores Extremely dIfficult Somewhat difficult    Ehlers-Danlos syndrome: History of Ehlers-Danlos syndrome (EDS), who reports a history of tachycardia. The patient reports that the tachycardia has been associated with their EDS, though no specific triggers are noted. Due to this condition, they were prescribed propranolol for rate control and symptom management. The patient notes that since starting propranolol, their heart rate (HR) has been normal and stable. There are no current complaints of palpitations, dizziness, or other symptoms associated with tachycardia at this time. The patient is tolerating the medication well with no significant side effects. No other changes in symptoms or new issues have been reported. Will refill Propranolol and refer her to cardiology since her last visit was over 1-yr   Chronic pain Syndrome/Fibromyalgia/EDS/Osteoarthritis At her last visit she was referred to pain management. She she had an appointment with them on May 07, 2023 and was started on Naltrexone for pain. She is requesting to have Zanaflex refill today " I mentioned it to pain management , she didn't say anything. Not sure if she wil refill it or not". From her visit with pain management "Other  (moderate effect) : Muscle relaxers mildlyl helpful for sleep - used Zanaflex 1 pill at bedtime + 1 pill PRN. Flexeril not as effective " she reports that she has been taking TID. Will provide and instruct client to discuss it with pain management.  GI: Reports that has been on Bentyl and Zofran PRN d/t EDS. Reports history of chronic constipation. Bentyl was refilled 04/30/2023 and is requesting refill for Zofran.  She is requesting a referral to GI closer to home.  Subclinical hyperthyroidism history of hyperthyroidism.The thyroid-stimulating hormone (TSH) levels have is been  monitored. The patient denies experiencing any  symptoms typically associated with hypothyroidism, No new symptoms have been reported. Recheck TSH today  Most recent fall risk assessment:    05/07/2023    1:30 PM  Fall Risk   Falls in the past year? 1  Number falls in past yr: 1  Injury with Fall? 0     Most recent depression screenings:    05/07/2023    1:30 PM 04/05/2023    8:25 AM  PHQ 2/9 Scores  PHQ - 2 Score 4 2  PHQ- 9 Score 11 7      05/07/2023    1:30 PM 04/05/2023    8:25 AM  PHQ9 SCORE ONLY  PHQ-9 Total Score 11 7     Vision:Within last year and Dental: No current dental problems  Patient Active Problem List   Diagnosis Date Noted   Chronic pain syndrome 05/07/2023   Insomnia 05/07/2023   Chronic SI joint pain 05/07/2023   ADHD 04/05/2023   Constipation 04/05/2023   Flatulence, eructation and gas pain 04/05/2023   Anxiety 04/16/2018   Panic disorder 04/16/2018   BMI 32.0-32.9,adult 10/01/2014   Anxiety, Depression, Insomnia, Panic disorder 10/01/2014   Ehlers-Danlos disease 05/19/2014   Subclinical hyperthyroidism 02/03/2014   Acne 11/14/2013   Low serum progesterone 11/14/2013  Interstitial cystitis 02/26/2013   Fibromyalgia 04/04/2012   TMJ (temporomandibular joint syndrome) 04/04/2012   Migraines 04/04/2012   Dysautonomia (HCC) 05/29/1990   Past Medical History:  Diagnosis Date   Acne    sees dermatologist   Anxiety and depression    Ehlers-Danlos syndrome    per pt dx with her rheuamtologist  (joint hypermobility, connective tissue disorder)   Endometriosis    Fibromyalgia    History of concussion    2009-- no residual   History of melasma    skin depigmentation   Interstitial cystitis    Migraine    Osteoarthritis    jaw, left ankle, knees   Panic disorder    hx pain attacks   PCOS (polycystic ovarian syndrome)    Pelvic pain in female    Seasonal allergies    Subclinical hyperthyroidism    sees endocrinologist--- DR Elvera Lennox   TMJ syndrome    Past Surgical History:   Procedure Laterality Date   ANKLE RECONSTRUCTION Left 04/08/2014   Procedure: RECONSTRUCTION LEFT ANKLE, REPAIR SECONDARY DISRUPTED LIGAMENT ANKLE COLLATERAL, TRANSPLANTERIOR/TRANSFER TENDON TIBIAL EXTENSOR INTO MIDFOOT DEEP;  Surgeon: Thera Flake., MD;  Location: [facility];  Service: Orthopedics;  Laterality: Left;   ARTHROTOMY Bilateral 12/23/2013   Procedure: BILATERAL ARTHROTOMY, MENISECTOMY, TEMPORALIS FASCIA GRAFT;  Surgeon: Georgia Lopes, DDS;  Location: [facility];  Service: Oral Surgery;  Laterality: Bilateral;   TMJ   LAPAROSCOPY N/A 06/24/2013   Procedure: LAPAROSCOPY OPERATIVE WITH CHROMOPERTUBATION, LYSIS OF ADHESIONS AND APPENDECTOMY ;  Surgeon: Mitchel Honour, DO;  Location: [facility];  Service: Gynecology;  Laterality: N/A;   LAPAROSCOPY N/A 08/07/2016   Procedure: LAPAROSCOPY OPERATIVE, FULGERATION  OF ENDOMETRIOSIS ;  Surgeon: Mitchel Honour, DO;  Location:  SURGERY CENTER;  Service: Gynecology;  Laterality: N/A;   LIGAMENT REPAIR Right 01/13/2014   Procedure: RIGHT ANKLE REPAIR SECONDAY DISRUPTED LIGAMENT ANKLE  COLLATERAL WITH PERONEAL TENDON AUTOGRAFT;  Surgeon: Thera Flake., MD;  Location: [facility];  Service: Orthopedics;  Laterality: Right;   TMJ ARTHROCENTESIS Bilateral 07-04-2012   w/  IV Sedation in office   WISDOM TOOTH EXTRACTION  2004   Social History   Tobacco Use   Smoking status: Never   Smokeless tobacco: Never  Substance Use Topics   Alcohol use: Not Currently   Drug use: No   Social History   Socioeconomic History   Marital status: Married    Spouse name: Not on file   Number of children: Not on file   Years of education: Not on file   Highest education level: Some college, no degree  Occupational History   Not on file  Tobacco Use   Smoking status: Never   Smokeless tobacco: Never  Substance and Sexual Activity   Alcohol use: Not Currently   Drug use: No   Sexual activity: Yes    Partners: Female     Birth control/protection: I.U.D.    Comment: partner  Other Topics Concern   Not on file  Social History Narrative   Work or School: full time [employer] Situation: lives with female partner - trying for a child      Spiritual Beliefs:      Lifestyle:               Social Drivers of Health   Financial Resource Strain: High Risk (05/17/2023)   Overall Financial Resource Strain (CARDIA)    Difficulty of Paying Living  Expenses: Very hard  Food Insecurity: Food Insecurity Present (05/17/2023)   Hunger Vital Sign    Worried About Running Out of Food in the Last Year: Sometimes true    Ran Out of Food in the Last Year: Never true  Transportation Needs: Patient Declined (05/17/2023)   PRAPARE - Transportation    Lack of Transportation (Medical): Patient declined    Lack of Transportation (Non-Medical): Patient declined  Physical Activity: Sufficiently Active (05/17/2023)   Exercise Vital Sign    Days of Exercise per Week: 5 days    Minutes of Exercise per Session: 60 min  Stress: Stress Concern Present (05/17/2023)   [employer] of Occupational Health - Occupational Stress Questionnaire    Feeling of Stress : Very much  Social Connections: Unknown (05/17/2023)   Social Connection and Isolation Panel [NHANES]    Frequency of Communication with Friends and Family: Patient declined    Frequency of Social Gatherings with Friends and Family: Patient declined    Attends Religious Services: Patient declined    Active Member of Clubs or Organizations: Patient declined    Attends Banker Meetings: Not on file    Marital Status: Married  Intimate Partner Violence: Not At Risk (10/25/2020)   Received from [facility] visits prior to 07/29/2022., [facility] visits prior to 07/29/2022.   Humiliation, Afraid, Rape, and Kick questionnaire    Fear of Current or Ex-Partner: No    Emotionally Abused: No    Physically Abused: No     Sexually Abused: No   Family Status  Relation Name Status   Mother  (Not Specified)   Father  (Not Specified)   MGM  (Not Specified)   MGF  (Not Specified)   PGM  (Not Specified)   PGF  (Not Specified)  No partnership data on file   Family History  Problem Relation Age of Onset   Fibromyalgia Mother    Arthritis Mother    Cancer Mother        SKIN CA   Diabetes Father    Arthritis Father    Mental illness Maternal Grandmother    Cancer Maternal Grandfather        PROSTATE CA   Diabetes Maternal Grandfather    Diabetes Paternal Grandmother    Hyperlipidemia Paternal Grandmother    Hypertension Paternal Grandmother    Hypertension Paternal Grandfather    Arthritis Paternal Grandfather    Heart disease Paternal Grandfather    Allergies  Allergen Reactions   Amoxicillin Rash   Nickel Rash      Patient Care Team: [facility] Santa Lighter, Dois Davenport, NP as PCP - General (Nurse Practitioner)   Outpatient Medications Prior to Visit  Medication Sig Note   Acetylcysteine (NAC PO) Take by mouth.    ALPRAZolam (XANAX) 0.5 MG tablet Take 0.5 mg by mouth 2 (two) times daily as needed for sleep.     b complex vitamins capsule Take 1 capsule by mouth daily.    Barberry-Oreg Grape-Goldenseal (BERBERINE COMPLEX PO) Take by mouth.    Calcium Polycarbophil (FIBER-CAPS PO) Take by mouth daily.    cetirizine (ZYRTEC) 10 MG chewable tablet Chew 10 mg by mouth daily.    Cholecalciferol (VITAMIN D3 PO) Take 4,000 Int'l Units by mouth every morning.    co-enzyme Q-10 30 MG capsule Take 30 mg by mouth 3 (three) times daily.    CREATINE PO Take by mouth.    dicyclomine (BENTYL) 10 MG capsule TAKE 1  CAPSULE (10 MG TOTAL) BY MOUTH 4 TIMES A DAY BEFORE MEALS AND AT BEDTIME    docusate sodium (COLACE) 50 MG capsule Take 50 mg by mouth 2 (two) times daily.    estradiol (VIVELLE-DOT) 0.0375 MG/24HR 1 patch.    famotidine (PEPCID) 10 MG tablet Take 10 mg by mouth 2 (two) times daily.    levonorgestrel  (MIRENA) 20 MCG/24HR IUD 1 each by Intrauterine route once. PLACED 09/ 2015    lidocaine (LIDODERM) 5 % Place 1 patch onto the skin daily. Remove & Discard patch within 12 hours or as directed by MD    magnesium 30 MG tablet Take 30 mg by mouth 2 (two) times daily.    Melatonin 10 MG CHEW Chew by mouth.    Misc Natural Products (FIBER 7 PO) Take by mouth.    Multiple Vitamin (MULTIVITAMIN) tablet Take 1 tablet by mouth daily.    Naltrexone HCl, Pain, 1.5 MG CAPS Take 1.5 mg by mouth at bedtime for 7 days, THEN 3 mg at bedtime for 21 days. You will need to contact your prescribing physician to report effects before further refills.    Omega-3 Fatty Acids (OMEGA 3 500 PO)     ondansetron (ZOFRAN) 4 MG tablet Take 4 mg by mouth every 8 (eight) hours as needed for nausea or vomiting.    ondansetron (ZOFRAN) 4 MG tablet Take 2 tablets (8 mg total) by mouth every 8 (eight) hours as needed for nausea or vomiting.    PREMARIN vaginal cream 0.5 G PER VAGINA NIGHTLY 2-3 TIMES PER WEEK    Probiotic Product (PROBIOTIC DAILY PO) Take 1 capsule by mouth every morning.    rizatriptan (MAXALT) 10 MG tablet Take 10 mg by mouth as needed for migraine. May repeat in 2 hours if needed    Turmeric (QC TUMERIC COMPLEX PO) Take by mouth.    [DISCONTINUED] buPROPion (WELLBUTRIN XL) 300 MG 24 hr tablet Take 300 mg by mouth daily. 05/17/2023: she only take i-tab   [DISCONTINUED] buPROPion (ZYBAN) 150 MG 12 hr tablet Take 150 mg by mouth 2 (two) times daily.    [DISCONTINUED] propranolol (INDERAL) 20 MG tablet Take 20 mg by mouth 3 (three) times daily.    No facility-administered medications prior to visit.    Review of Systems  Constitutional:  Negative for chills, fever, malaise/fatigue and weight loss.  HENT:  Negative for ear pain, sinus pain and sore throat.   Eyes:  Negative for pain.  Respiratory:  Negative for cough, shortness of breath and wheezing.   Cardiovascular:  Negative for chest pain, palpitations  and leg swelling.  Gastrointestinal:  Negative for diarrhea, nausea and vomiting.  Genitourinary:  Negative for dysuria, frequency and urgency.  Musculoskeletal:  Negative for falls, joint pain and neck pain.       Chronic  Skin:  Negative for itching and rash.  Neurological:  Negative for dizziness and headaches.  Endo/Heme/Allergies:  Negative for environmental allergies and polydipsia. Does not bruise/bleed easily.  Psychiatric/Behavioral:  Negative for depression, hallucinations and suicidal ideas. The patient does not have insomnia.    Negative unless indicated in HPI    Objective:     BP 126/75   Pulse (!) 59   Temp 98 F (36.7 C) (Temporal)   Ht 5\' 2"  (1.575 m)   Wt 142 lb 9.6 oz (64.7 kg)   SpO2 99%   BMI 26.08 kg/m  BP Readings from Last 3 Encounters:  05/17/23 126/75  05/07/23 112/80  04/05/23 127/69   Wt Readings from Last 3 Encounters:  05/17/23 142 lb 9.6 oz (64.7 kg)  05/07/23 145 lb (65.8 kg)  04/05/23 147 lb 12.8 oz (67 kg)      Physical Exam Vitals and nursing note reviewed.  Constitutional:      General: She is not in acute distress.    Appearance: Normal appearance.  HENT:     Head: Normocephalic and atraumatic.  Neurological:     Mental Status: She is alert and oriented to person, place, and time. Mental status is at baseline.  Psychiatric:        Mood and Affect: Mood normal.        Behavior: Behavior normal.        Thought Content: Thought content normal.        Judgment: Judgment normal.      No results found for any visits on 05/17/23. Last CBC Lab Results  Component Value Date   WBC 7.5 04/05/2023   HGB 13.4 04/05/2023   HCT 39.3 04/05/2023   MCV 95 04/05/2023   MCH 32.3 04/05/2023   RDW 12.3 04/05/2023   PLT 369 04/05/2023   Last metabolic panel Lab Results  Component Value Date   GLUCOSE 103 (H) 04/05/2023   NA 141 04/05/2023   K 4.9 04/05/2023   CL 105 04/05/2023   CO2 24 04/05/2023   BUN 12 04/05/2023   CREATININE  0.75 04/05/2023   EGFR 100 04/05/2023   CALCIUM 9.7 04/05/2023   PROT 6.5 04/05/2023   ALBUMIN 4.4 04/05/2023   LABGLOB 2.1 04/05/2023   BILITOT 0.6 04/05/2023   ALKPHOS 70 04/05/2023   AST 12 04/05/2023   ALT 10 04/05/2023   ANIONGAP 13 04/03/2021   Last lipids Lab Results  Component Value Date   CHOL 131 04/05/2023   HDL 66 04/05/2023   LDLCALC 53 04/05/2023   TRIG 51 04/05/2023   CHOLHDL 2.0 04/05/2023   Last hemoglobin A1c Lab Results  Component Value Date   HGBA1C 5.4 11/14/2013   Last thyroid functions Lab Results  Component Value Date   TSH 0.729 04/05/2023   T4TOTAL 8.2 04/05/2023    Observations/Objective: No vital signs or physical exam, this was a virtual health encounter.  Pt alert and oriented, answers all questions appropriately, and able to speak in full sentences.    Assessment and Plan: Casey "Boneta Lucks" was seen today for annual exam.  Diagnoses and all orders for this visit:  Constipation, unspecified constipation type -     Cancel: Ambulatory referral to Gastroenterology -     Ambulatory referral to Gastroenterology  Flatulence, eructation and gas pain -     Cancel: Ambulatory referral to Gastroenterology -     Ambulatory referral to Gastroenterology  Subclinical hyperthyroidism -     TSH  Mood disorder (HCC) -     buPROPion (WELLBUTRIN SR) 150 MG 12 hr tablet; Take 1 tablet (150 mg total) by mouth daily. Take with the 300 mg XR in the AM for a total of 450 mg daily -     buPROPion (WELLBUTRIN XL) 300 MG 24 hr tablet; Take 1 tablet (300 mg total) by mouth daily. Take with 150 mg for a total of 450 mg daily  Attention deficit hyperactivity disorder (ADHD), combined type -     buPROPion (WELLBUTRIN SR) 150 MG 12 hr tablet; Take 1 tablet (150 mg total) by mouth daily. Take with the 300 mg XR in the AM for a total of 450  mg daily -     buPROPion (WELLBUTRIN XL) 300 MG 24 hr tablet; Take 1 tablet (300 mg total) by mouth daily. Take with 150 mg  for a total of 450 mg daily  Panic disorder -     propranolol (INDERAL) 20 MG tablet; Take 1 tablet (20 mg total) by mouth 3 (three) times daily.  Fibromyalgia -     tiZANidine (ZANAFLEX) 4 MG capsule; Take 1 capsule (4 mg total) by mouth 3 (three) times daily as needed for muscle spasms.  Chronic pain syndrome -     tiZANidine (ZANAFLEX) 4 MG capsule; Take 1 capsule (4 mg total) by mouth 3 (three) times daily as needed for muscle spasms.  Ehlers-Danlos disease -     Ambulatory referral to Gastroenterology -     Ambulatory referral to Cardiology   Casey Cox is a 47 year old Caucasian female seen today for annual physical, no acute distress  Depression: Wellbutrin refill client is referred to psychiatry  EDS: Propranolol 20 mg twice daily refill and client is referred to cardiology  Subclinical hyperthyroidism: Repeat TSH today  EDS/fibromyalgia/osteoarthritis: Continue naltrexone prescribed by pain management: Zanaflex 4 mg as needed 3 times daily refill provided client to consult with pain management for refills  Chronic Constipation/EDS: Zofran refill hide is referred to GI for Labs: Tsh result pending Encourage healthy lifestyle choices, including diet (rich in fruits, vegetables, and lean proteins, and low in salt and simple carbohydrates) and exercise (at least 30 minutes of moderate physical activity daily).     The above assessment and management plan was discussed with the patient. The patient verbalized understanding of and has agreed to the management plan. Patient is aware to call the clinic if they develop any new symptoms or if symptoms persist or worsen. Patient is aware when to return to the clinic for a follow-up visit. Patient educated on when it is appropriate to go to the emergency department.  Follow Up Instructions: Return in about 6 months (around 11/15/2023).    I discussed the assessment and treatment plan with the patient. The patient was provided an  opportunity to ask questions and all were answered. The patient agreed with the plan and demonstrated an understanding of the instructions.   The patient was advised to call back or seek an in-person evaluation if the symptoms worsen or if the condition fails to improve as anticipated.  The above assessment and management plan was discussed with the patient. The patient verbalized understanding of and has agreed to the management plan. Patient is aware to call the clinic if they develop any new symptoms or if symptoms persist or worsen. Patient is aware when to return to the clinic for a follow-up visit. Patient educated on when it is appropriate to go to the emergency department.    I provided 20 minutes of time during this video encounter.   Arrie Aran Santa Lighter, DNP [facility] [address] [place], [place] [address] [phone] 05/17/2023

## 2023-05-18 LAB — TSH: TSH: 0.78 u[IU]/mL (ref 0.450–4.500)

## 2023-05-28 ENCOUNTER — Other Ambulatory Visit: Payer: Self-pay | Admitting: Nurse Practitioner

## 2023-06-01 ENCOUNTER — Telehealth: Payer: Self-pay | Admitting: Gastroenterology

## 2023-06-01 ENCOUNTER — Encounter: Payer: Self-pay | Admitting: Neurology

## 2023-06-01 DIAGNOSIS — H5213 Myopia, bilateral: Secondary | ICD-10-CM | POA: Diagnosis not present

## 2023-06-01 NOTE — Telephone Encounter (Signed)
 Good afternoon Dr. Stacia Supervising MD PM  The following patient is being referred to us  for constipation, flatulence and Ehlers-Danlos disease. She has prior history at [facility] and since moving to [facility] she wants to have all care with [facility]. Records are available with care everywhere. Please review and advised of scheduling. Thank you.

## 2023-06-05 ENCOUNTER — Ambulatory Visit (HOSPITAL_BASED_OUTPATIENT_CLINIC_OR_DEPARTMENT_OTHER): Payer: Medicaid Other | Admitting: Physical Therapy

## 2023-06-13 ENCOUNTER — Other Ambulatory Visit: Payer: Self-pay | Admitting: Nurse Practitioner

## 2023-06-13 DIAGNOSIS — M797 Fibromyalgia: Secondary | ICD-10-CM

## 2023-06-13 DIAGNOSIS — G894 Chronic pain syndrome: Secondary | ICD-10-CM

## 2023-06-13 MED ORDER — TIZANIDINE HCL 4 MG PO CAPS: 4.0000 mg | ORAL_CAPSULE | Freq: Three times a day (TID) | ORAL | 0 refills | Status: DC | PRN

## 2023-06-18 NOTE — Telephone Encounter (Signed)
Left VM for PT to call and schedule OV with [facility] or [facility].

## 2023-06-19 NOTE — Progress Notes (Unsigned)
NEUROLOGY CONSULTATION NOTE  Casey Cox MRN: [id number] DOB: 09-01-1975  Referring provider: Martina Sinner, NP Primary care provider: Arrie Aran Santa Lighter, NP  Reason for consult:  headaches  Assessment/Plan:   Chronic migraine with aura, without status migrainosus, not intractable Chronic cervicogenic headaches Clear rhinorrhea - concern for CSF leak Ehlers-Danlos Syndrome Craniocervical instability Dysautonomia   For evaluation of secondary intracranial abnormality and source of craniofacial CSF leak, check MRI of brain with and without contrast and CISS. Migraine prevention:  Plan is to start Emgality.  I raised concern of constipation in setting of gastroparesis.  She thinks she will likely be able to manage symptoms and would like to proceed.  Otherwise, consider Botox, although would have to be cautious in setting of connective tissue disease.   Migraine rescue:  She will try samples of Ubrelvy 100mg  Limit use of pain relievers to no more than 2 days out of week to prevent risk of rebound or medication-overuse headache. Keep headache diary Consider supplements/vitamins:  Magnesium citrate, CoQ10, riboflavin Follow up in 6 months.   Total time spent in chart and face to face with patient:  80 minutes.   Subjective:  Casey Cox is a 48 year old female with Ehlers-Danlos syndrome, dysautonomia, TMJ dysfunction s/p arthrotomy, fibromyalgia, IBS, gastroparesis, PCOS, PTSD, ADHD, OCD, depression and anxiety who presents for headaches.  History supplemented by prior neurologist's and referring provider's notes.  She is accompanied by her spouse who supplements history.  She began experiencing chronic migraines in 2000, which brought her to the ED where she underwent an LP that led to a post-LP headache requiring a blood patch. She has had constant headaches since then.  She always has a dull persistent headache.  Migraines are described as 7-9/10  left frontal or right sided throbbing/pressure headache.  Preceded by visual aura of either sparkles in her vision or dysmetropsia or with trouble getting words out.  This may last 5 to 20 minutes followed by the headache.  Headaches are associated with bilateral neck pain into the shoulders, nausea, photophobia, phonophobia, osmophobia and pulsatile tinnitus.  Typically lasts 6 hours but ranges from 1-2 hours up to several days.  She averages at least 15 migraine days a month.  As she has become perimenopausal, there is greater association with her menstrual cycle.  Headaches are not positional.  Cool temperature and dark and quiet room help alleviate symptoms.  She has Ehlers-Danlos syndrome with craniocervical instability which causes chronic neck pain and cervicogenic headaches.  It is a constant 6-7/10 pressure pain in the back of her head radiating down the back of her neck and into the shoulders.  Headaches are not positional.  MRI of cervical spine without contrast on 08/29/2017 revealed:  Degenerative changes of the cervical spine are most focal at C6-C7 secondary to a small posterior disc osteophyte complex and bilateral facet and uncovertebral joint hypertrophy resulting in moderate right and mild left foraminal stenosis.  She was previously followed by specialists, including neurology, at [facility].  She was evaluated for numbness and tingling in the hands as well as chronic pain.   NCV-EMG of upper extremities on 02/15/2018 was normal.  X-ray of lumbar spine from 12/25/2018 showed mild facet osteoarthritis at L4-L5 and L5-S1 bilaterally.  She also has history of dysautonomia presenting as positional lightheadedness and tachycardia.  Tilt table test on 04/17/2018 was negative.   Past NSAIDS/analgesics:  none Past abortive triptans:  sumatriptan tab Past abortive  ergotamine:  none Past muscle relaxants:  Flexeril Past anti-emetic:  promethazine Past antihypertensive medications:   atenolol Past antidepressant medications:  amitriptyline, nortriptyline, duloxetine Past anticonvulsant medications:  none Past anti-CGRP:  none Past vitamins/Herbal/Supplements:  none Past antihistamines/decongestants:  none Other past therapies:  dry needling, TENS unit, acupuncture, massage, chiropractic  Current NSAIDS/analgesics:  Tylenol, Excedrin, lidocaine 5% patch Current triptans:  rizatriptan 10mg  Current ergotamine:  none Current anti-emetic:  Zofran 4mg  Current muscle relaxants:  tizanidine 4mg  TID PRN Current Antihypertensive medications:  propranolol 20mg  TID (anxiety) Current Antidepressant medications:  Wellbutrin 450mg  daily Current Anticonvulsant medications:  none Current anti-CGRP:  none Current Vitamins/Herbal/Supplements:  CoQ10 30mg  TID, turmeric, Ca, D3 Current Antihistamines/Decongestants:  Zyrtec Other therapy:  none Birth control:  Mirena Other medications:  alprazolam, naltrexone   Caffeine:  no coffee, soda, black tea Alcohol:  no Smoker:  no Diet:  Drinks a lot of water with electrolytes (64 oz daily).  Green tea.  Tries not to skip meals.  Protein drink if has gastroparesis flare and cannot eat.   Exercise:  unable due to joint pain/hypermobility and dysautonomia.  Tries to walk.  Tries to lift weights. Depression:  yes; Anxiety:  yes.   Sleep hygiene:  poor.  Mostly staying asleep Family history of headache:  mom (migraines), sister, brother  Family history of autoimmune disease:  nothing definitively diagnosed.        PAST MEDICAL HISTORY: Past Medical History:  Diagnosis Date   Acne    sees dermatologist   Anxiety and depression    Ehlers-Danlos syndrome    per pt dx with her rheuamtologist  (joint hypermobility, connective tissue disorder)   Endometriosis    Fibromyalgia    History of concussion    2009-- no residual   History of melasma    skin depigmentation   Interstitial cystitis    Migraine    Osteoarthritis    jaw, left  ankle, knees   Panic disorder    hx pain attacks   PCOS (polycystic ovarian syndrome)    Pelvic pain in female    Seasonal allergies    Subclinical hyperthyroidism    sees endocrinologist--- DR Casey Cox   TMJ syndrome     PAST SURGICAL HISTORY: Past Surgical History:  Procedure Laterality Date   ANKLE RECONSTRUCTION Left 04/08/2014   Procedure: RECONSTRUCTION LEFT ANKLE, REPAIR SECONDARY DISRUPTED LIGAMENT ANKLE COLLATERAL, TRANSPLANTERIOR/TRANSFER TENDON TIBIAL EXTENSOR INTO MIDFOOT DEEP;  Surgeon: Thera Flake., MD;  Location: [facility];  Service: Orthopedics;  Laterality: Left;   ARTHROTOMY Bilateral 12/23/2013   Procedure: BILATERAL ARTHROTOMY, MENISECTOMY, TEMPORALIS FASCIA GRAFT;  Surgeon: Georgia Lopes, DDS;  Location: [facility];  Service: Oral Surgery;  Laterality: Bilateral;   TMJ   LAPAROSCOPY N/A 06/24/2013   Procedure: LAPAROSCOPY OPERATIVE WITH CHROMOPERTUBATION, LYSIS OF ADHESIONS AND APPENDECTOMY ;  Surgeon: Mitchel Honour, DO;  Location: [facility];  Service: Gynecology;  Laterality: N/A;   LAPAROSCOPY N/A 08/07/2016   Procedure: LAPAROSCOPY OPERATIVE, FULGERATION  OF ENDOMETRIOSIS ;  Surgeon: Mitchel Honour, DO;  Location: [facility];  Service: Gynecology;  Laterality: N/A;   LIGAMENT REPAIR Right 01/13/2014   Procedure: RIGHT ANKLE REPAIR SECONDAY DISRUPTED LIGAMENT ANKLE  COLLATERAL WITH PERONEAL TENDON AUTOGRAFT;  Surgeon: Thera Flake., MD;  Location: [facility];  Service: Orthopedics;  Laterality: Right;   TMJ ARTHROCENTESIS Bilateral 07-04-2012   w/  IV Sedation in office   WISDOM TOOTH EXTRACTION  2004    MEDICATIONS:  Current Outpatient Medications on File Prior to Visit  Medication Sig Dispense Refill   Acetylcysteine (NAC PO) Take by mouth.     ALPRAZolam (XANAX) 0.5 MG tablet Take 0.5 mg by mouth 2 (two) times daily as needed for sleep.      b complex vitamins capsule Take 1 capsule by mouth daily.     Barberry-Oreg  Grape-Goldenseal (BERBERINE COMPLEX PO) Take by mouth.     buPROPion (WELLBUTRIN SR) 150 MG 12 hr tablet Take 1 tablet (150 mg total) by mouth daily. Take with the 300 mg XR in the AM for a total of 450 mg daily 90 tablet 0   buPROPion (WELLBUTRIN XL) 300 MG 24 hr tablet Take 1 tablet (300 mg total) by mouth daily. Take with 150 mg for a total of 450 mg daily 90 tablet 0   Calcium Polycarbophil (FIBER-CAPS PO) Take by mouth daily.     cetirizine (ZYRTEC) 10 MG chewable tablet Chew 10 mg by mouth daily.     Cholecalciferol (VITAMIN D3 PO) Take 4,000 Int'l Units by mouth every morning.     co-enzyme Q-10 30 MG capsule Take 30 mg by mouth 3 (three) times daily.     CREATINE PO Take by mouth.     dicyclomine (BENTYL) 10 MG capsule TAKE 1 CAPSULE (10 MG TOTAL) BY MOUTH 4 TIMES A DAY BEFORE MEALS AND AT BEDTIME 90 capsule 1   docusate sodium (COLACE) 50 MG capsule Take 50 mg by mouth 2 (two) times daily.     estradiol (VIVELLE-DOT) 0.0375 MG/24HR 1 patch.     famotidine (PEPCID) 10 MG tablet Take 10 mg by mouth 2 (two) times daily.     levonorgestrel (MIRENA) 20 MCG/24HR IUD 1 each by Intrauterine route once. PLACED 09/ 2015     lidocaine (LIDODERM) 5 % Place 1 patch onto the skin daily. Remove & Discard patch within 12 hours or as directed by MD 60 patch 2   magnesium 30 MG tablet Take 30 mg by mouth 2 (two) times daily.     Melatonin 10 MG CHEW Chew by mouth.     Misc Natural Products (FIBER 7 PO) Take by mouth.     Multiple Vitamin (MULTIVITAMIN) tablet Take 1 tablet by mouth daily.     Omega-3 Fatty Acids (OMEGA 3 500 PO)      ondansetron (ZOFRAN) 4 MG tablet Take 1 tablet (4 mg total) by mouth every 8 (eight) hours as needed for nausea or vomiting. 30 tablet 0   PREMARIN vaginal cream 0.5 G PER VAGINA NIGHTLY 2-3 TIMES PER WEEK     Probiotic Product (PROBIOTIC DAILY PO) Take 1 capsule by mouth every morning.     propranolol (INDERAL) 20 MG tablet Take 1 tablet (20 mg total) by mouth 3 (three)  times daily. 90 tablet 2   rizatriptan (MAXALT) 10 MG tablet Take 10 mg by mouth as needed for migraine. May repeat in 2 hours if needed     tiZANidine (ZANAFLEX) 4 MG capsule Take 1 capsule (4 mg total) by mouth 3 (three) times daily as needed for muscle spasms. 90 capsule 0   Turmeric (QC TUMERIC COMPLEX PO) Take by mouth.     No current facility-administered medications on file prior to visit.    ALLERGIES: Allergies  Allergen Reactions   Amoxicillin Rash   Nickel Rash    FAMILY HISTORY: Family History  Problem Relation Age of Onset   Fibromyalgia Mother    Arthritis Mother  Cancer Mother        SKIN CA   Diabetes Father    Arthritis Father    Mental illness Maternal Grandmother    Cancer Maternal Grandfather        PROSTATE CA   Diabetes Maternal Grandfather    Diabetes Paternal Grandmother    Hyperlipidemia Paternal Grandmother    Hypertension Paternal Grandmother    Hypertension Paternal Grandfather    Arthritis Paternal Grandfather    Heart disease Paternal Grandfather     Objective:  Blood pressure 125/73, pulse 84, height 5\' 2"  (1.575 m), weight 146 lb (66.2 kg), last menstrual period 05/31/2023, SpO2 97%. General: No acute distress.  Patient appears well-groomed.   Head:  Normocephalic/atraumatic Eyes:  fundi examined but not visualized Neck: supple, no paraspinal tenderness, full range of motion Back: No paraspinal tenderness Heart: regular rate and rhythm Lungs: Clear to auscultation bilaterally. Vascular: No carotid bruits. Neurological Exam: Mental status: alert and oriented to person, place, and time, speech fluent and not dysarthric, language intact. Cranial nerves: CN I: not tested CN II: pupils equal, round and reactive to light, visual fields intact CN III, IV, VI:  full range of motion, no nystagmus, no ptosis CN V: facial sensation intact. CN VII: upper and lower face symmetric CN VIII: hearing intact CN IX, X: gag intact, uvula  midline CN XI: sternocleidomastoid and trapezius muscles intact CN XII: tongue midline Bulk & Tone: normal, no fasciculations. Motor:  muscle strength 5/5 throughout Sensation:  Pinprick, temperature and vibratory sensation intact. Deep Tendon Reflexes:  2+ throughout,  toes downgoing.   Finger to nose testing:  Without dysmetria.   Heel to shin:  Without dysmetria.   Gait:  Normal station and stride.  Romberg negative.    Thank you for allowing me to take part in the care of this patient.  Shon Millet, DO  CC: Casey Aran Santa Lighter, NP

## 2023-06-20 ENCOUNTER — Other Ambulatory Visit (HOSPITAL_COMMUNITY): Payer: Self-pay

## 2023-06-20 ENCOUNTER — Other Ambulatory Visit: Payer: Self-pay

## 2023-06-20 ENCOUNTER — Telehealth: Payer: Self-pay | Admitting: Pharmacy Technician

## 2023-06-20 ENCOUNTER — Ambulatory Visit: Payer: Medicaid Other | Admitting: Neurology

## 2023-06-20 ENCOUNTER — Encounter: Payer: Self-pay | Admitting: Neurology

## 2023-06-20 VITALS — BP 125/73 | HR 84 | Ht 62.0 in | Wt 146.0 lb

## 2023-06-20 DIAGNOSIS — G901 Familial dysautonomia [Riley-Day]: Secondary | ICD-10-CM

## 2023-06-20 DIAGNOSIS — G4486 Cervicogenic headache: Secondary | ICD-10-CM

## 2023-06-20 DIAGNOSIS — M542 Cervicalgia: Secondary | ICD-10-CM

## 2023-06-20 DIAGNOSIS — M532X2 Spinal instabilities, cervical region: Secondary | ICD-10-CM | POA: Diagnosis not present

## 2023-06-20 DIAGNOSIS — G43E09 Chronic migraine with aura, not intractable, without status migrainosus: Secondary | ICD-10-CM

## 2023-06-20 DIAGNOSIS — Q796 Ehlers-Danlos syndrome, unspecified: Secondary | ICD-10-CM

## 2023-06-20 DIAGNOSIS — J3489 Other specified disorders of nose and nasal sinuses: Secondary | ICD-10-CM

## 2023-06-20 MED ORDER — EMGALITY 120 MG/ML ~~LOC~~ SOAJ: 240.0000 mg | Freq: Once | SUBCUTANEOUS | 0 refills | Status: AC

## 2023-06-20 MED ORDER — UBRELVY 100 MG PO TABS: ORAL_TABLET | ORAL | Status: DC

## 2023-06-20 NOTE — Telephone Encounter (Signed)
Pharmacy Patient Advocate Encounter   Received notification from Physician's Office that prior authorization for [facility] 120MG  is required/requested.   Insurance verification completed.   The patient is insured through [facility] MEDICAID .   Per test claim: PA required; PA submitted to above mentioned insurance via CoverMyMeds Key/confirmation #/EOC [facility] Status is pending

## 2023-06-20 NOTE — Patient Instructions (Addendum)
  Check MRI of brain with and without contrast With CISS  A referral to [facility] has been placed for your MRI someone will contact you directly to schedule your appt. They are located at [address] [facility]. Please contact them directly by calling [phone] with any questions regarding your referral.  Plan to start Emgality - first dose requires 2 injections (2 pens), then 1 pen every 28 days thereafter.  Make sure you have 2 pens for first dose.  When you pick it up, send me a MyChart message and I will send in the standing order Take Ubrelvy 100mg  at earliest onset of headache.  May repeat dose once in 2 hours if needed.  Maximum 2 tablets in 24 hours.  Let me know if you would like a prescription. Limit use of pain relievers to no more than 10 days out of the month.  These medications include acetaminophen, NSAIDs (ibuprofen/Advil/Motrin, naproxen/Aleve, triptans (Imitrex/sumatriptan), Excedrin, and narcotics.  This will help reduce risk of rebound headaches. Be aware of common food triggers Routine exercise Stay adequately hydrated (aim for 64 oz water daily) Keep headache diary Maintain proper stress management Maintain proper sleep hygiene Do not skip meals Consider supplements:  magnesium citrate 400mg  daily, riboflavin 400mg  daily, coenzyme Q10 300mg  daily

## 2023-06-21 NOTE — Telephone Encounter (Signed)
Pharmacy Patient Advocate Encounter  Received notification from [facility] MEDICAID that Prior Authorization for [facility] 120MG  has been DENIED.  Full denial letter will be uploaded to the media tab. See denial reason below.   PA #/Case ID/Reference #: [id number]

## 2023-06-26 ENCOUNTER — Other Ambulatory Visit: Payer: Self-pay

## 2023-06-26 DIAGNOSIS — M797 Fibromyalgia: Secondary | ICD-10-CM

## 2023-06-26 DIAGNOSIS — G894 Chronic pain syndrome: Secondary | ICD-10-CM

## 2023-06-26 MED ORDER — TIZANIDINE HCL 4 MG PO TABS: 4.0000 mg | ORAL_TABLET | Freq: Three times a day (TID) | ORAL | 0 refills | Status: DC | PRN

## 2023-07-03 ENCOUNTER — Encounter (HOSPITAL_BASED_OUTPATIENT_CLINIC_OR_DEPARTMENT_OTHER): Payer: Self-pay | Admitting: Physical Therapy

## 2023-07-03 ENCOUNTER — Ambulatory Visit (HOSPITAL_BASED_OUTPATIENT_CLINIC_OR_DEPARTMENT_OTHER): Payer: Medicaid Other | Attending: Physical Medicine and Rehabilitation | Admitting: Physical Therapy

## 2023-07-03 DIAGNOSIS — M797 Fibromyalgia: Secondary | ICD-10-CM | POA: Diagnosis present

## 2023-07-03 DIAGNOSIS — G8929 Other chronic pain: Secondary | ICD-10-CM | POA: Insufficient documentation

## 2023-07-03 DIAGNOSIS — M5459 Other low back pain: Secondary | ICD-10-CM | POA: Diagnosis present

## 2023-07-03 DIAGNOSIS — M546 Pain in thoracic spine: Secondary | ICD-10-CM | POA: Diagnosis present

## 2023-07-03 DIAGNOSIS — Q796 Ehlers-Danlos syndrome, unspecified: Secondary | ICD-10-CM | POA: Insufficient documentation

## 2023-07-03 DIAGNOSIS — M542 Cervicalgia: Secondary | ICD-10-CM | POA: Insufficient documentation

## 2023-07-03 DIAGNOSIS — R293 Abnormal posture: Secondary | ICD-10-CM | POA: Insufficient documentation

## 2023-07-03 DIAGNOSIS — M533 Sacrococcygeal disorders, not elsewhere classified: Secondary | ICD-10-CM | POA: Insufficient documentation

## 2023-07-03 DIAGNOSIS — R2689 Other abnormalities of gait and mobility: Secondary | ICD-10-CM | POA: Insufficient documentation

## 2023-07-03 NOTE — Therapy (Signed)
 OUTPATIENT PHYSICAL THERAPY THORACOLUMBAR EVALUATION   Patient Name: Casey Cox MRN: [id number] DOB:[birth year], 48 y.o., female Today's Date: 07/03/2023  END OF SESSION:  PT End of Session - 07/03/23 0811     Visit Number 1    Number of Visits 16    Date for PT Re-Evaluation 08/28/23    Authorization Type managed medicaid    PT Start Time 0800    PT Stop Time 0843    PT Time Calculation (min) 43 min             Past Medical History:  Diagnosis Date   Acne    sees dermatologist   Anxiety and depression    Ehlers-Danlos syndrome    per pt dx with her rheuamtologist  (joint hypermobility, connective tissue disorder)   Endometriosis    Fibromyalgia    History of concussion    2009-- no residual   History of melasma    skin depigmentation   Interstitial cystitis    Migraine    Osteoarthritis    jaw, left ankle, knees   Panic disorder    hx pain attacks   PCOS (polycystic ovarian syndrome)    Pelvic pain in female    Seasonal allergies    Subclinical hyperthyroidism    sees endocrinologist--- DR TRIXIE   TMJ syndrome    Past Surgical History:  Procedure Laterality Date   ANKLE RECONSTRUCTION Left 04/08/2014   Procedure: RECONSTRUCTION LEFT ANKLE, REPAIR SECONDARY DISRUPTED LIGAMENT ANKLE COLLATERAL, TRANSPLANTERIOR/TRANSFER TENDON TIBIAL EXTENSOR INTO MIDFOOT DEEP;  Surgeon: LELON JONETTA Shari Mickey., MD;  Location: [facility];  Service: Orthopedics;  Laterality: Left;   ARTHROTOMY Bilateral 12/23/2013   Procedure: BILATERAL ARTHROTOMY, MENISECTOMY, TEMPORALIS FASCIA GRAFT;  Surgeon: Glendia CHRISTELLA Primrose, DDS;  [facility]: [facility];  Service: Oral Surgery;  Laterality: Bilateral;   TMJ   LAPAROSCOPY N/A 06/24/2013   Procedure: LAPAROSCOPY OPERATIVE WITH CHROMOPERTUBATION, LYSIS OF ADHESIONS AND APPENDECTOMY ;  Surgeon: Duwaine Blumenthal, DO;  Location: [facility];  Service: Gynecology;  Laterality: N/A;   LAPAROSCOPY N/A 08/07/2016   Procedure: LAPAROSCOPY OPERATIVE,  FULGERATION  OF ENDOMETRIOSIS ;  Surgeon: Duwaine Blumenthal, DO;  Location: [facility];  Service: Gynecology;  Laterality: N/A;   LIGAMENT REPAIR Right 01/13/2014   Procedure: RIGHT ANKLE REPAIR SECONDAY DISRUPTED LIGAMENT ANKLE  COLLATERAL WITH PERONEAL TENDON AUTOGRAFT;  Surgeon: LELON JONETTA Shari Mickey., MD;  Location: [facility];  Service: Orthopedics;  Laterality: Right;   TMJ ARTHROCENTESIS Bilateral 07-04-2012   w/  IV Sedation in office   WISDOM TOOTH EXTRACTION  2004   Patient Active Problem List   Diagnosis Date Noted   Chronic pain syndrome 05/07/2023   Insomnia 05/07/2023   Chronic SI joint pain 05/07/2023   ADHD 04/05/2023   Constipation 04/05/2023   Flatulence, eructation and gas pain 04/05/2023   Anxiety 04/16/2018   Panic disorder 04/16/2018   BMI 32.0-32.9,adult 10/01/2014   Anxiety, Depression, Insomnia, Panic disorder 10/01/2014   Ehlers-Danlos disease 05/19/2014   Subclinical hyperthyroidism 02/03/2014   Acne 11/14/2013   Low serum progesterone 11/14/2013   Interstitial cystitis 02/26/2013   Fibromyalgia 04/04/2012   TMJ (temporomandibular joint syndrome) 04/04/2012   Migraines 04/04/2012   Dysautonomia (HCC) 05/29/1990    PCP: Nena Deitra Saltness NP  REFERRING PROVIDER: Joesph Likes DO   REFERRING DIAG:  Diagnosis  M79.7 (ICD-10-CM) - Fibromyalgia  Q79.60 (ICD-10-CM) - Ehlers-Danlos disease  M53.3,G89.29 (ICD-10-CM) - Chronic SI joint pain    Rationale for Evaluation  and Treatment: Rehabilitation  THERAPY DIAG:  No diagnosis found.  ONSET DATE:  Long term   SUBJECTIVE:                                                                                                                                                                                           SUBJECTIVE STATEMENT: Patient has a long history of Ehlers-Danlos syndrome and hypermobility.  She has multijoint pain including her cervical spine, thoracic spine, SI joint, and she  has had multiple ankle surgeries.  She also has frequent migraines.  She feels like over time she is progressively losing muscle.  She has exercise that she performs at home but feels like she needs some guidance into proper exercises.  She is immunocompromise.  She currently wears a mask and requires a mask to be worn when she is around people.  She goes to aquatic therapy if she is able to wear a mask in the pool.  PERTINENT HISTORY:  Anxiety/depression, Ehlers-Danlos syndrome, fibromyalgia, migraines, OA of the jaw, left ankle, bilateral knees, past history of panic attacks, polys cystic ovarian syndrome, TMJ, bilateral TMJ arthrotomy 2015, left ankle reconstruction 2015.  PAIN:  Are you having pain? Yes: NPRS scale: 8 out of 10 Pain location: SI joint Pain description: Aching Aggravating factors: Driving, walking Relieving factors: Pain comes and goes  Yes: NPRS scale: 8 out of 10 Pain location: Cervical/thoracic spine Pain description: Aching/burning Aggravating factors: Driving Relieving factors: Rest    PRECAUTIONS: None  RED FLAGS: None   WEIGHT BEARING RESTRICTIONS: No  FALLS:  Has patient fallen in last 6 months? Yes. 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  PLOF: Independent  PATIENT GOALS:  To make sure she strengthens the muscles she can. To maintain strength   NEXT MD VISIT:    OBJECTIVE:  Note: Objective measures were completed at Evaluation unless otherwise noted.  DIAGNOSTIC FINDINGS:    PATIENT SURVEYS:  Oswestry 25/50   COGNITION: Overall cognitive status: Within functional limits for tasks assessed     SENSATION: Toes and hands often go numb  MUSCLE LENGTH: General hypermobility POSTURE: No Significant postural limitations  PALPATION: Significant tenderness palpation upper traps, periscapular muscles, bilateral lumbar paraspinals, bilateral gluteals.  LUMBAR ROM:    AROM eval  Flexion Hypermobile  Extension Hypermobile  Right lateral flexion Pain on the left  Left lateral flexion Pain on the right  Right rotation Hypermobile  Left rotation Hypermobile   (Blank rows = not tested)  LOWER EXTREMITY ROM:     Passive  Right eval Left eval  Hip flexion Hyper mobile Hypermobile  Hip extension    Hip abduction    Hip adduction    Hip internal rotation Hypermobile Hypermobile  Hip external rotation Hypermobile Hypermobile  Knee flexion    Knee extension    Ankle dorsiflexion    Ankle plantarflexion    Ankle inversion    Ankle eversion     (Blank rows = not tested)  LOWER EXTREMITY MMT:    MMT Right eval Left eval  Hip flexion 24.8 24.6  Hip extension    Hip abduction 32.8 28.7  Hip adduction    Hip internal rotation    Hip external rotation    Knee flexion    Knee extension 44.6 31.6  Ankle dorsiflexion    Ankle plantarflexion    Ankle inversion    Ankle eversion     (Blank rows = not tested)   GAIT: No significant gait abnormalities  TREATMENT DATE:  Self-care: Reviewed use of RPE scale exercises at home.                                                                                                                                Exercises - Theracane Over Shoulder  - 1 x daily - 7 x weekly - 3 sets - 10 reps - Shoulder External Rotation and Scapular Retraction with Resistance  - 1 x daily - 7 x weekly - 3 sets - 10 reps - Low Horizontal Abduction with Resistance  - 1 x daily - 7 x weekly - 3 sets - 10 reps   PATIENT EDUCATION:  Education details: HEP, symptom management, Person educated: Patient Education method: Explanation, Demonstration, Tactile cues, Verbal cues, and Handouts Education comprehension: verbalized understanding, returned demonstration, verbal cues required, tactile cues required, and needs further education  HOME EXERCISE PROGRAM: Access Code: [email or web address] URL:  [email or web address] Date: 07/03/2023 Prepared by: Alm Don  Exercises - Theracane Over Shoulder  - 1 x daily - 7 x weekly - 3 sets - 10 reps - Shoulder External Rotation and Scapular Retraction with Resistance  - 1 x daily - 7 x weekly - 3 sets - 10 reps - Low Horizontal Abduction with Resistance  - 1 x daily - 7 x weekly - 3 sets - 10 reps  ASSESSMENT:  CLINICAL IMPRESSION: Patient is a 48 year old female who presents to physical therapy with Ehlers-Danlos syndrome as well as chronic pain and fibromyalgia.  Her pain can fluctuate in pain levels and locations depending on her activity.  She is currently having significant pain in her cervical/thoracic spine area in her SI joint.  She reports this frequently happens when she drives.  She has significant trigger points up and down her lumbar and thoracic spine.  She also has significant trigger points in her upper traps.  We reviewed the use of Thera cane to reduce sensitivity to trigger points.  She has an exercise program she is doing  at home.  Therapy will review this exercise program.  We will work on grading her exercises properly depending on the exercise.  She may also benefit from pool therapy.  She currently has to wear a mask as she is immunocompromise.  Will see if possible for her to get the pool with the mask on.  Patient will report a  OBJECTIVE IMPAIRMENTS: Abnormal gait, decreased activity tolerance, decreased balance, difficulty walking, decreased ROM, decreased strength, decreased safety awareness, impaired tone, improper body mechanics, pain, and Hyper mobility  .   ACTIVITY LIMITATIONS: carrying, lifting, bending, sitting, standing, squatting, sleeping, stairs, transfers, bed mobility, reach over head, and locomotion level  PARTICIPATION LIMITATIONS: meal prep, cleaning, laundry, driving, shopping, community activity, occupation, and yard work  PERSONAL FACTORS: 3+ comorbidities: Migraines, anxiety,   bilateral knee OA  are also affecting patient's functional outcome.   REHAB POTENTIAL: Good  CLINICAL DECISION MAKING: Unstable/unpredictable multiple joint involement that chenges depedning on the activity and fluctuates in severity    EVALUATION COMPLEXITY: High   GOALS: Goals reviewed with patient? Yes  SHORT TERM GOALS: Target date: 07/31/2023    Patient will report a 25% reduction in tenderness to palpation in cervical and thoracic spine Baseline: Goal status: INITIAL  2.  Patient will increase gross bilateral lower extremity strength by 5 pounds Baseline:  Goal status: INITIAL  3.  Patient will increase gross bilateral upper extremity strength by 5 pounds Baseline:  Goal status: INITIAL  4.  Patient will have basic HEP with an understanding of how to grade exercises using RPE Baseline:  Goal status: INITIAL   LONG TERM GOALS: Target date: 08/28/2023    Patient will ambulate community distances without significant increase in pain. Baseline:  Goal status: INITIAL  2.  Patient will drive for greater than 30 minutes without increased neck and SI pain. Baseline:  Goal status: INITIAL  3.  Patient will have complete program including pool exercises and land exercises to work on at home. Baseline:  Goal status: INITIAL  4.  Patient will report a overall 50% reduction in pain while performing her ADLs Baseline:  Goal status: INITIAL   PLAN:  PT FREQUENCY: 2x/week  PT DURATION: 8 weeks  PLANNED INTERVENTIONS 97110-Therapeutic exercises, 97530- Therapeutic activity, [id number]- Neuromuscular re-education, 97535- Self Care, 02859- Manual therapy, [id number]- Gait training, [phone]- Aquatic Therapy, 97014- Electrical stimulation (unattended), 97035- Ultrasound, Patient/Family education, Stair training, Taping, Dry Needling, DME instructions, Cryotherapy, and Moist heat   PLAN FOR NEXT SESSION:  Patient given 2 upper body/postural exercises to work on this visit.  We  discussed grading the exercises using RPE.  Next visit we will consider giving 2-3 exercises for SI joint.  We also continue to review soft tissue mobilization.  Begin with a low grade high reps exercises.  Keep exercises in series.  Review current HEP.   Alm JINNY Don, PT 07/03/2023, 2:17 PM   For all possible CPT codes, reference the Planned Interventions line above.     Check all conditions that are expected to impact treatment: {Conditions expected to impact treatment:Musculoskeletal disorders   If treatment provided at initial evaluation, no treatment charged due to lack of authorization.

## 2023-07-06 ENCOUNTER — Encounter (HOSPITAL_BASED_OUTPATIENT_CLINIC_OR_DEPARTMENT_OTHER): Payer: Self-pay | Admitting: Physical Therapy

## 2023-07-10 ENCOUNTER — Ambulatory Visit: Payer: Medicaid Other | Admitting: Physician Assistant

## 2023-07-21 ENCOUNTER — Other Ambulatory Visit: Payer: Self-pay | Admitting: Nurse Practitioner

## 2023-07-27 NOTE — Progress Notes (Deleted)
 Subjective:    Patient ID: Casey Cox, female    DOB: 1976/04/26, 48 y.o.   MRN: [id number]  HPI   Pain Inventory Average Pain {NUMBERS; 0-10:5044} Pain Right Now {NUMBERS; 0-10:5044} My pain is {PAIN DESCRIPTION:21022940}  In the last 24 hours, has pain interfered with the following? General activity {NUMBERS; 0-10:5044} Relation with others {NUMBERS; 0-10:5044} Enjoyment of life {NUMBERS; 0-10:5044} What TIME of day is your pain at its worst? {time of day:24191} Sleep (in general) {BHH GOOD/FAIR/POOR:22877}  Pain is worse with: {ACTIVITIES:21022942} Pain improves with: {PAIN IMPROVES WUJW:11914782} Relief from Meds: {NUMBERS; 0-10:5044}  Family History  Problem Relation Age of Onset   Neuropathy Mother    Migraines Mother    Fibromyalgia Mother    Arthritis Mother    Cancer Mother        SKIN CA   Diabetes Father    Arthritis Father    Migraines Sister    Migraines Brother    Mental illness Maternal Grandmother    Cancer Maternal Grandfather        PROSTATE CA   Diabetes Maternal Grandfather    Dementia Paternal Grandmother    Diabetes Paternal Grandmother    Hyperlipidemia Paternal Grandmother    Hypertension Paternal Grandmother    Hypertension Paternal Grandfather    Arthritis Paternal Grandfather    Heart disease Paternal Grandfather    Social History   Socioeconomic History   Marital status: Married    Spouse name: Not on file   Number of children: Not on file   Years of education: Not on file   Highest education level: Some college, no degree  Occupational History   Not on file  Tobacco Use   Smoking status: Never   Smokeless tobacco: Never  Vaping Use   Vaping status: Never Used  Substance and Sexual Activity   Alcohol use: Not Currently   Drug use: No   Sexual activity: Yes    Partners: Female    Birth control/protection: I.U.D.    Comment: partner  Other Topics Concern   Not on file  Social History Narrative   Work or  School: full time [employer] Situation: lives with female partner - trying for a childSpiritual Beliefs:Lifestyle:   Right handed   Social Drivers of Health   Financial Resource Strain: High Risk (05/17/2023)   Overall Financial Resource Strain (CARDIA)    Difficulty of Paying Living Expenses: Very hard  Food Insecurity: Food Insecurity Present (05/17/2023)   Hunger Vital Sign    Worried About Running Out of Food in the Last Year: Sometimes true    Ran Out of Food in the Last Year: Never true  Transportation Needs: Patient Declined (05/17/2023)   PRAPARE - Transportation    Lack of Transportation (Medical): Patient declined    Lack of Transportation (Non-Medical): Patient declined  Physical Activity: Sufficiently Active (05/17/2023)   Exercise Vital Sign    Days of Exercise per Week: 5 days    Minutes of Exercise per Session: 60 min  Stress: Stress Concern Present (05/17/2023)   [employer] of Occupational Health - Occupational Stress Questionnaire    Feeling of Stress : Very much  Social Connections: Unknown (05/17/2023)   Social Connection and Isolation Panel [NHANES]    Frequency of Communication with Friends and Family: Patient declined    Frequency of Social Gatherings with Friends and Family: Patient declined    Attends Religious Services: Patient declined    Database administrator or Organizations: Patient declined  Attends Banker Meetings: Not on file    Marital Status: Married   Past Surgical History:  Procedure Laterality Date   ANKLE RECONSTRUCTION Left 04/08/2014   Procedure: RECONSTRUCTION LEFT ANKLE, REPAIR SECONDARY DISRUPTED LIGAMENT ANKLE COLLATERAL, TRANSPLANTERIOR/TRANSFER TENDON TIBIAL EXTENSOR INTO MIDFOOT DEEP;  Surgeon: Thera Flake., MD;  Location: [facility];  Service: Orthopedics;  Laterality: Left;   ARTHROTOMY Bilateral 12/23/2013   Procedure: BILATERAL ARTHROTOMY, MENISECTOMY, TEMPORALIS FASCIA GRAFT;  Surgeon:  Georgia Lopes, DDS;  Location: [facility];  Service: Oral Surgery;  Laterality: Bilateral;   TMJ   LAPAROSCOPY N/A 06/24/2013   Procedure: LAPAROSCOPY OPERATIVE WITH CHROMOPERTUBATION, LYSIS OF ADHESIONS AND APPENDECTOMY ;  Surgeon: Mitchel Honour, DO;  Location: [facility];  Service: Gynecology;  Laterality: N/A;   LAPAROSCOPY N/A 08/07/2016   Procedure: LAPAROSCOPY OPERATIVE, FULGERATION  OF ENDOMETRIOSIS ;  Surgeon: Mitchel Honour, DO;  Location: [facility];  Service: Gynecology;  Laterality: N/A;   LIGAMENT REPAIR Right 01/13/2014   Procedure: RIGHT ANKLE REPAIR SECONDAY DISRUPTED LIGAMENT ANKLE  COLLATERAL WITH PERONEAL TENDON AUTOGRAFT;  Surgeon: Thera Flake., MD;  Location: [facility];  Service: Orthopedics;  Laterality: Right;   TMJ ARTHROCENTESIS Bilateral 07-04-2012   w/  IV Sedation in office   WISDOM TOOTH EXTRACTION  2004   Past Surgical History:  Procedure Laterality Date   ANKLE RECONSTRUCTION Left 04/08/2014   Procedure: RECONSTRUCTION LEFT ANKLE, REPAIR SECONDARY DISRUPTED LIGAMENT ANKLE COLLATERAL, TRANSPLANTERIOR/TRANSFER TENDON TIBIAL EXTENSOR INTO MIDFOOT DEEP;  Surgeon: Thera Flake., MD;  Location: [facility];  Service: Orthopedics;  Laterality: Left;   ARTHROTOMY Bilateral 12/23/2013   Procedure: BILATERAL ARTHROTOMY, MENISECTOMY, TEMPORALIS FASCIA GRAFT;  Surgeon: Georgia Lopes, DDS;  Location: [facility];  Service: Oral Surgery;  Laterality: Bilateral;   TMJ   LAPAROSCOPY N/A 06/24/2013   Procedure: LAPAROSCOPY OPERATIVE WITH CHROMOPERTUBATION, LYSIS OF ADHESIONS AND APPENDECTOMY ;  Surgeon: Mitchel Honour, DO;  Location: [facility];  Service: Gynecology;  Laterality: N/A;   LAPAROSCOPY N/A 08/07/2016   Procedure: LAPAROSCOPY OPERATIVE, FULGERATION  OF ENDOMETRIOSIS ;  Surgeon: Mitchel Honour, DO;  Location: [facility];  Service: Gynecology;  Laterality: N/A;   LIGAMENT REPAIR Right 01/13/2014   Procedure: RIGHT ANKLE REPAIR SECONDAY  DISRUPTED LIGAMENT ANKLE  COLLATERAL WITH PERONEAL TENDON AUTOGRAFT;  Surgeon: Thera Flake., MD;  Location: [facility];  Service: Orthopedics;  Laterality: Right;   TMJ ARTHROCENTESIS Bilateral 07-04-2012   w/  IV Sedation in office   WISDOM TOOTH EXTRACTION  2004   Past Medical History:  Diagnosis Date   Acne    sees dermatologist   Anxiety and depression    Ehlers-Danlos syndrome    per pt dx with her rheuamtologist  (joint hypermobility, connective tissue disorder)   Endometriosis    Fibromyalgia    History of concussion    2009-- no residual   History of melasma    skin depigmentation   Interstitial cystitis    Migraine    Osteoarthritis    jaw, left ankle, knees   Panic disorder    hx pain attacks   PCOS (polycystic ovarian syndrome)    Pelvic pain in female    Seasonal allergies    Subclinical hyperthyroidism    sees endocrinologist--- DR Elvera Lennox   TMJ syndrome    There were no vitals taken for this visit.  Opioid Risk Score:   Fall Risk Score:  `1  Depression screen PHQ 2/9     05/07/2023    1:30 PM 04/05/2023    8:25 AM  Depression screen PHQ 2/9  Decreased Interest 2 1  Down, Depressed, Hopeless 2 1  PHQ - 2 Score 4 2  Altered sleeping 2 3  Tired, decreased energy 2 1  Change in appetite 1 0  Feeling bad or failure about yourself  1 0  Trouble concentrating 1 1  Moving slowly or fidgety/restless 0 0  Suicidal thoughts 0 0  PHQ-9 Score 11 7  Difficult doing work/chores Extremely dIfficult Somewhat difficult    Review of Systems     Objective:   Physical Exam        Assessment & Plan:

## 2023-07-30 ENCOUNTER — Encounter
Payer: Medicaid Other | Attending: Physical Medicine and Rehabilitation | Admitting: Physical Medicine and Rehabilitation

## 2023-07-30 ENCOUNTER — Encounter: Payer: Self-pay | Admitting: Physical Medicine and Rehabilitation

## 2023-07-30 DIAGNOSIS — M533 Sacrococcygeal disorders, not elsewhere classified: Secondary | ICD-10-CM | POA: Insufficient documentation

## 2023-07-30 DIAGNOSIS — M797 Fibromyalgia: Secondary | ICD-10-CM | POA: Insufficient documentation

## 2023-07-30 DIAGNOSIS — G894 Chronic pain syndrome: Secondary | ICD-10-CM | POA: Insufficient documentation

## 2023-07-30 DIAGNOSIS — G8929 Other chronic pain: Secondary | ICD-10-CM | POA: Insufficient documentation

## 2023-07-30 DIAGNOSIS — Q796 Ehlers-Danlos syndrome, unspecified: Secondary | ICD-10-CM | POA: Insufficient documentation

## 2023-07-30 DIAGNOSIS — G4700 Insomnia, unspecified: Secondary | ICD-10-CM | POA: Insufficient documentation

## 2023-07-31 MED ORDER — TIZANIDINE HCL 4 MG PO TABS: 4.0000 mg | ORAL_TABLET | Freq: Three times a day (TID) | ORAL | 5 refills | Status: DC | PRN

## 2023-07-31 MED ORDER — NALTREXONE HCL (PAIN) 1.5 MG PO CAPS: 3.0000 mg | ORAL_CAPSULE | Freq: Every day | ORAL | 5 refills | Status: AC

## 2023-08-02 NOTE — Progress Notes (Signed)
  Cardiology Office Note:  .   Date:  08/05/2023  ID:  Casey Cox, DOB Sep 08, 1975, MRN [id number] PCP: Martina Sinner, NP  [facility] Providers Cardiologist:  None    History of Present Illness: .   Casey Cox is a 48 y.o. female with Ehlers-Danlos syndrome     Seen with partner, Casey Cox.    Has polyarthralgia , hypermobility Hx of migraine headaches , chronic headaches  Has dysautonomia  Has had tilt table testing   Is on propranolol 20 TID as needed. 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  Is seeing a neurologist   Will be getting a brain MRI scan, with select imaging to her nasal cavities       ROS:   Studies Reviewed: Marland Kitchen   EKG Interpretation Date/Time:  Friday August 03 2023 08:57:00 EDT Ventricular Rate:  65 PR Interval:  150 QRS Duration:  84 QT Interval:  396 QTC Calculation: 411 R Axis:   85  Text Interpretation: Normal sinus rhythm with sinus arrhythmia Normal ECG When compared with ECG of 03-Apr-2021 08:40, HR is slower, otherwise No significant change since last tracing Confirmed by Kristeen Miss ([id number]) on 08/05/2023 9:30:30 PM     Risk Assessment/Calculations:      Physical Exam:   VS:  BP 124/74   Pulse 73   Ht 5\' 2"  (1.575 m)   Wt 145 lb 3.2 oz (65.9 kg)   SpO2 99%   BMI 26.56 kg/m    Wt Readings from Last 3 Encounters:  08/03/23 145 lb 3.2 oz (65.9 kg)  06/20/23 146 lb (66.2 kg)  05/17/23 142 lb 9.6 oz (64.7 kg)    GEN: Well nourished, well developed in no acute distress NECK: No JVD; No carotid bruits CARDIAC: RRR, no murmurs, rubs, gallops RESPIRATORY:  Clear to auscultation without rales, wheezing or rhonchi  ABDOMEN: Soft, non-tender, non-distended EXTREMITIES:  No edema; No deformity   ASSESSMENT AND PLAN: .   1.  Ehlers-Danlos syndrome: She has not had Ehlers-Danlos for years.  She has associated polyarthralgia,  dysautonomia.  She has never  had genetic testing .  Will refer her to Dr. Sidney Ace for further genetic testing.  She may have some other genetic issues.  2.  Dysautonomia.  She takes propranolol 3 times a day.  She thinks that she would do better on propranolol 20 mg 4 times a day.     Will have her follow-up with [place] in 6 months.   Dispo:   Signed, Kristeen Miss, MD

## 2023-08-03 ENCOUNTER — Ambulatory Visit: Payer: Medicaid Other | Attending: Cardiovascular Disease | Admitting: Cardiovascular Disease

## 2023-08-03 ENCOUNTER — Encounter: Payer: Self-pay | Admitting: Cardiovascular Disease

## 2023-08-03 VITALS — BP 124/74 | HR 73 | Ht 62.0 in | Wt 145.2 lb

## 2023-08-03 DIAGNOSIS — Q796 Ehlers-Danlos syndrome, unspecified: Secondary | ICD-10-CM

## 2023-08-03 DIAGNOSIS — Z0000 Encounter for general adult medical examination without abnormal findings: Secondary | ICD-10-CM | POA: Diagnosis not present

## 2023-08-03 MED ORDER — PROPRANOLOL HCL 20 MG PO TABS: 20.0000 mg | ORAL_TABLET | Freq: Four times a day (QID) | ORAL | 2 refills | Status: AC

## 2023-08-03 NOTE — Patient Instructions (Addendum)
 Medication Instructions:  Your physician has recommended you make the following change in your medication:  INCREASE Propranolol to 20 mg FOUR times daily  *If you need a refill on your cardiac medications before your next appointment, please call your pharmacy*   Lab Work: None ordered  If you have any lab test that is abnormal or we need to change your treatment, we will call you to review the results.   Testing/Procedures: None ordered   Follow-Up: At [facility], you and your health needs are our priority.  As part of our continuing mission to provide you with exceptional heart care, we have created designated Provider Care Teams.  These Care Teams include your primary Cardiologist (physician) and Advanced Practice Providers (APPs -  Physician Assistants and Nurse Practitioners) who all work together to provide you with the care you need, when you need it.  Your next appointment:   6 month(s)  The format for your next appointment:   In Person  Provider:   Dr. Prescott Parma have been referred to Dr. Sidney Ace (genetics)    Thank you for choosing [facility]!!    [phone]

## 2023-08-13 ENCOUNTER — Other Ambulatory Visit: Payer: Self-pay | Admitting: Nurse Practitioner

## 2023-08-17 ENCOUNTER — Other Ambulatory Visit: Payer: Self-pay | Admitting: Nurse Practitioner

## 2023-08-17 DIAGNOSIS — F39 Unspecified mood [affective] disorder: Secondary | ICD-10-CM

## 2023-08-17 DIAGNOSIS — F902 Attention-deficit hyperactivity disorder, combined type: Secondary | ICD-10-CM

## 2023-08-28 ENCOUNTER — Other Ambulatory Visit: Payer: Self-pay | Admitting: Nurse Practitioner

## 2023-08-28 DIAGNOSIS — F902 Attention-deficit hyperactivity disorder, combined type: Secondary | ICD-10-CM

## 2023-08-28 DIAGNOSIS — F39 Unspecified mood [affective] disorder: Secondary | ICD-10-CM

## 2023-09-05 ENCOUNTER — Ambulatory Visit: Payer: Medicaid Other | Admitting: Cardiology

## 2023-09-11 NOTE — Telephone Encounter (Signed)
 Pharmacy Patient Advocate Encounter  Received notification from [employer] that Prior Authorization for [facility]  has been DENIED.  Full denial letter will be uploaded to the media tab. See denial reason below.   PA #/Case ID/Reference #: [id number]

## 2023-10-20 ENCOUNTER — Encounter: Payer: Self-pay | Admitting: Physical Medicine and Rehabilitation

## 2023-11-01 ENCOUNTER — Encounter: Payer: Self-pay | Admitting: Neurology

## 2023-11-15 ENCOUNTER — Ambulatory Visit: Payer: Medicaid Other | Admitting: Nurse Practitioner

## 2023-11-18 ENCOUNTER — Ambulatory Visit
Admission: RE | Admit: 2023-11-18 | Discharge: 2023-11-18 | Disposition: A | Discharge status: Home / Self Care | Source: Ambulatory Visit | Attending: Neurology

## 2023-11-18 DIAGNOSIS — J3489 Other specified disorders of nose and nasal sinuses: Secondary | ICD-10-CM

## 2023-11-18 DIAGNOSIS — G43E09 Chronic migraine with aura, not intractable, without status migrainosus: Secondary | ICD-10-CM

## 2023-11-18 MED ORDER — GADOPICLENOL 0.5 MMOL/ML IV SOLN
6.0000 mL | Freq: Once | INTRAVENOUS | Status: AC | PRN
  Administered 2023-11-18: 6 mL via INTRAVENOUS

## 2023-11-19 DIAGNOSIS — G43109 Migraine with aura, not intractable, without status migrainosus: Secondary | ICD-10-CM | POA: Diagnosis not present

## 2023-11-20 ENCOUNTER — Other Ambulatory Visit: Payer: Self-pay | Admitting: Nurse Practitioner

## 2023-11-20 DIAGNOSIS — F902 Attention-deficit hyperactivity disorder, combined type: Secondary | ICD-10-CM

## 2023-11-20 DIAGNOSIS — F39 Unspecified mood [affective] disorder: Secondary | ICD-10-CM

## 2023-11-24 ENCOUNTER — Other Ambulatory Visit: Payer: Self-pay | Admitting: Nurse Practitioner

## 2023-11-24 DIAGNOSIS — F39 Unspecified mood [affective] disorder: Secondary | ICD-10-CM

## 2023-11-24 DIAGNOSIS — F902 Attention-deficit hyperactivity disorder, combined type: Secondary | ICD-10-CM

## 2023-11-28 ENCOUNTER — Ambulatory Visit: Payer: Self-pay | Admitting: Neurology

## 2023-12-04 NOTE — Progress Notes (Signed)
Tried calling patient no answer. LMOVM to call back.

## 2023-12-17 NOTE — Progress Notes (Signed)
 NEUROLOGY FOLLOW UP OFFICE NOTE  QUENISHA LOVINS [id number]  Assessment/Plan:   Chronic migraine with aura, without status migrainosus, not intractable Chronic cervicogenic headaches Clear rhinorrhea, reports salty - concern for CSF leak.  No source seen on imaging.   Ehlers-Danlos Syndrome Craniocervical instability Dysautonomia   Regarding rhinorrhea, will refer to ENT for further evaluation Migraine prevention:  Plan is to start Emgality .  I raised concern of constipation in setting of gastroparesis.  She thinks she will likely be able to manage symptoms and would like to proceed.  Given gastroparesis, would try a non-oral preventative.  Otherwise, consider Botox, although would have to be cautious in setting of connective tissue disease.   Migraine rescue:  Ubrelvy  100mg  Limit use of pain relievers to no more than 9 days out of the month to prevent risk of rebound or medication-overuse headache. Keep headache diary Consider supplements/vitamins:  Magnesium citrate, CoQ10, riboflavin Follow up in 6 months.   Total time spent in chart and face to face with patient:  80 minutes.   Subjective:  DORI DEVINO is a 48 year old female with Ehlers-Danlos syndrome, dysautonomia, TMJ dysfunction s/p arthrotomy, fibromyalgia, IBS, gastroparesis, PCOS, PTSD, ADHD, OCD, depression and anxiety who follows up for migraines.  UPDATE: MRI of brain with and without contrast with CISS on 11/18/2023 was unremarkable with no evidence of CSF leak.  Plan was to start Emgality  which was denied until she tried one antidepressant, antiepileptic, ACE inhibitor or CCB, but she did in fact meet criteria (she has tried amitriptyline and beta blocker) Intensity:  8/10 (5/10 to 9.5/10) Duration:  couple of hours with Ubrelvy  100mg  Frequency:  5-10 migraine days a month (more with cervicogenic headdache Current NSAIDS/analgesics:  Tylenol , Excedrin, lidocaine  5% patch Current triptans:  rizatriptan  10mg  Current ergotamine:  none Current anti-emetic:  Zofran  4mg  Current muscle relaxants:  tizanidine  4mg  TID PRN Current Antihypertensive medications:  propranolol  20mg  TID (anxiety) Current Antidepressant medications:  Wellbutrin  450mg  daily Current Anticonvulsant medications:  none Current anti-CGRP:  Ubrelvy  100mg  (samples - effective) Current Vitamins/Herbal/Supplements:  CoQ10 30mg  TID, turmeric, Ca, D3 Current Antihistamines/Decongestants:  Zyrtec Other therapy:  none Birth control:  Mirena Other medications:  alprazolam , naltrexone    Caffeine:  no coffee, soda, black tea Alcohol:  no Smoker:  no Diet:  Drinks a lot of water with electrolytes (64 oz daily).  Green tea.  Tries not to skip meals.  Protein drink if has gastroparesis flare and cannot eat.   Exercise:  unable due to joint pain/hypermobility and dysautonomia.  Tries to walk.  Tries to lift weights. Depression:  yes; Anxiety:  yes.   Sleep hygiene:  poor.  Mostly staying asleep  HISTORY: She began experiencing chronic migraines in 2000, which brought her to the ED where she underwent an LP that led to a post-LP headache requiring a blood patch. She has had constant headaches since then.  She always has a dull persistent headache.  Migraines are described as 7-9/10 left frontal or right sided throbbing/pressure headache.  Preceded by visual aura of either sparkles in her vision or dysmetropsia or with trouble getting words out.  This may last 5 to 20 minutes followed by the headache.  Headaches are associated with bilateral neck pain into the shoulders, nausea, photophobia, phonophobia, osmophobia and pulsatile tinnitus.  Typically lasts 6 hours but ranges from 1-2 hours up to several days.  She averages at least 15 migraine days a month.  As she has become perimenopausal, there is  greater association with her menstrual cycle.  Headaches are not positional.  Cool temperature and dark and quiet room help alleviate  symptoms.  She has Ehlers-Danlos syndrome with craniocervical instability which causes chronic neck pain and cervicogenic headaches.  It is a constant 6-7/10 pressure pain in the back of her head radiating down the back of her neck and into the shoulders.  Headaches are not positional.  MRI of cervical spine without contrast on 08/29/2017 revealed:  Degenerative changes of the cervical spine are most focal at C6-C7 secondary to a small posterior disc osteophyte complex and bilateral facet and uncovertebral joint hypertrophy resulting in moderate right and mild left foraminal stenosis.  She was previously followed by specialists, including neurology, at [facility].  She was evaluated for numbness and tingling in the hands as well as chronic pain.   NCV-EMG of upper extremities on 02/15/2018 was normal.  X-ray of lumbar spine from 12/25/2018 showed mild facet osteoarthritis at L4-L5 and L5-S1 bilaterally.  She also has history of dysautonomia presenting as positional lightheadedness and tachycardia.  Tilt table test on 04/17/2018 was negative.   Past NSAIDS/analgesics:  none Past abortive triptans:  sumatriptan tab Past abortive ergotamine:  none Past muscle relaxants:  Flexeril  Past anti-emetic:  promethazine  Past antihypertensive medications:  atenolol Past antidepressant medications:  amitriptyline, nortriptyline, duloxetine  Past anticonvulsant medications:  none Past anti-CGRP:  none Past vitamins/Herbal/Supplements:  none Past antihistamines/decongestants:  none Other past therapies:  dry needling, TENS unit, acupuncture, massage, chiropractic   Family history of headache:  mom (migraines), sister, brother  Family history of autoimmune disease:  nothing definitively diagnosed.    PAST MEDICAL HISTORY: Past Medical History:  Diagnosis Date   Acne    sees dermatologist   Anxiety and depression    Ehlers-Danlos syndrome    per pt dx with her rheuamtologist  (joint  hypermobility, connective tissue disorder)   Endometriosis    Fibromyalgia    History of concussion    2009-- no residual   History of melasma    skin depigmentation   Interstitial cystitis    Migraine    Osteoarthritis    jaw, left ankle, knees   Panic disorder    hx pain attacks   PCOS (polycystic ovarian syndrome)    Pelvic pain in female    Seasonal allergies    Subclinical hyperthyroidism    sees endocrinologist--- DR TRIXIE   TMJ syndrome     MEDICATIONS: Current Outpatient Medications on File Prior to Visit  Medication Sig Dispense Refill   b complex vitamins capsule Take 1 capsule by mouth daily.     Barberry-Oreg Grape-Goldenseal (BERBERINE COMPLEX PO) Take by mouth.     buPROPion  (WELLBUTRIN  SR) 150 MG 12 hr tablet Take 1 tablet by mouth daily.Take with the 300 mg XR in the AM for a total of 450 mg daily **NEEDS TO BE SEEN BEFORE NEXT REFILL** 30 tablet 0   buPROPion  (WELLBUTRIN  XL) 300 MG 24 hr tablet Take 1 tablet (300 mg total) by mouth daily. Take with 150 mg for a total of 450 mg daily **NEEDS TO BE SEEN BEFORE NEXT REFILL** 30 tablet 0   Calcium Polycarbophil (FIBER-CAPS PO) Take by mouth daily. (Patient not taking: Reported on 08/03/2023)     cetirizine (ZYRTEC) 10 MG chewable tablet Chew 10 mg by mouth daily.     Cholecalciferol (VITAMIN D3 PO) Take 4,000 Int'l Units by mouth every morning. As needed     co-enzyme Q-10 30 MG  capsule Take 30 mg by mouth 3 (three) times daily. (Patient not taking: Reported on 08/03/2023)     CREATINE PO Take by mouth.     cycloSPORINE (RESTASIS) 0.05 % ophthalmic emulsion Place 1 drop into both eyes 2 (two) times daily.     dicyclomine  (BENTYL ) 10 MG capsule TAKE 1 CAPSULE (10 MG TOTAL) BY MOUTH 4 TIMES A DAY BEFORE MEALS AND AT BEDTIME 90 capsule 1   estradiol  (VIVELLE -DOT) 0.0375 MG/24HR 1 patch.     famotidine  (PEPCID ) 10 MG tablet Take 10 mg by mouth 2 (two) times daily.     levonorgestrel (MIRENA) 20 MCG/24HR IUD 1 each by  Intrauterine route once. PLACED 09/ 2015     lidocaine  (LIDODERM ) 5 % Place 1 patch onto the skin daily. Remove & Discard patch within 12 hours or as directed by MD 60 patch 2   Misc Natural Products (FIBER 7 PO) Take by mouth.     Naltrexone  HCl, Pain, 1.5 MG CAPS Take 3 mg by mouth at bedtime. 60 capsule 5   Omega-3 Fatty Acids (OMEGA 3 500 PO)      ondansetron  (ZOFRAN ) 4 MG tablet Take 1 tablet (4 mg total) by mouth every 8 (eight) hours as needed for nausea or vomiting. 30 tablet 0   PREMARIN vaginal cream 0.5 G PER VAGINA NIGHTLY 2-3 TIMES PER WEEK     propranolol  (INDERAL ) 20 MG tablet Take 1 tablet (20 mg total) by mouth 4 (four) times daily. 360 tablet 2   RETIN-A 0.1 % cream Apply topically at bedtime.     rizatriptan (MAXALT) 10 MG tablet Take 10 mg by mouth as needed for migraine. May repeat in 2 hours if needed     tiZANidine  (ZANAFLEX ) 4 MG tablet Take 1 tablet (4 mg total) by mouth 3 (three) times daily as needed for muscle spasms. 90 tablet 5   Turmeric (QC TUMERIC COMPLEX PO) Take by mouth.     Ubrogepant  (UBRELVY ) 100 MG TABS Samples of this drug were given to the patient, quantity 4, Lot Number [id number] [id number] 01/26     No current facility-administered medications on file prior to visit.    ALLERGIES: Allergies  Allergen Reactions   Amoxicillin Rash   Nickel Rash    FAMILY HISTORY: Family History  Problem Relation Age of Onset   Neuropathy Mother    Migraines Mother    Fibromyalgia Mother    Arthritis Mother    Cancer Mother        SKIN CA   Diabetes Father    Arthritis Father    Migraines Sister    Migraines Brother    Mental illness Maternal Grandmother    Cancer Maternal Grandfather        PROSTATE CA   Diabetes Maternal Grandfather    Dementia Paternal Grandmother    Diabetes Paternal Grandmother    Hyperlipidemia Paternal Grandmother    Hypertension Paternal Grandmother    Hypertension Paternal Grandfather    Arthritis Paternal Grandfather    Heart  disease Paternal Grandfather       Objective:  Blood pressure 130/82, pulse 67, height 5' 2 (1.575 m), weight 142 lb 9.6 oz (64.7 kg), SpO2 100%. General: No acute distress.  Patient appears well-groomed.    Juliene Dunnings, DO  CC: Nena Cassis Morton Hummer, NP

## 2023-12-18 ENCOUNTER — Encounter: Payer: Self-pay | Admitting: Neurology

## 2023-12-18 ENCOUNTER — Telehealth: Payer: Self-pay | Admitting: Pharmacy Technician

## 2023-12-18 ENCOUNTER — Ambulatory Visit: Payer: Medicaid Other | Admitting: Neurology

## 2023-12-18 ENCOUNTER — Telehealth: Payer: Self-pay

## 2023-12-18 ENCOUNTER — Other Ambulatory Visit (HOSPITAL_COMMUNITY): Payer: Self-pay

## 2023-12-18 VITALS — BP 130/82 | HR 67 | Ht 62.0 in | Wt 142.6 lb

## 2023-12-18 DIAGNOSIS — G4486 Cervicogenic headache: Secondary | ICD-10-CM

## 2023-12-18 DIAGNOSIS — Q796 Ehlers-Danlos syndrome, unspecified: Secondary | ICD-10-CM | POA: Diagnosis not present

## 2023-12-18 DIAGNOSIS — G43109 Migraine with aura, not intractable, without status migrainosus: Secondary | ICD-10-CM | POA: Diagnosis not present

## 2023-12-18 DIAGNOSIS — J3489 Other specified disorders of nose and nasal sinuses: Secondary | ICD-10-CM

## 2023-12-18 MED ORDER — EMGALITY 120 MG/ML ~~LOC~~ SOAJ: 120.0000 mg | SUBCUTANEOUS | 11 refills | Status: AC

## 2023-12-18 MED ORDER — EMGALITY 120 MG/ML ~~LOC~~ SOAJ
240.0000 mg | Freq: Once | SUBCUTANEOUS | 0 refills | Status: AC
Start: 2023-12-18 — End: 2023-12-18

## 2023-12-18 MED ORDER — UBRELVY 100 MG PO TABS: 1.0000 | ORAL_TABLET | ORAL | 11 refills | Status: AC | PRN

## 2023-12-18 NOTE — Patient Instructions (Signed)
 Start Emgality  - 2 injections for first dose, then 1 injection every 28 days thereafter.  If no improvement in 3 months, contact me Ubrelvy  as needed. Will follow up with radiology and contact you.  If imaging is unremarkable, send to ENT Follow up 8 months.

## 2023-12-18 NOTE — Telephone Encounter (Signed)
 Patient seen in office today.  Per patient she has tried all medications required for Emgality .    PA needed for Ubrelvy 

## 2023-12-18 NOTE — Telephone Encounter (Signed)
 Pharmacy Patient Advocate Encounter   Received notification from Pt Calls Messages that prior authorization for UBRELVY  100MG  is required/requested.   Insurance verification completed.   The patient is insured through [facility] .   Per test claim: PA required; PA submitted to above mentioned insurance via CoverMyMeds Key/confirmation #/EOC [id number] Status is pending

## 2023-12-18 NOTE — Telephone Encounter (Signed)
 Pharmacy Patient Advocate Encounter   Received notification from Pt Calls Messages that prior authorization for EMGALITY  120MG  LOADING is required/requested.   Insurance verification completed.   The patient is insured through [facility] .   Per test claim: PA required; PA submitted to above mentioned insurance via CoverMyMeds Key/confirmation #/EOC [id number] Status is pending

## 2023-12-18 NOTE — Telephone Encounter (Signed)
 PA has been submitted, and telephone encounter has been created. Please see telephone encounter dated 7.22.25.

## 2023-12-19 NOTE — Telephone Encounter (Signed)
 Pharmacy Patient Advocate Encounter  Received notification from [employer] that Prior Authorization for Ubrelvy  100MG  tablets has been APPROVED from 12-18-2023 to 12-17-2024   PA #/Case ID/Reference #: [id number]

## 2023-12-19 NOTE — Telephone Encounter (Signed)
 Pharmacy Patient Advocate Encounter  Received notification from [facility] that Prior Authorization for Emgality  120MG /ML auto-injectors (migraine) has been APPROVED from 12-18-2023 to 03-19-2024   PA #/Case ID/Reference #: [id number]

## 2023-12-27 ENCOUNTER — Other Ambulatory Visit: Payer: Self-pay | Admitting: Nurse Practitioner

## 2023-12-27 ENCOUNTER — Telehealth: Admitting: Nurse Practitioner

## 2023-12-27 ENCOUNTER — Encounter: Payer: Self-pay | Admitting: Nurse Practitioner

## 2023-12-27 DIAGNOSIS — D2239 Melanocytic nevi of other parts of face: Secondary | ICD-10-CM

## 2023-12-27 DIAGNOSIS — F902 Attention-deficit hyperactivity disorder, combined type: Secondary | ICD-10-CM

## 2023-12-27 DIAGNOSIS — Z872 Personal history of diseases of the skin and subcutaneous tissue: Secondary | ICD-10-CM | POA: Diagnosis not present

## 2023-12-27 DIAGNOSIS — F39 Unspecified mood [affective] disorder: Secondary | ICD-10-CM

## 2023-12-27 MED ORDER — TRETINOIN 0.1 % EX CREA: TOPICAL_CREAM | Freq: Every day | CUTANEOUS | 1 refills | Status: DC

## 2023-12-27 NOTE — Telephone Encounter (Signed)
 Left detailed message for patient to call back and make an appt to see PCP to get more refills on her BUPROPION  Rx.

## 2023-12-27 NOTE — Progress Notes (Signed)
 Virtual Visit via video Note Due to COVID-19 pandemic this visit was conducted virtually. This visit type was conducted due to national recommendations for restrictions regarding the COVID-19 Pandemic (e.g. social distancing, sheltering in place) in an effort to limit this patient's exposure and mitigate transmission in our community. All issues noted in this document were discussed and addressed.  A physical exam was not performed with this format.   I connected with Casey Cox on 12/27/2023 at 1005 by name and DOB and verified that I am speaking with the correct person using two identifiers. Casey Cox is currently located at home  during visit. The provider, Nena Deitra Morton Sebastian, DNP is located in their office at time of visit.  I discussed the limitations, risks, security and privacy concerns of performing an evaluation and management service by virtual visit and the availability of in person appointments. I also discussed with the patient that there may be a patient responsible charge related to this service. The patient expressed understanding and agreed to proceed.  Subjective:  Patient ID: Casey Cox, female    DOB: 02-26-1976, 48 y.o.   MRN: [id number]  Chief Complaint:  Little spot on my nose that's been there for years that I  (Hx of melasma, need med refill)   HPI: Casey Cox is a 48 y.o. female presenting via telehealth on 12/27/2023 for Little spot on my nose that's been there for years that I  (Hx of melasma, need med refill)  The patient presents with a concern about a small spot on the nose that has been present for several years. She reports that the spot is unicolored and non-painful. She has a known history of melasma, which has been managed effectively with tretinoin  cream. She states that the treatment has been helpful and is requesting a refill of the medication.  Relevant past medical, surgical, family, and social history reviewed and  updated as indicated.  Allergies and medications reviewed and updated.   Past Medical History:  Diagnosis Date   Acne    sees dermatologist   Anxiety and depression    Ehlers-Danlos syndrome    per pt dx with her rheuamtologist  (joint hypermobility, connective tissue disorder)   Endometriosis    Fibromyalgia    History of concussion    2009-- no residual   History of melasma    skin depigmentation   Interstitial cystitis    Migraine    Osteoarthritis    jaw, left ankle, knees   Panic disorder    hx pain attacks   PCOS (polycystic ovarian syndrome)    Pelvic pain in female    Seasonal allergies    Subclinical hyperthyroidism    sees endocrinologist--- DR TRIXIE   TMJ syndrome     Past Surgical History:  Procedure Laterality Date   ANKLE RECONSTRUCTION Left 04/08/2014   Procedure: RECONSTRUCTION LEFT ANKLE, REPAIR SECONDARY DISRUPTED LIGAMENT ANKLE COLLATERAL, TRANSPLANTERIOR/TRANSFER TENDON TIBIAL EXTENSOR INTO MIDFOOT DEEP;  Surgeon: LELON JONETTA Shari Mickey., MD;  Location: [facility];  Service: Orthopedics;  Laterality: Left;   ARTHROTOMY Bilateral 12/23/2013   Procedure: BILATERAL ARTHROTOMY, MENISECTOMY, TEMPORALIS FASCIA GRAFT;  Surgeon: Glendia CHRISTELLA Primrose, DDS;  Location: [facility];  Service: Oral Surgery;  Laterality: Bilateral;   TMJ   LAPAROSCOPY N/A 06/24/2013   Procedure: LAPAROSCOPY OPERATIVE WITH CHROMOPERTUBATION, LYSIS OF ADHESIONS AND APPENDECTOMY ;  Surgeon: Duwaine Blumenthal, DO;  Location: [facility];  Service: Gynecology;  Laterality: N/A;   LAPAROSCOPY N/A  08/07/2016   Procedure: LAPAROSCOPY OPERATIVE, FULGERATION  OF ENDOMETRIOSIS ;  Surgeon: Duwaine Blumenthal, DO;  Location: [facility];  Service: Gynecology;  Laterality: N/A;   LIGAMENT REPAIR Right 01/13/2014   Procedure: RIGHT ANKLE REPAIR SECONDAY DISRUPTED LIGAMENT ANKLE  COLLATERAL WITH PERONEAL TENDON AUTOGRAFT;  Surgeon: LELON JONETTA Shari Mickey., MD;  Location: [facility];  Service:  Orthopedics;  Laterality: Right;   TMJ ARTHROCENTESIS Bilateral 07-04-2012   w/  IV Sedation in office   WISDOM TOOTH EXTRACTION  2004    Social History   Socioeconomic History   Marital status: Married    Spouse name: Not on file   Number of children: Not on file   Years of education: Not on file   Highest education level: Some college, no degree  Occupational History   Not on file  Tobacco Use   Smoking status: Never   Smokeless tobacco: Never  Vaping Use   Vaping status: Never Used  Substance and Sexual Activity   Alcohol use: Not Currently   Drug use: No   Sexual activity: Yes    Partners: Female    Birth control/protection: I.U.D.    Comment: partner  Other Topics Concern   Not on file  Social History Narrative   Work or School: full time [employer] Situation: lives with female partner - trying for a childSpiritual Beliefs:Lifestyle:   Right handed   Social Drivers of Health   Financial Resource Strain: Patient Declined (12/26/2023)   Overall Financial Resource Strain (CARDIA)    Difficulty of Paying Living Expenses: Patient declined  Food Insecurity: Patient Declined (12/26/2023)   Hunger Vital Sign    Worried About Running Out of Food in the Last Year: Patient declined    Ran Out of Food in the Last Year: Patient declined  Transportation Needs: Patient Declined (12/26/2023)   PRAPARE - Administrator, Civil Service (Medical): Patient declined    Lack of Transportation (Non-Medical): Patient declined  Physical Activity: Sufficiently Active (12/26/2023)   Exercise Vital Sign    Days of Exercise per Week: 6 days    Minutes of Exercise per Session: 60 min  Stress: Stress Concern Present (12/26/2023)   [employer] of Occupational Health - Occupational Stress Questionnaire    Feeling of Stress: To some extent  Social Connections: Unknown (12/26/2023)   Social Connection and Isolation Panel    Frequency of Communication with Friends and Family:  Patient declined    Frequency of Social Gatherings with Friends and Family: Patient declined    Attends Religious Services: Patient declined    Active Member of Clubs or Organizations: Patient declined    Attends Banker Meetings: Not on file    Marital Status: Patient declined  Intimate Partner Violence: Not At Risk (10/25/2020)   Received from [facility] visits prior to 07/29/2022.   Humiliation, Afraid, Rape, and Kick questionnaire    Fear of Current or Ex-Partner: No    Emotionally Abused: No    Physically Abused: No    Sexually Abused: No    Outpatient Encounter Medications as of 12/27/2023  Medication Sig   tretinoin  (RETIN-A ) 0.1 % cream Apply topically at bedtime.   b complex vitamins capsule Take 1 capsule by mouth daily.   Barberry-Oreg Grape-Goldenseal (BERBERINE COMPLEX PO) Take by mouth.   buPROPion  (WELLBUTRIN  SR) 150 MG 12 hr tablet Take 1 tablet by mouth daily.Take with the 300 mg XR in the AM for a total  of 450 mg daily **NEEDS TO BE SEEN BEFORE NEXT REFILL**   buPROPion  (WELLBUTRIN  XL) 300 MG 24 hr tablet Take 1 tablet (300 mg total) by mouth daily. Take with 150 mg for a total of 450 mg daily **NEEDS TO BE SEEN BEFORE NEXT REFILL**   cetirizine (ZYRTEC) 10 MG chewable tablet Chew 10 mg by mouth daily.   Cholecalciferol (VITAMIN D3 PO) Take 4,000 Int'l Units by mouth every morning. As needed   CREATINE PO Take by mouth.   cycloSPORINE (RESTASIS) 0.05 % ophthalmic emulsion Place 1 drop into both eyes 2 (two) times daily.   dicyclomine  (BENTYL ) 10 MG capsule TAKE 1 CAPSULE (10 MG TOTAL) BY MOUTH 4 TIMES A DAY BEFORE MEALS AND AT BEDTIME   estradiol  (VIVELLE -DOT) 0.0375 MG/24HR 1 patch.   famotidine  (PEPCID ) 10 MG tablet Take 10 mg by mouth 2 (two) times daily.   Galcanezumab -gnlm (EMGALITY ) 120 MG/ML SOAJ Inject 120 mg into the skin every 28 (twenty-eight) days.   levonorgestrel (MIRENA) 20 MCG/24HR IUD 1 each by Intrauterine route once.  PLACED 09/ 2015   lidocaine  (LIDODERM ) 5 % Place 1 patch onto the skin daily. Remove & Discard patch within 12 hours or as directed by MD   Misc Natural Products (FIBER 7 PO) Take by mouth.   Naltrexone  HCl, Pain, 1.5 MG CAPS Take 3 mg by mouth at bedtime.   Omega-3 Fatty Acids (OMEGA 3 500 PO)    ondansetron  (ZOFRAN ) 4 MG tablet Take 1 tablet (4 mg total) by mouth every 8 (eight) hours as needed for nausea or vomiting.   PREMARIN vaginal cream 0.5 G PER VAGINA NIGHTLY 2-3 TIMES PER WEEK   propranolol  (INDERAL ) 20 MG tablet Take 1 tablet (20 mg total) by mouth 4 (four) times daily.   rizatriptan (MAXALT) 10 MG tablet Take 10 mg by mouth as needed for migraine. May repeat in 2 hours if needed   tiZANidine  (ZANAFLEX ) 4 MG tablet Take 1 tablet (4 mg total) by mouth 3 (three) times daily as needed for muscle spasms.   Turmeric (QC TUMERIC COMPLEX PO) Take by mouth.   Ubrogepant  (UBRELVY ) 100 MG TABS Take 1 tablet (100 mg total) by mouth as needed. May repeat after 2 hours.  Maximum 2 tablets in 24 hours.   [DISCONTINUED] RETIN-A  0.1 % cream Apply topically at bedtime.   No facility-administered encounter medications on file as of 12/27/2023.    Allergies  Allergen Reactions   Amoxicillin Rash   Nickel Rash    Review of Systems  Constitutional:  Negative for chills and fever.  HENT:  Negative for congestion and sinus pressure.   Respiratory:  Negative for cough and shortness of breath.   Cardiovascular:  Negative for chest pain and leg swelling.  Skin:  Negative for color change and pallor.       Spot on the bridge of the nose  Neurological:  Negative for dizziness and headaches.    Observations/Objective: No vital signs or physical exam, this was a virtual health encounter.  Pt alert and oriented, answers all questions appropriately, and able to speak in full sentences.   Physical Exam Nursing note reviewed.  HENT:     Head: Normocephalic and atraumatic.  Eyes:     General: No  scleral icterus.    Extraocular Movements: Extraocular movements intact.     Conjunctiva/sclera: Conjunctivae normal.     Pupils: Pupils are equal, round, and reactive to light.  Pulmonary:     Effort: Pulmonary effort is  normal.  Skin:    General: Skin is warm and dry.     Comments: Since the visit is virtual, not able to visualized the spot, she will submit a pic post visit to be uploaded to her record  Neurological:     Mental Status: She is alert and oriented to person, place, and time.  Psychiatric:        Mood and Affect: Mood normal.        Behavior: Behavior normal.        Thought Content: Thought content normal.        Judgment: Judgment normal.     Assessment and Plan: Remee Charley was seen today for little spot on my nose that's been there for years that i .  Diagnoses and all orders for this visit:  History of melasma -     tretinoin  (RETIN-A ) 0.1 % cream; Apply topically at bedtime.  Nevus of nose -     Ambulatory referral to Dermatology   Braeleigh is a 48 year old Caucasian female seen today via telehealth for nevi: No acute distress Melasma: Continue Retin-A , refill provided  Nevus of nose: Ambulatory referral to dermatology  The above assessment and management plan was discussed with the patient. The patient verbalized understanding of and has agreed to the management plan. Patient is aware to call the clinic if they develop any new symptoms or if symptoms persist or worsen. Patient is aware when to return to the clinic for a follow-up visit. Patient educated on when it is appropriate to go to the emergency department.  Follow Up Instructions: Return if symptoms worsen or fail to improve.    I discussed the assessment and treatment plan with the patient. The patient was provided an opportunity to ask questions and all were answered. The patient agreed with the plan and demonstrated an understanding of the instructions.   The patient was advised to call  back or seek an in-person evaluation if the symptoms worsen or if the condition fails to improve as anticipated.  The above assessment and management plan was discussed with the patient. The patient verbalized understanding of and has agreed to the management plan. Patient is aware to call the clinic if they develop any new symptoms or if symptoms persist or worsen. Patient is aware when to return to the clinic for a follow-up visit. Patient educated on when it is appropriate to go to the emergency department.    I provided 15 minutes of time during this video encounter.   Chayla Shands [place] Thompson, DNP [facility] [address] [place], [place] [address] [phone] 12/27/2023

## 2023-12-27 NOTE — Telephone Encounter (Signed)
 Nena pt NTBS 30-d given 11/26/23

## 2024-01-03 ENCOUNTER — Other Ambulatory Visit: Payer: Self-pay | Admitting: Nurse Practitioner

## 2024-01-03 DIAGNOSIS — Z872 Personal history of diseases of the skin and subcutaneous tissue: Secondary | ICD-10-CM

## 2024-01-03 DIAGNOSIS — L709 Acne, unspecified: Secondary | ICD-10-CM

## 2024-01-03 MED ORDER — TRETINOIN 0.1 % EX CREA: TOPICAL_CREAM | Freq: Every day | CUTANEOUS | 1 refills | Status: DC

## 2024-01-03 NOTE — Progress Notes (Signed)
 Hi, I am writing to make sure my record is correct. I asked for a referral to [facility] for my nose spot (they have called and I'm getting that scheduled, thank you) and you asked if I needed refills for my Tretinoin  .1% and I do, however, I use tret for multiple reasons- acne and melasma. I didn't go into specifics with you because I figured I'd need the specifics for the dermatologist, where I have a clear history of that for decades. My insurance considers melasma as simply cosmetic and will not cover the tretinoin  for that diagnosis alone. That's why I specified in a follow up message immediately after our visit that it needs to be coded as both acne and melasma or it becomes far too expensive to afford and it is a medicine I need. I will not be able to use the prescription you sent in because it is only coded for melasma and was immediately denied coverage. Which is one of the reasons I hesitated when you asked if I needed a refill because unless you code that is for acne, my insurance denies me and it seemed like too much info to relay in a referral request. Could you send in an amended prescription or do I need to wait for the dermatologist to correctly code it and have that sent instead?    I resend it under both, it shows that you have a $4 copay.

## 2024-02-08 DIAGNOSIS — Z23 Encounter for immunization: Secondary | ICD-10-CM | POA: Diagnosis not present

## 2024-02-14 ENCOUNTER — Other Ambulatory Visit: Payer: Self-pay | Admitting: Physical Medicine and Rehabilitation

## 2024-02-19 ENCOUNTER — Telehealth: Payer: Self-pay | Admitting: Pharmacy Technician

## 2024-02-19 NOTE — Telephone Encounter (Signed)
 Pharmacy Patient Advocate Encounter   Received notification from Fax that prior authorization for EMGALITY  120MG  is required/requested.   Insurance verification completed.   The patient is insured through [facility] MEDICAID .   Per test claim: PA required; PA submitted to above mentioned insurance via Latent Key/confirmation #/EOC [id number] Status is pending

## 2024-02-22 NOTE — Telephone Encounter (Signed)
 Pharmacy Patient Advocate Encounter  Received notification from [facility] that Prior Authorization for Emgality  120MG /ML auto-injectors (migraine) has been APPROVED from 02-21-2024 to 02-20-2025   PA #/Case ID/Reference #: [id number]

## 2024-03-04 ENCOUNTER — Encounter: Payer: Self-pay | Admitting: Neurology

## 2024-03-04 DIAGNOSIS — J3489 Other specified disorders of nose and nasal sinuses: Secondary | ICD-10-CM

## 2024-03-26 ENCOUNTER — Ambulatory Visit (INDEPENDENT_AMBULATORY_CARE_PROVIDER_SITE_OTHER): Admitting: Obstetrics and Gynecology

## 2024-03-26 ENCOUNTER — Other Ambulatory Visit (HOSPITAL_COMMUNITY)
Admission: RE | Admit: 2024-03-26 | Discharge: 2024-03-26 | Disposition: A | Discharge status: Home / Self Care | Source: Ambulatory Visit | Attending: Obstetrics and Gynecology | Admitting: Obstetrics and Gynecology

## 2024-03-26 ENCOUNTER — Encounter: Payer: Self-pay | Admitting: Obstetrics and Gynecology

## 2024-03-26 VITALS — BP 113/79 | HR 64 | Ht 62.75 in | Wt 142.0 lb

## 2024-03-26 DIAGNOSIS — Z30433 Encounter for removal and reinsertion of intrauterine contraceptive device: Secondary | ICD-10-CM | POA: Diagnosis not present

## 2024-03-26 DIAGNOSIS — Z113 Encounter for screening for infections with a predominantly sexual mode of transmission: Secondary | ICD-10-CM | POA: Diagnosis not present

## 2024-03-26 DIAGNOSIS — Z114 Encounter for screening for human immunodeficiency virus [HIV]: Secondary | ICD-10-CM

## 2024-03-26 DIAGNOSIS — Z01419 Encounter for gynecological examination (general) (routine) without abnormal findings: Secondary | ICD-10-CM

## 2024-03-26 DIAGNOSIS — Z13220 Encounter for screening for lipoid disorders: Secondary | ICD-10-CM

## 2024-03-26 DIAGNOSIS — Z1159 Encounter for screening for other viral diseases: Secondary | ICD-10-CM | POA: Diagnosis not present

## 2024-03-26 DIAGNOSIS — Z131 Encounter for screening for diabetes mellitus: Secondary | ICD-10-CM

## 2024-03-26 DIAGNOSIS — Z1231 Encounter for screening mammogram for malignant neoplasm of breast: Secondary | ICD-10-CM

## 2024-03-26 MED ORDER — LEVONORGESTREL 20 MCG/DAY IU IUD
1.0000 | INTRAUTERINE_SYSTEM | Freq: Once | INTRAUTERINE | Status: AC
  Administered 2024-03-26: 1 via INTRAUTERINE

## 2024-03-26 MED ORDER — ESTRADIOL 0.025 MG/24HR TD PTTW: 1.0000 | MEDICATED_PATCH | TRANSDERMAL | 12 refills | Status: DC

## 2024-03-26 MED ORDER — PREMARIN 0.625 MG/GM VA CREA: TOPICAL_CREAM | VAGINAL | 11 refills | Status: AC

## 2024-03-26 NOTE — Addendum Note (Signed)
 Addended by: ERIK FELTS on: 03/26/2024 12:59 PM   Modules accepted: Orders

## 2024-03-26 NOTE — Progress Notes (Signed)
    GYNECOLOGY OFFICE PROCEDURE NOTE  Casey Cox is a 48 y.o. G2P0020 here for IUD insertion. No GYN concerns.  Last pap smear was 2022 and was normal/HPV negative  IUD Removal & Insertion Procedure Note Procedure: IUD removal & reinsertion with Mirena UPT: n/a GC/CT testing: Offered and accepts  Patient identified.  Risks, benefits and alternatives of procedure were discussed including irregular bleeding, cramping, infection, malpositioning or misplacement of the IUD outside the uterus which may require further procedure such as laparoscopy. Also discussed >99% contraception efficacy, increased risk of ectopic pregnancy with failure of method.   Emphasized that this did not protect against STIs, condoms recommended during all sexual encounters. Consent signed. Time out performed.   Speculum inserted and cervix visualized. Cervix was prepped with betadine. An intracervical injection of 2cc of 1% lidocaine  was placed at 12 o'clock. A single tooth tenaculum was placed to visualize the cervical os. The strings of the IUD were grasped and pulled using Kelly forceps. The IUD was successfully removed in its entirety.   Cervix prepped with 3 swabs of betadine. Uterus sounded to 7.5 cm., os finder user, and IUD then inserted without difficulty per manufacturer's instructions and strings cut to 3 cm below cervical os and all instruments removed. Pt tolerated well with minimal pain and bleeding.   Discussed concerning signs/symptoms and to call if heavy bleeding, severe abdominal pain, or fever in the following 3 weeks. Manufacturer pamphlet/patient information given. Reviewed timing of efficacy for contraception and to use an alternative form of birth control until that time.  Kieth Carolin, MD Obstetrician & Gynecologist, [facility] for [employer], [facility]

## 2024-03-26 NOTE — Progress Notes (Signed)
 ANNUAL EXAM Patient name: MAYLI COVINGTON MRN [id number]  Date of birth: Jul 08, 1975 Chief Complaint:   New Patient (Initial Visit) and Annual Exam  History of Present Illness:   Casey Cox is a 48 y.o. G2P0020 with No LMP recorded. (Menstrual status: IUD). being seen today for a routine annual exam.  Current complaints:   Transfer of care for [facility]  History of endometriosis - has had 3 laparoscopies, interested in metformin or GLP1 for management of endo related inflammation Perimenopause- looking for someone to take over prescribing her MHT. Is on estradiol  patch, premarin cream, and androgel . Uses testosterone  for executive functioning (hx ADHD)  Last pap 2022. Results were: NILM w/ HRHPV negative. H/O abnormal pap: no Last mammogram: 04/18/23. Results were: normal. Family h/o breast cancer: yes grandmother & aunt Last colonoscopy: 2023. Results were: normal - benign polyps. Family h/o colorectal cancer: no HPV vaccine: completed     03/26/2024    8:32 AM 05/07/2023    1:30 PM 04/05/2023    8:25 AM  Depression screen PHQ 2/9  Decreased Interest 1 2 1   Down, Depressed, Hopeless 1 2 1   PHQ - 2 Score 2 4 2   Altered sleeping 2 2 3   Tired, decreased energy 1 2 1   Change in appetite 0 1 0  Feeling bad or failure about yourself  0 1 0  Trouble concentrating 0 1 1  Moving slowly or fidgety/restless 0 0 0  Suicidal thoughts 0 0 0  PHQ-9 Score 5 11 7   Difficult doing work/chores  Extremely dIfficult Somewhat difficult        03/26/2024    8:32 AM 04/05/2023    8:25 AM  GAD 7 : Generalized Anxiety Score  Nervous, Anxious, on Edge 2 2  Control/stop worrying 1 2  Worry too much - different things 1 2  Trouble relaxing 0 2  Restless 0 1  Easily annoyed or irritable 2 2  Afraid - awful might happen 0 1  Total GAD 7 Score 6 12  Anxiety Difficulty  Very difficult     Review of Systems:   Pertinent items are noted in HPI Denies any headaches, blurred  vision, fatigue, shortness of breath, chest pain, abdominal pain, abnormal vaginal discharge/itching/odor/irritation, problems with periods, bowel movements, urination, or intercourse unless otherwise stated above. Pertinent History Reviewed:  Reviewed past medical,surgical, social and family history.  Reviewed problem list, medications and allergies. Physical Assessment:   Vitals:   03/26/24 0819  BP: 113/79  Pulse: 64  Weight: 142 lb (64.4 kg)  Height: 5' 2.75 (1.594 m)  Body mass index is 25.36 kg/m.        Physical Examination:   General appearance - well appearing, and in no distress  Mental status - alert, oriented to person, place, and time  Chest - respiratory effort normal  Heart - normal peripheral perfusion  Breasts - breasts appear normal, no suspicious masses, no skin or nipple changes or axillary nodes  Pelvic - VULVA: normal appearing vulva with no masses, tenderness or lesions  VAGINA: normal appearing vagina with normal color and discharge, no lesions  CERVIX: normal appearing cervix without discharge or lesions, no CMT  UTERUS: uterus is felt to be normal size, shape, consistency and nontender   ADNEXA: No adnexal masses or tenderness noted.  Chaperone present for exam  No results found for this or any previous visit (from the past 24 hours).  Assessment & Plan:  1) Well-Woman Exam Mammogram: ordered  Colonoscopy: per GI Pap: Due 2027 Gardasil: Completed GC/CT: Collected HIV/HCV: Ordered  2) Perimenopause Reviewed that I do not prescribe testosterone  in perimenopause, but can refill estradiol  patch & vaginal estrogen. Using IUD for endometrial protection - replaced today without complication Will provide list of providers currently offering testosterone  to their patients  Notes endo flares with current estradiol  patch, will drop dose after discussing   3) IUD removal & reinsertion Uncomplicated procedure  Encounter for annual routine gynecological  examination -     MM 3D SCREENING MAMMOGRAM BILATERAL BREAST; Future -     Cervicovaginal ancillary only( [place]) -     HIV antibody (with reflex) -     Hepatitis C Antibody -     Hepatitis B Surface AntiGEN -     RPR -     Lipid panel -     HgB A1c  Encounter for screening mammogram for malignant neoplasm of breast -     MM 3D SCREENING MAMMOGRAM BILATERAL BREAST; Future  Screening examination for STI -     Cervicovaginal ancillary only( [place]) -     Hepatitis B Surface AntiGEN -     RPR  Encounter for HCV screening test for low risk patient -     Hepatitis C Antibody  Screening for HIV without presence of risk factors -     HIV antibody (with reflex)  Diabetes mellitus screening -     HgB A1c  Lipid screening -     Lipid panel  Other orders -     estradiol  (VIVELLE -DOT) 0.025 MG/24HR; Place 1 patch onto the skin 2 (two) times a week. -     conjugated estrogens (PREMARIN) vaginal cream; Apply a pea to finger tip sized amount vaginally twice per week  Follow-up: Return in about 1 year (around 03/26/2025) for annual exam or sooner as needed.  Kieth JAYSON Carolin, MD 03/26/2024 12:56 PM

## 2024-03-26 NOTE — Patient Instructions (Addendum)
 It was nice meeting you today! You will see your results in the MyChart app within 1 week.   Please send me a photo of your pap results in the MyChart app so I can update your [facility] chart.  I am also working to get you a list of providers who prescribe testosterone  and will send you a MyChart message as soon as I have it

## 2024-03-26 NOTE — Progress Notes (Signed)
 Pt is new for routine exam. Pt currently has IUD and would like to discuss replacement and perimenopause symptoms.  Pt states [facility]/2023 [facility] - normal [facility].   Pt would like refills on estradiol , androgel  and premarin.

## 2024-03-27 ENCOUNTER — Ambulatory Visit: Payer: Self-pay | Admitting: Obstetrics and Gynecology

## 2024-03-27 LAB — CERVICOVAGINAL ANCILLARY ONLY
Chlamydia: NEGATIVE
Comment: NEGATIVE
Comment: NORMAL
Neisseria Gonorrhea: NEGATIVE

## 2024-03-27 LAB — HIV ANTIBODY (ROUTINE TESTING W REFLEX): HIV Screen 4th Generation wRfx: NONREACTIVE

## 2024-03-27 LAB — LIPID PANEL
Chol/HDL Ratio: 2.1 ratio (ref 0.0–4.4)
Cholesterol, Total: 133 mg/dL (ref 100–199)
HDL: 64 mg/dL (ref >39)
LDL Chol Calc (NIH): 55 mg/dL (ref 0–99)
Triglycerides: 69 mg/dL (ref 0–149)
VLDL Cholesterol Cal: 14 mg/dL (ref 5–40)

## 2024-03-27 LAB — HEMOGLOBIN A1C
Est. average glucose Bld gHb Est-mCnc: 100 mg/dL
Hgb A1c MFr Bld: 5.1 % (ref 4.8–5.6)

## 2024-03-27 LAB — RPR: RPR Ser Ql: NONREACTIVE

## 2024-03-27 LAB — HEPATITIS B SURFACE ANTIGEN: Hepatitis B Surface Ag: NEGATIVE

## 2024-03-27 LAB — HEPATITIS C ANTIBODY: Hep C Virus Ab: NONREACTIVE

## 2024-04-03 ENCOUNTER — Other Ambulatory Visit: Payer: Self-pay | Admitting: *Deleted

## 2024-04-03 DIAGNOSIS — F39 Unspecified mood [affective] disorder: Secondary | ICD-10-CM

## 2024-04-03 DIAGNOSIS — F902 Attention-deficit hyperactivity disorder, combined type: Secondary | ICD-10-CM

## 2024-04-07 ENCOUNTER — Encounter: Payer: Self-pay | Admitting: Dermatology

## 2024-04-07 ENCOUNTER — Ambulatory Visit: Admitting: Dermatology

## 2024-04-07 DIAGNOSIS — D485 Neoplasm of uncertain behavior of skin: Secondary | ICD-10-CM

## 2024-04-07 DIAGNOSIS — D2239 Melanocytic nevi of other parts of face: Secondary | ICD-10-CM | POA: Diagnosis not present

## 2024-04-07 NOTE — Patient Instructions (Addendum)

## 2024-04-07 NOTE — Progress Notes (Signed)
   New Patient Visit   Subjective  Casey Cox is a 48 y.o. female who presents for the following: spot  Pt states growth present around 2 yrs. She has no hx of skin cancer nor family hx. She states that she has not treated the spots previously. She states that they are asymptomatic.   The following portions of the chart were reviewed this encounter and updated as appropriate: medications, allergies, medical history  Review of Systems:  No other skin or systemic complaints except as noted in HPI or Assessment and Plan.  Objective  Well appearing patient in no apparent distress; mood and affect are within normal limits.  A focused examination was performed of the following areas: nose  Relevant exam findings are noted in the Assessment and Plan.  Left Tip of Nose 2mm pearly pink papule   Assessment & Plan   Neoplasm- right nasal tip Exam: flesh colored papule - Will monitor for now NEOPLASM OF UNCERTAIN BEHAVIOR OF SKIN Left Tip of Nose Skin / nail biopsy Type of biopsy: tangential   Informed consent: discussed and consent obtained   Timeout: patient name, date of birth, surgical site, and procedure verified   Procedure prep:  Patient was prepped and draped in usual sterile fashion Prep type:  Isopropyl alcohol Anesthesia: the lesion was anesthetized in a standard fashion   Anesthetic:  1% lidocaine  w/ epinephrine  1-100,000 buffered w/ 8.4% NaHCO3 Hemostasis achieved with: aluminum chloride   Outcome: patient tolerated procedure well   Post-procedure details: sterile dressing applied and wound care instructions given   Dressing type: bandage    Specimen 1 - Surgical pathology Differential Diagnosis: R/O NMSC vs other  Check Margins: No  Return for TBSE with brenda.  I, Darice Smock, CMA, am acting as scribe for RUFUS CHRISTELLA HOLY, MD.   Documentation: I have reviewed the above documentation for accuracy and completeness, and I agree with the above.  RUFUS CHRISTELLA HOLY,  MD

## 2024-04-09 LAB — SURGICAL PATHOLOGY

## 2024-04-10 ENCOUNTER — Ambulatory Visit: Payer: Self-pay | Admitting: Dermatology

## 2024-04-18 ENCOUNTER — Ambulatory Visit
Admission: RE | Admit: 2024-04-18 | Discharge: 2024-04-18 | Disposition: A | Discharge status: Home / Self Care | Source: Ambulatory Visit | Attending: Obstetrics and Gynecology | Admitting: Obstetrics and Gynecology

## 2024-04-18 DIAGNOSIS — Z01419 Encounter for gynecological examination (general) (routine) without abnormal findings: Secondary | ICD-10-CM

## 2024-04-18 DIAGNOSIS — Z1231 Encounter for screening mammogram for malignant neoplasm of breast: Secondary | ICD-10-CM

## 2024-04-22 ENCOUNTER — Other Ambulatory Visit: Payer: Self-pay | Admitting: Obstetrics and Gynecology

## 2024-05-28 ENCOUNTER — Other Ambulatory Visit: Payer: Self-pay | Admitting: Nurse Practitioner

## 2024-05-28 DIAGNOSIS — L709 Acne, unspecified: Secondary | ICD-10-CM

## 2024-05-28 DIAGNOSIS — Z872 Personal history of diseases of the skin and subcutaneous tissue: Secondary | ICD-10-CM

## 2024-06-03 ENCOUNTER — Other Ambulatory Visit (HOSPITAL_COMMUNITY): Payer: Self-pay

## 2024-06-03 ENCOUNTER — Telehealth: Payer: Self-pay | Admitting: Pharmacy Technician

## 2024-06-03 NOTE — Telephone Encounter (Signed)
 Pharmacy Patient Advocate Encounter   Received notification from [facility] [address] that prior authorization for Tretinoin  0.1% cream is required/requested.   Insurance verification completed.   The patient is insured through [facility] MEDICAID.   Per test claim: PA required; PA started via CoverMyMeds. KEY [id number] . Waiting for clinical questions to populate.

## 2024-06-03 NOTE — Telephone Encounter (Signed)
Noted  -LS

## 2024-06-04 ENCOUNTER — Other Ambulatory Visit (HOSPITAL_COMMUNITY): Payer: Self-pay

## 2024-06-06 ENCOUNTER — Other Ambulatory Visit (HOSPITAL_COMMUNITY): Payer: Self-pay

## 2024-06-06 NOTE — Telephone Encounter (Signed)
 Pharmacy Patient Advocate Encounter  Received notification from [facility] MEDICAID that Prior Authorization for Tretinoin  0.1% cream  has been DENIED.  Full denial letter will be uploaded to the media tab. See denial reason below.    PA #/Case ID/Reference #: [id number]

## 2024-06-18 ENCOUNTER — Other Ambulatory Visit: Payer: Self-pay | Admitting: Obstetrics and Gynecology

## 2024-07-28 ENCOUNTER — Ambulatory Visit: Admitting: Physician Assistant

## 2024-08-05 ENCOUNTER — Ambulatory Visit: Admitting: Neurology
